# Patient Record
Sex: Female | Born: 1990
Health system: Southern US, Community
[De-identification: ages and names within clinical notes are randomized; demographics above are authoritative.]

## PROBLEM LIST (undated history)

## (undated) ENCOUNTER — Inpatient Hospital Stay (HOSPITAL_COMMUNITY): Payer: 59

## (undated) DIAGNOSIS — T7840XA Allergy, unspecified, initial encounter: Secondary | ICD-10-CM

## (undated) DIAGNOSIS — E119 Type 2 diabetes mellitus without complications: Secondary | ICD-10-CM

## (undated) HISTORY — DX: Allergy, unspecified, initial encounter: T78.40XA

## (undated) HISTORY — PX: TUBAL LIGATION: SHX77

## (undated) HISTORY — PX: FOOT SURGERY: SHX648

---
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
## 2008-03-21 ENCOUNTER — Emergency Department (HOSPITAL_COMMUNITY): Admission: EM | Admit: 2008-03-21 | Discharge: 2008-03-21 | Payer: Self-pay | Admitting: Emergency Medicine

## 2008-04-19 ENCOUNTER — Ambulatory Visit: Payer: Self-pay

## 2008-10-26 ENCOUNTER — Inpatient Hospital Stay: Payer: Self-pay

## 2010-03-21 ENCOUNTER — Encounter: Payer: Self-pay | Admitting: Family Medicine

## 2010-03-21 ENCOUNTER — Encounter: Payer: Self-pay | Admitting: Internal Medicine

## 2010-03-21 ENCOUNTER — Ambulatory Visit (INDEPENDENT_AMBULATORY_CARE_PROVIDER_SITE_OTHER): Payer: Commercial Managed Care - PPO | Admitting: Internal Medicine

## 2010-03-21 DIAGNOSIS — H66009 Acute suppurative otitis media without spontaneous rupture of ear drum, unspecified ear: Secondary | ICD-10-CM

## 2010-03-21 DIAGNOSIS — J309 Allergic rhinitis, unspecified: Secondary | ICD-10-CM | POA: Insufficient documentation

## 2010-03-24 NOTE — Letter (Signed)
Summary: Out of Work  [facility]  [address]   [place], [place] [address]   Phone: [phone]  Fax: [phone]    March 21, 2010   Employee:  SHONDELL FABEL Uhrich    To Whom It May Concern:   For Medical reasons, please excuse the above named employee from work for the following dates:  Start:   03/21/10  End:   03/23/10  If you need additional information, please feel free to contact our office.         Sincerely,    Dr. Vic Ripper

## 2010-03-24 NOTE — Assessment & Plan Note (Signed)
Summary: cold/jbb   Vital Signs:  Patient profile:   20 year old female Height:      65 inches Weight:      213 pounds BMI:     35.57 O2 Sat:      100 % on Room air Pulse rate:   88 / minute Resp:     16 per minute BP sitting:   107 / 71  (right arm)  Vitals Entered By: Adella Hare LPN (March 21, 2010 11:54 AM) CC: runny nose, colored drainage, cough,  chills, head congestion, right ear stopped up, symptoms x 1 week   CC:  runny nose, colored drainage, cough, chills, head congestion, right ear stopped up, and symptoms x 1 week.  Current Problems (verified): 1)  Peanut Allergy  (ICD-V15.01) 2)  Egg Allergy  (ICD-V15.03) 3)  Shellfish Allergy  (ICD-V15.04) 4)  Acut Suppratv Otitis Media w/o Spont Rup Eardrum  (ICD-382.00)  Current Medications (verified): 1)  Zyrtec Allergy 10 Mg Tabs (Cetirizine Hcl) .... As Needed  Allergies: 1)  * Shellfish 2)  * Eggs 3)  * Peanuts 4)  * Seafood  Past History:  Past Medical History: Allergic rhinitis  Past Surgical History: Foot surgery approx 2004  Family History: mother - healthy father- htn, prediabetes brother- healthy  Social History: Employed Single Current Smoker Alcohol use-no Drug use-no Regular exercise-no Smoking Status:  current Drug Use:  no Does Patient Exercise:  no  Review of Systems       The patient complains of fever, decreased hearing, and prolonged cough.  The patient denies anorexia, weight loss, weight gain, vision loss, hoarseness, chest pain, syncope, dyspnea on exertion, peripheral edema, headaches, hemoptysis, abdominal pain, melena, hematochezia, severe indigestion/heartburn, hematuria, incontinence, genital sores, muscle weakness, suspicious skin lesions, transient blindness, difficulty walking, depression, unusual weight change, abnormal bleeding, enlarged lymph nodes, angioedema, breast masses, and testicular masses.         right ear Eyes:  Denies blurring, discharge, double vision,  eye irritation, eye pain, halos, itching, light sensitivity, red eye, vision loss-1 eye, and vision loss-both eyes. ENT:  Complains of decreased hearing, earache, hoarseness, nasal congestion, postnasal drainage, and sore throat; denies difficulty swallowing, ear discharge, nosebleeds, ringing in ears, and sinus pressure; right ear. nasal purulence. Resp:  Complains of cough and sputum productive; denies chest discomfort, chest pain with inspiration, coughing up blood, excessive snoring, hypersomnolence, morning headaches, pleuritic, shortness of breath, and wheezing. GI:  Complains of diarrhea; denies abdominal pain, bloody stools, change in bowel habits, constipation, dark tarry stools, excessive appetite, gas, hemorrhoids, indigestion, loss of appetite, nausea, vomiting, vomiting blood, and yellowish skin color; gone today. MS:  Denies joint pain, joint redness, joint swelling, loss of strength, low back pain, mid back pain, muscle aches, muscle , cramps, muscle weakness, stiffness, and thoracic pain. Derm:  Denies changes in color of skin, changes in nail beds, dryness, excessive perspiration, flushing, hair loss, insect bite(s), itching, lesion(s), poor wound healing, and rash. Neuro:  Denies brief paralysis, difficulty with concentration, disturbances in coordination, falling down, headaches, inability to speak, memory loss, numbness, poor balance, seizures, sensation of room spinning, tingling, tremors, visual disturbances, and weakness. Psych:  Denies alternate hallucination ( auditory/visual), anxiety, depression, easily angered, easily tearful, irritability, mental problems, panic attacks, sense of great danger, suicidal thoughts/plans, thoughts of violence, unusual visions or sounds, and thoughts /plans of harming others. Allergy:  Complains of itching eyes, seasonal allergies, and sneezing; denies hives or rash and persistent infections.  Physical Exam  General:   Well-developed,well-nourished,in no acute distress; alert,appropriate and cooperative throughout examination. nasal voice Head:  Normocephalic and atraumatic without obvious abnormalities. No apparent alopecia or balding. Eyes:  No corneal or conjunctival inflammation noted. EOMI. Perrla. Vision grossly normal. Ears:  L ear normal, R TM erythema, and R TM retraction.   Nose:  mucosal erythema, mucosal edema, and airflow obstruction.   Mouth:  posterior lymphoid hypertrophy.   Neck:  No deformities, masses, or tenderness noted. Lungs:  Normal respiratory effort, chest expands symmetrically. Lungs are clear to auscultation, no crackles or wheezes. Abdomen:  soft and non-tender.   Neurologic:  No cranial nerve deficits noted. Station and gait are normal. Sensory, motor and coordinative functions appear intact. Skin:  Intact without suspicious rashes Cervical Nodes:  No lymphadenopathy noted Psych:  Cognition and judgment appear intact. Alert and cooperative with normal attention span and concentration. No apparent delusions, illusions, hallucinations   Complete Medication List: 1)  Zyrtec Allergy 10 Mg Tabs (Cetirizine hcl) .... As needed 2)  Amoxicillin 500 Mg Caps (Amoxicillin) .Marland Kitchen.. 1 by mouth three times a day. take it all  Patient Instructions: 1)  switch to zyrtec-D while sick. 2)  afrin generic 2 sprays in each nostril every 12 hours for 3 days maximum followed by 3 days of non-use. Continue cycle as needed to control nasal or ear congestion  3)  Take 650-1000mg  of Tylenol every 4-6 hours as needed for relief of pain or comfort of fever AVOID taking more than 4000mg   in a 24 hour period (can cause liver damage in higher doses). 4)  Take 400-600mg  of Ibuprofen (Advil, Motrin) with food every 4-6 hours as needed for relief of pain or comfort of fever. 5)  steam. fluids. rest. 6)  out of work for 2 days. Prescriptions: AMOXICILLIN 500 MG CAPS (AMOXICILLIN) 1 by mouth three times a day.  take it all  #30 x 0   Entered and Authorized by:   J. Juline Patch MD   Signed by:   Shela Commons. Juline Patch MD on 03/21/2010   Method used:   Electronically to        [facility] (retail)       [address]       [place], [place]  [address]       Ph: [phone]       Fax: [phone]   RxID:   [id number]

## 2010-05-12 LAB — BASIC METABOLIC PANEL
BUN: 5 mg/dL — ABNORMAL LOW (ref 6–23)
CO2: 26 mEq/L (ref 19–32)
Calcium: 9.5 mg/dL (ref 8.4–10.5)
Chloride: 102 mEq/L (ref 96–112)
Creatinine, Ser: 0.57 mg/dL (ref 0.4–1.2)
GFR calc Af Amer: >60 mL/min (ref >60)
GFR calc non Af Amer: >60 mL/min (ref >60)
Glucose, Bld: 78 mg/dL (ref 70–99)
Potassium: 3.8 mEq/L (ref 3.5–5.1)
Sodium: 135 mEq/L (ref 135–145)

## 2010-05-12 LAB — POCT CARDIAC MARKERS
CKMB, poc: <1 ng/mL — ABNORMAL LOW (ref 1.0–8.0)
Myoglobin, poc: 30.1 ng/mL (ref 12–200)
Troponin i, poc: <0.05 ng/mL (ref 0.00–0.09)

## 2010-05-12 LAB — PROTIME-INR
INR: 1 (ref 0.00–1.49)
Prothrombin Time: 13.7 seconds (ref 11.6–15.2)

## 2010-05-12 LAB — APTT: aPTT: 30 seconds (ref 24–37)

## 2010-05-12 LAB — CBC
HCT: 41.3 % (ref 36.0–46.0)
Hemoglobin: 13.9 g/dL (ref 12.0–15.0)
MCHC: 33.6 g/dL (ref 30.0–36.0)
MCV: 92.5 fL (ref 78.0–100.0)
Platelets: 260 10*3/uL (ref 150–400)
RBC: 4.47 MIL/uL (ref 3.87–5.11)
RDW: 13.6 % (ref 11.5–15.5)
WBC: 16.6 10*3/uL — ABNORMAL HIGH (ref 4.0–10.5)

## 2010-05-12 LAB — DIFFERENTIAL
Basophils Absolute: 0 10*3/uL (ref 0.0–0.1)
Basophils Relative: 0 % (ref 0–1)
Eosinophils Absolute: 0.1 10*3/uL (ref 0.0–0.7)
Eosinophils Relative: 1 % (ref 0–5)
Lymphocytes Relative: 12 % (ref 12–46)
Lymphs Abs: 2 10*3/uL (ref 0.7–4.0)
Monocytes Absolute: 0.9 10*3/uL (ref 0.1–1.0)
Monocytes Relative: 5 % (ref 3–12)
Neutro Abs: 13.6 10*3/uL — ABNORMAL HIGH (ref 1.7–7.7)
Neutrophils Relative %: 82 % — ABNORMAL HIGH (ref 43–77)

## 2010-05-12 LAB — HCG, SERUM, QUALITATIVE: Preg, Serum: POSITIVE — AB

## 2010-05-12 LAB — D-DIMER, QUANTITATIVE: D-Dimer, Quant: 0.49 ug/mL-FEU — ABNORMAL HIGH (ref 0.00–0.48)

## 2010-05-12 LAB — PREGNANCY, URINE: Preg Test, Ur: POSITIVE

## 2010-05-12 LAB — HCG, QUANTITATIVE, PREGNANCY: hCG, Beta Chain, Quant, S: 129498 m[IU]/mL — ABNORMAL HIGH (ref <5)

## 2011-02-11 ENCOUNTER — Emergency Department (HOSPITAL_COMMUNITY)
Admission: EM | Admit: 2011-02-11 | Discharge: 2011-02-11 | Disposition: A | Discharge status: Home / Self Care | Payer: 59 | Attending: Emergency Medicine | Admitting: Emergency Medicine

## 2011-02-11 ENCOUNTER — Encounter (HOSPITAL_COMMUNITY): Payer: Self-pay | Admitting: Emergency Medicine

## 2011-02-11 DIAGNOSIS — M545 Low back pain, unspecified: Secondary | ICD-10-CM | POA: Insufficient documentation

## 2011-02-11 DIAGNOSIS — X503XXA Overexertion from repetitive movements, initial encounter: Secondary | ICD-10-CM | POA: Insufficient documentation

## 2011-02-11 DIAGNOSIS — N390 Urinary tract infection, site not specified: Secondary | ICD-10-CM | POA: Insufficient documentation

## 2011-02-11 LAB — URINALYSIS, ROUTINE W REFLEX MICROSCOPIC
Bilirubin Urine: NEGATIVE
Glucose, UA: NEGATIVE mg/dL
Ketones, ur: NEGATIVE mg/dL
Nitrite: NEGATIVE
Specific Gravity, Urine: >1.03 — ABNORMAL HIGH (ref 1.005–1.030)
Urobilinogen, UA: 0.2 mg/dL (ref 0.0–1.0)
pH: 5.5 (ref 5.0–8.0)

## 2011-02-11 LAB — URINE CULTURE
Colony Count: 30000
Culture  Setup Time: 201301171210

## 2011-02-11 LAB — URINE MICROSCOPIC-ADD ON

## 2011-02-11 LAB — POCT PREGNANCY, URINE: Preg Test, Ur: NEGATIVE

## 2011-02-11 MED ORDER — CEPHALEXIN 500 MG PO CAPS
500.0000 mg | ORAL_CAPSULE | Freq: Once | ORAL | Status: AC
  Administered 2011-02-11: 500 mg via ORAL
  Filled 2011-02-11: qty 1

## 2011-02-11 MED ORDER — CEPHALEXIN 500 MG PO CAPS: 500.0000 mg | ORAL_CAPSULE | Freq: Four times a day (QID) | ORAL | Status: AC

## 2011-02-11 NOTE — ED Provider Notes (Signed)
Medical screening examination/treatment/procedure(s) were performed by non-physician practitioner and as supervising physician I was immediately available for consultation/collaboration.  Donnetta Hutching, MD 02/11/11 1453

## 2011-02-11 NOTE — ED Provider Notes (Signed)
History     CSN: [id number]  Arrival date & time 02/11/11  4098   First MD Initiated Contact with Patient 02/11/11 0820      Chief Complaint  Patient presents with  . Back Pain    (Consider location/radiation/quality/duration/timing/severity/associated sxs/prior treatment) HPI Comments: Patient c/o aching pain to her lower back that began yesterday.  She does some heavy lifting and bending at work and she thinks she may have "pulled something" in her back while lifting.  She denies numbness, incontinence, weakness or urinary sx's.  Patient is a 21 y.o. female presenting with back pain. The history is provided by the patient. No language interpreter was used.  Back Pain  This is a new problem. The current episode started yesterday. The problem occurs constantly. The problem has not changed since onset.The pain is associated with lifting heavy objects. The pain is present in the lumbar spine. The quality of the pain is described as aching. Radiates to: bilateral buttocks. The pain is moderate. The symptoms are aggravated by bending, twisting and certain positions. The pain is the same all the time. Pertinent negatives include no fever, no numbness, no abdominal pain, no abdominal swelling, no bowel incontinence, no perianal numbness, no bladder incontinence, no dysuria, no pelvic pain, no leg pain, no paresthesias, no paresis, no tingling and no weakness. She has tried NSAIDs for the symptoms. The treatment provided mild relief.    History reviewed. No pertinent past medical history.  Past Surgical History  Procedure Date  . Foot surgery   . Cesarean section     Family History  Problem Relation Age of Onset  . Diabetes Father   . Heart failure Other     History  Substance Use Topics  . Smoking status: Never Smoker   . Smokeless tobacco: Never Used  . Alcohol Use: No    OB History    Grav Para Term Preterm Abortions TAB SAB Ect Mult Living   1 1 1       1       Review of  Systems  Constitutional: Negative for fever.  Gastrointestinal: Negative for nausea, vomiting, abdominal pain and bowel incontinence.  Genitourinary: Negative for bladder incontinence, dysuria, frequency, hematuria, flank pain, vaginal bleeding, vaginal discharge, difficulty urinating, vaginal pain and pelvic pain.  Musculoskeletal: Positive for back pain.  Skin: Negative.   Neurological: Negative for tingling, weakness, numbness and paresthesias.  All other systems reviewed and are negative.    Allergies  Chocolate; Eggs or egg-derived products; Peanut-containing drug products; and Shellfish allergy  Home Medications  No current outpatient prescriptions on file.  BP 131/75  Pulse 98  Temp(Src) 98.4 F (36.9 C) (Oral)  Resp 16  Ht 5\' 5"  (1.651 m)  Wt 202 lb (91.627 kg)  BMI 33.61 kg/m2  SpO2 100%  Physical Exam  Nursing note and vitals reviewed. Constitutional: She is oriented to person, place, and time. She appears well-developed and well-nourished. No distress.  HENT:  Head: Normocephalic and atraumatic.  Mouth/Throat: Oropharynx is clear and moist. No oropharyngeal exudate.  Cardiovascular: Normal rate, regular rhythm and normal heart sounds.   No murmur heard. Pulmonary/Chest: Effort normal and breath sounds normal. No respiratory distress.  Abdominal: Soft. Normal appearance. She exhibits no distension. There is no tenderness. There is no rigidity, no rebound, no guarding and no CVA tenderness.  Musculoskeletal: She exhibits tenderness. She exhibits no edema.       Lumbar back: She exhibits tenderness. She exhibits normal range of motion, no  bony tenderness, no swelling, no edema, no deformity, no spasm and normal pulse.       Back:  Neurological: She is alert and oriented to person, place, and time. She has normal strength. No cranial nerve deficit or sensory deficit. Gait normal.  Reflex Scores:      Patellar reflexes are 2+ on the right side and 2+ on the left  side.      Achilles reflexes are 2+ on the right side and 2+ on the left side. Skin: Skin is warm and dry.    ED Course  Procedures (including critical care time)   Results for orders placed during the hospital encounter of 02/11/11  URINALYSIS, ROUTINE W REFLEX MICROSCOPIC      Component Value Range   Color, Urine YELLOW  YELLOW    APPearance CLEAR  CLEAR    Specific Gravity, Urine >1.030 (*) 1.005 - 1.030    pH 5.5  5.0 - 8.0    Glucose, UA NEGATIVE  NEGATIVE (mg/dL)   Hgb urine dipstick SMALL (*) NEGATIVE    Bilirubin Urine NEGATIVE  NEGATIVE    Ketones, ur NEGATIVE  NEGATIVE (mg/dL)   Protein, ur TRACE (*) NEGATIVE (mg/dL)   Urobilinogen, UA 0.2  0.0 - 1.0 (mg/dL)   Nitrite NEGATIVE  NEGATIVE    Leukocytes, UA MODERATE (*) NEGATIVE   POCT PREGNANCY, URINE      Component Value Range   Preg Test, Ur NEGATIVE    URINE MICROSCOPIC-ADD ON      Component Value Range   Squamous Epithelial / LPF FEW (*) RARE    WBC, UA 21-50  <3 (WBC/hpf)   Bacteria, UA MANY (*) RARE      Urine culture is pending   MDM     Pt has ttp of the lumbar paraspinal muscles.  No focal neuro deficits on exam.  Ambulates well.  Vitals are stable.  No fever, vomiting or CVA tenderness on exam to suggest pyleo.  I will start keflex and she agrees to return here if sx's worsen  Patient / Family / Caregiver understand and agree with initial ED impression and plan with expectations set for ED visit.   Pt stable in ED with no significant deterioration in condition.     Shane Melby L. Keeler, [place] 02/11/11 [phone]

## 2011-02-11 NOTE — ED Notes (Signed)
Patient c/o mid-lower back pain that started yesterday. Denies any known injuries.

## 2011-02-11 NOTE — ED Notes (Signed)
Patient with no complaints at this time. Respirations even and unlabored. Skin warm/dry. Discharge instructions reviewed with patient at this time. Patient given opportunity to voice concerns/ask questions. Patient discharged at this time and left Emergency Department with steady gait.   

## 2013-01-30 ENCOUNTER — Emergency Department: Payer: Self-pay | Admitting: Emergency Medicine

## 2013-01-30 LAB — BASIC METABOLIC PANEL
Anion Gap: 6 — ABNORMAL LOW (ref 7–16)
BUN: 9 mg/dL (ref 7–18)
Calcium, Total: 9.2 mg/dL (ref 8.5–10.1)
Chloride: 104 mmol/L (ref 98–107)
Co2: 27 mmol/L (ref 21–32)
Creatinine: 0.8 mg/dL (ref 0.60–1.30)
EGFR (African American): >60
EGFR (Non-African Amer.): >60
Glucose: 67 mg/dL (ref 65–99)
Osmolality: 271 (ref 275–301)
Potassium: 3.8 mmol/L (ref 3.5–5.1)
Sodium: 137 mmol/L (ref 136–145)

## 2013-01-30 LAB — CBC
HCT: 39 % (ref 35.0–47.0)
HGB: 13.2 g/dL (ref 12.0–16.0)
MCH: 30.3 pg (ref 26.0–34.0)
MCHC: 33.8 g/dL (ref 32.0–36.0)
MCV: 90 fL (ref 80–100)
Platelet: 273 10*3/uL (ref 150–440)
RBC: 4.35 10*6/uL (ref 3.80–5.20)
RDW: 13.7 % (ref 11.5–14.5)
WBC: 17.4 10*3/uL — ABNORMAL HIGH (ref 3.6–11.0)

## 2013-01-30 LAB — TROPONIN I: Troponin-I: <0.02 ng/mL

## 2013-01-31 LAB — URINALYSIS, COMPLETE
Bacteria: NONE SEEN
Bilirubin,UR: NEGATIVE
Glucose,UR: NEGATIVE mg/dL (ref 0–75)
Nitrite: NEGATIVE
Ph: 5 (ref 4.5–8.0)
Protein: NEGATIVE
RBC,UR: 116 /HPF (ref 0–5)
Specific Gravity: 1.024 (ref 1.003–1.030)
Squamous Epithelial: 2
WBC UR: 152 /HPF (ref 0–5)

## 2013-01-31 LAB — CK-MB
CK-MB: 0.8 ng/mL (ref 0.5–3.6)
CK-MB: <0.5 ng/mL — ABNORMAL LOW (ref 0.5–3.6)
CK-MB: <0.5 ng/mL — ABNORMAL LOW (ref 0.5–3.6)

## 2013-01-31 LAB — HCG, QUANTITATIVE, PREGNANCY: Beta Hcg, Quant.: <1 m[IU]/mL — ABNORMAL LOW

## 2013-01-31 LAB — TROPONIN I
Troponin-I: <0.02 ng/mL
Troponin-I: <0.02 ng/mL

## 2013-11-26 ENCOUNTER — Encounter (HOSPITAL_COMMUNITY): Payer: Self-pay | Admitting: Emergency Medicine

## 2014-05-18 NOTE — H&P (Signed)
PATIENT NAME:  Dara HoyerSHE, Dorice L MR#:  [id number] DATE OF BIRTH:  04-Sep-1990  DATE OF ADMISSION:  01/30/2013  REFERRING PHYSICIAN: Dr. Sharyn CreamerMark Quale.   PRIMARY CARE PHYSICIAN: Dr. Corky DownsJaved Masoud.   CHIEF COMPLAINT: Chest pain, shortness of breath.   HISTORY OF PRESENT ILLNESS: This is 24 year old female without significant past medical history who presents with complaints of pleuritic chest pain. The patient reported her symptoms started 3 days ago. Reports they were of sudden onset, intermittent episodes lasting for seconds. As well, it was accompanied by some mild shortness of breath. The patient reports it is worsened by deep inspiration and by ambulation as well. The patient reports she had recently an upper respiratory infection which currently is resolved. Upon presentation, the patient did not have any hypoxic but was tachycardic around 112. EKG did not show an acute findings. Her first troponin was negative. The patient had D-dimer sent which came back elevated at 2.92. The patient reports an allergy to SEAFOOD which is anaphylaxis, so the patient could not have CT chest with IV contrast. The patient had bilateral lower extremity venous Doppler which came back negative for DVT. The patient denies any hemoptysis, any recent long travel, any calf tenderness, but given the fact she had an elevated D-dimer, hospitalist service was requested to admit the patient to get a V/Q scan done in the a.m. to rule out PE. The patient had leukocytosis at 17.4 but she denies any fever, any dysuria, any cough, any productive sputum.   PAST MEDICAL HISTORY: None.   PAST SURGICAL HISTORY: Right foot surgery.   ALLERGIES:  1. Reports allergy to SEAFOOD including anaphylaxis.  2. IODINE.   HOME MEDICATIONS: Taking Tylenol as needed, Advil as needed. The patient is on contraception with Implanon.   FAMILY HISTORY: The patient denies any history of PE or blood clots in the family.   SOCIAL HISTORY: The patient  denies any smoking, any illicit drug use, any alcohol use.   REVIEW OF SYSTEMS:  CONSTITUTIONAL: Denies fever, chills, fatigue, weakness, weight gain, weight loss.  EYES: Denies blurry vision, double vision, inflammation, glaucoma.  EARS, NOSE, THROAT: Denies tinnitus, ear pain, hearing loss, epistaxis or discharge.  RESPIRATORY: Denies any cough, wheezing, hemoptysis. Complains of mild shortness of breath.  CARDIOVASCULAR: Complains of pleuritic chest pain. Denies any syncope, palpitation.  GASTROINTESTINAL: Denies nausea, vomiting, diarrhea, abdominal pain, hematemesis, melena, jaundice.  GENITOURINARY: Denies dysuria, hematuria, renal colic.  ENDOCRINE: Denies polyuria, polydipsia, heat or cold intolerance.  HEMATOLOGY: Denies anemia, easy bruising, bleeding diathesis.  INTEGUMENT: Denies acne, rash or skin lesion.  MUSCULOSKELETAL: Denies any swelling, gout, arthritis or cramps.  NEUROLOGIC: Denies CVA, TIA, seizures, headache.  PSYCHIATRIC: Denies anxiety, insomnia, bipolar disorder or schizophrenia.   PHYSICAL EXAMINATION:  VITAL SIGNS: Temperature 98.3, pulse 100, respiratory rate 20, blood pressure 91/48, saturating 99% on room air.  GENERAL: Obese female, looks comfortable, in no apparent distress.  HEENT: Head atraumatic, normocephalic. Pupils equal, reactive to light. Pink conjunctivae. Anicteric sclerae. Moist oral mucosa.  NECK: Supple. No thyromegaly. No JVD.  CHEST: Good air entry bilaterally. No wheezing, rales, rhonchi. Has tenderness to palpation in the right rib cage area.  CARDIOVASCULAR: S1, S2 heard. No rubs, murmurs or gallops.  ABDOMEN: Soft, nontender, nondistended. Bowel sounds present.  EXTREMITIES: No edema. No clubbing. No cyanosis. Pedal pulses felt bilaterally.  PSYCHIATRIC: Appropriate affect. Awake, alert x 3. Intact judgment and insight.  NEUROLOGIC: Cranial nerves grossly intact. Motor 5 out of 5. No focal deficits.  LYMPHATICS: No cervical  lymphadenopathy could be appreciated.  SKIN: Has a few skin tattoos but warm and dry. Normal skin turgor.   PERTINENT LABORATORIES: Urine pregnancy test negative. Glucose 67, BUN 9, creatinine 0.8, sodium 137, potassium 3.8, chloride 104, CO2 27. Troponin less than 0.02. White blood cells 17.4, hemoglobin 13.2, hematocrit 39, platelets 273. D-dimer is 2.92.   EKG showing normal sinus rhythm at 112 beats per minute.   Chest x-ray showing slight bibasilar atelectatic changes. Lungs otherwise clear.   Venous Doppler bilateral lower extremities: No DVT within the bilateral lower extremities.   ASSESSMENT AND PLAN:  1. Pleuritic chest pain: This is most likely related to musculoskeletal pain, but given the fact the patient has elevated D-dimer, will need to rule out pulmonary embolus. She cannot get CT chest angiogram due to her allergy, so she will need to have a V/Q scan done in the a.m. Meanwhile, will give her a treatment dose Lovenox x 1 of 1 mg/kg. Will continue to cycle her cardiac enzymes. As well, will monitor her on telemetry.  2. Leukocytosis: The patient is afebrile. No clear etiology. Chest x-ray does not show any pneumonia. Will check urinalysis.  3. Deep vein thrombosis prophylaxis: The patient received 1 treatment dose of Lovenox.   TOTAL TIME SPENT ON ADMISSION AND PATIENT CARE: 40 minutes.   ____________________________ Starleen Arms, MD dse:gb D: 01/31/2013 01:27:52 ET T: 01/31/2013 02:12:19 ET JOB#: [id number]  cc: Starleen Arms, MD, <Dictator> Tannon Peerson Teena Irani MD ELECTRONICALLY SIGNED 02/01/2013 14:27

## 2014-05-18 NOTE — Discharge Summary (Signed)
PATIENT NAME:  Cheryl Carpenter, Tionne L MR#:  [id number] DATE OF BIRTH:  07-25-1990  DATE OF ADMISSION:  01/30/2013 DATE OF DISCHARGE:  01/31/2013  ADMITTING PHYSICIAN: Starleen Armsawood S. Elgergawy, MD  DISCHARGING PHYSICIAN: Enid Baasadhika Myley Bahner, MD   PRIMARY CARE PHYSICIAN:  Corky DownsJaved Masoud, MD  DISCHARGE DIAGNOSES: 1.  Musculoskeletal chest pain/costochondritis.  2.  Nonspecific elevation of d-dimer.  3.  Asymptomatic pyuria.  4.  Obesity.   DISCHARGE HOME MEDICATIONS: Tylenol 650 mg q.4 hours as needed for pain.   DISCHARGE DIET: Regular diet.   DISCHARGE ACTIVITY: As tolerated.    FOLLOWUP INSTRUCTIONS:  PCP followup in 2 to 3 weeks.   LABS AND IMAGING STUDIES PRIOR TO DISCHARGE:  1.  WBC 17.4, hemoglobin 13.3, hematocrit 39.0, platelet count 273.  2.  Sodium 137, potassium 3.8, chloride 104, bicarb 27, BUN 9, creatinine 0.8, glucose 67 and calcium of 9.2.  3.  Urine pregnancy test is negative.  4.  Troponins are negative.  5.  Chest x-ray showing bibasilar atelectatic changes, otherwise clear lung fields.  6.  Ultrasound Doppler of the lower extremities bilaterally showing no evidence of any DVT. 7.  Urinalysis with 3+ leuk esterase but no bacteria seen at all.  8.  V/Q scan of the lungs showing normal study with no significant ventilation perfusion defects.   BRIEF HOSPITAL COURSE:  1.  Cheryl Carpenter is a 24 year old obese African American female with no significant past medical history, presents secondary to right-sided under the breast chest pain which was worsening by moving around. 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 She had troponins done which were normal in the ER x 3. EKG was normal and was thought to be either musculoskeletal chest pain versus PE as her d-dimer was slightly elevated. Because of her ALLERGY TO IODINE, the patient had a V/Q scan done which was completely normal and negative for any PE. The patient did not require any pain  medication in the ER other than 1 dose of Toradol when she initially presented and has remained pain-free. She was advised to take off from work for a day and was advised to use either Tylenol or Motrin over-the-counter for her pain.   2. Asymptomatic pyuria. She had increased WBCs and leuk esterase on urinalysis but no bacteria was noted. Because she was not symptomatic with any dysuria or discomfort, no antibiotics are being prescribed at this time. In case she develops any dysuria or fevers, UTI needs to be considered.   Her course has been otherwise uneventful in the hospital.   DISCHARGE CONDITION: Stable.   DISCHARGE DISPOSITION: Home.   TIME SPENT ON DISCHARGE: 45 minutes.   ____________________________ Enid Baasadhika Janari Gagner, MD rk:cs D: 01/31/2013 15:52:00 ET T: 01/31/2013 20:00:38 ET JOB#: [id number]  cc: Enid Baasadhika Velvia Mehrer, MD, <Dictator> Corky DownsJaved Masoud, MD Enid BaasADHIKA Tylan Briguglio MD ELECTRONICALLY SIGNED 02/06/2013 11:26

## 2015-01-31 DIAGNOSIS — Z308 Encounter for other contraceptive management: Secondary | ICD-10-CM | POA: Diagnosis not present

## 2015-03-12 ENCOUNTER — Encounter: Payer: Self-pay | Admitting: Emergency Medicine

## 2015-03-12 DIAGNOSIS — R61 Generalized hyperhidrosis: Secondary | ICD-10-CM | POA: Diagnosis not present

## 2015-03-12 DIAGNOSIS — Z3202 Encounter for pregnancy test, result negative: Secondary | ICD-10-CM | POA: Insufficient documentation

## 2015-03-12 DIAGNOSIS — R000 Tachycardia, unspecified: Secondary | ICD-10-CM | POA: Diagnosis not present

## 2015-03-12 DIAGNOSIS — N390 Urinary tract infection, site not specified: Secondary | ICD-10-CM | POA: Diagnosis not present

## 2015-03-12 DIAGNOSIS — J029 Acute pharyngitis, unspecified: Secondary | ICD-10-CM | POA: Diagnosis present

## 2015-03-12 LAB — POCT RAPID STREP A: Streptococcus, Group A Screen (Direct): NEGATIVE

## 2015-03-12 NOTE — ED Notes (Signed)
C/o sorethroat, chills, fever.  Onset of symptoms today.  Ibuprofen last taken at 1430 and tylenol at 2215.

## 2015-03-13 ENCOUNTER — Emergency Department
Admission: EM | Admit: 2015-03-13 | Discharge: 2015-03-13 | Discharge status: Against Medical Advice | Payer: 59 | Attending: Emergency Medicine | Admitting: Emergency Medicine

## 2015-03-13 DIAGNOSIS — R61 Generalized hyperhidrosis: Secondary | ICD-10-CM | POA: Diagnosis not present

## 2015-03-13 DIAGNOSIS — N390 Urinary tract infection, site not specified: Secondary | ICD-10-CM | POA: Diagnosis not present

## 2015-03-13 DIAGNOSIS — R000 Tachycardia, unspecified: Secondary | ICD-10-CM | POA: Diagnosis not present

## 2015-03-13 DIAGNOSIS — Z3202 Encounter for pregnancy test, result negative: Secondary | ICD-10-CM | POA: Diagnosis not present

## 2015-03-13 LAB — URINALYSIS COMPLETE WITH MICROSCOPIC (ARMC ONLY)
Bilirubin Urine: NEGATIVE
Glucose, UA: NEGATIVE mg/dL
Ketones, ur: NEGATIVE mg/dL
Nitrite: NEGATIVE
Protein, ur: 30 mg/dL — AB
Specific Gravity, Urine: 1.025 (ref 1.005–1.030)
pH: 5 (ref 5.0–8.0)

## 2015-03-13 LAB — BASIC METABOLIC PANEL
Anion gap: 8 (ref 5–15)
BUN: 8 mg/dL (ref 6–20)
CO2: 24 mmol/L (ref 22–32)
Calcium: 9 mg/dL (ref 8.9–10.3)
Chloride: 106 mmol/L (ref 101–111)
Creatinine, Ser: 0.71 mg/dL (ref 0.44–1.00)
GFR calc Af Amer: >60 mL/min (ref >60)
GFR calc non Af Amer: >60 mL/min (ref >60)
Glucose, Bld: 85 mg/dL (ref 65–99)
Potassium: 3.3 mmol/L — ABNORMAL LOW (ref 3.5–5.1)
Sodium: 138 mmol/L (ref 135–145)

## 2015-03-13 LAB — RAPID INFLUENZA A&B ANTIGENS
Influenza A (ARMC): NOT DETECTED
Influenza B (ARMC): NOT DETECTED

## 2015-03-13 LAB — CBC
HCT: 41.8 % (ref 35.0–47.0)
Hemoglobin: 13.8 g/dL (ref 12.0–16.0)
MCH: 30 pg (ref 26.0–34.0)
MCHC: 33 g/dL (ref 32.0–36.0)
MCV: 91 fL (ref 80.0–100.0)
Platelets: 212 10*3/uL (ref 150–440)
RBC: 4.59 MIL/uL (ref 3.80–5.20)
RDW: 13.5 % (ref 11.5–14.5)
WBC: 18.2 10*3/uL — ABNORMAL HIGH (ref 3.6–11.0)

## 2015-03-13 LAB — PREGNANCY, URINE: Preg Test, Ur: NEGATIVE

## 2015-03-13 LAB — LACTIC ACID, PLASMA: Lactic Acid, Venous: 0.6 mmol/L (ref 0.5–2.0)

## 2015-03-13 MED ORDER — ONDANSETRON HCL 4 MG/2ML IJ SOLN
4.0000 mg | Freq: Once | INTRAMUSCULAR | Status: AC
  Administered 2015-03-13: 4 mg via INTRAVENOUS

## 2015-03-13 MED ORDER — SODIUM CHLORIDE 0.9 % IV BOLUS (SEPSIS)
1000.0000 mL | Freq: Once | INTRAVENOUS | Status: AC
Start: 2015-03-13 — End: 2015-03-13
  Administered 2015-03-13: 1000 mL via INTRAVENOUS

## 2015-03-13 MED ORDER — ONDANSETRON 4 MG PO TBDP: 4.0000 mg | ORAL_TABLET | Freq: Four times a day (QID) | ORAL | Status: DC | PRN

## 2015-03-13 MED ORDER — CEPHALEXIN 500 MG PO CAPS: 500.0000 mg | ORAL_CAPSULE | Freq: Four times a day (QID) | ORAL | Status: DC

## 2015-03-13 MED ORDER — DEXTROSE 5 % IV SOLN
1.0000 g | Freq: Once | INTRAVENOUS | Status: AC
  Administered 2015-03-13: 1 g via INTRAVENOUS
  Filled 2015-03-13: qty 10

## 2015-03-13 MED ORDER — ONDANSETRON HCL 4 MG/2ML IJ SOLN
INTRAMUSCULAR | Status: AC
  Administered 2015-03-13: 4 mg via INTRAVENOUS
  Filled 2015-03-13: qty 2

## 2015-03-13 MED ORDER — IBUPROFEN 800 MG PO TABS
800.0000 mg | ORAL_TABLET | ORAL | Status: AC
  Administered 2015-03-13: 800 mg via ORAL
  Filled 2015-03-13: qty 1

## 2015-03-13 NOTE — ED Notes (Signed)
Pt left ama, form signed.  MD aware, patient encouraged to return with any problems.

## 2015-03-13 NOTE — ED Provider Notes (Signed)
[facility] Emergency Department Provider Note  ____________________________________________  Time seen: Approximately 1:28 AM  I have reviewed the triage vital signs and the nursing notes.   HISTORY  Chief Complaint Fever and Sore Throat    HPI Cheryl Carpenter is a 25 y.o. female percents for evaluation of fevers chills, slightly scratchy throat that started about 8:00 this morning. She did take Profen during the day, and then took a dose of Tylenol this evening around 8 PM.  Patient reports she continues to feel the scratchy throat, slight runny nose, slightly achy in the muscles, has a minimal dry cough but no shortness of breath. No chest pain. No nausea or vomiting. She does report fever and chills. Denies any urinary symptoms. Denies pregnancy. She feels as though she is coming out of the "virus". 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 He did receive a flu shot this year.   History reviewed. No pertinent past medical history.  Patient Active Problem List   Diagnosis Date Noted  . ACUT SUPPRATV OTITIS MEDIA W/O SPONT RUP EARDRUM 03/21/2010  . ALLERGIC RHINITIS 03/21/2010  . PEANUT ALLERGY 03/21/2010  . EGG ALLERGY 03/21/2010  . SHELLFISH ALLERGY 03/21/2010    Past Surgical History  Procedure Laterality Date  . Foot surgery    . Cesarean section      Current Outpatient Rx  Name  Route  Sig  Dispense  Refill  . cephALEXin (KEFLEX) 500 MG capsule   Oral   Take 1 capsule (500 mg total) by mouth 4 (four) times daily.   40 capsule   0   . ondansetron (ZOFRAN ODT) 4 MG disintegrating tablet   Oral   Take 1 tablet (4 mg total) by mouth every 6 (six) hours as needed for nausea or vomiting.   20 tablet   0     Allergies Chocolate; Eggs or egg-derived products; Peanut-containing drug products; and Shellfish allergy  Family History  Problem Relation Age of Onset  . Diabetes Father   . Heart failure Other     Social  History Social History  Substance Use Topics  . Smoking status: Never Smoker   . Smokeless tobacco: Never Used  . Alcohol Use: No    Review of Systems Constitutional: Fevers and chills Eyes: No visual changes. ENT: Slight scratchy throat Cardiovascular: Denies chest pain. Respiratory: Denies shortness of breath. Occasional dry cough. Gastrointestinal: No abdominal pain. Nausea and slight vomiting.  No diarrhea.  No constipation. Genitourinary: Some slight urgency. Denies pregnancy. Musculoskeletal: Negative for back pain. Skin: Negative for rash. Neurological: Negative for headaches, focal weakness or numbness.  10-point ROS otherwise negative.  ____________________________________________   PHYSICAL EXAM:  VITAL SIGNS: ED Triage Vitals  Enc Vitals Group     BP 03/12/15 2249 129/78 mmHg     Pulse Rate 03/12/15 2249 126     Resp 03/12/15 2249 18     Temp 03/12/15 2249 100.8 F (38.2 C)     Temp Source 03/12/15 2249 Oral     SpO2 03/12/15 2249 100 %     Weight 03/12/15 2249 220 lb (99.791 kg)     Height 03/12/15 2249 5\' 5"  (1.651 m)     Head Cir --      Peak Flow --      Pain Score 03/12/15 2250 3     Pain Loc --      Pain Edu? --      Excl. in GC? --  Constitutional: Alert and oriented. Mildly ill appearing, slightly diaphoretic but in no distress. She is very pleasant and amicable. Watching television in no distress. Eyes: Conjunctivae are normal. PERRL. EOMI. Head: Atraumatic. Nose: No congestion/rhinnorhea. Mouth/Throat: Mucous membranes are moist.  Oropharynx non-erythematous. No tonsillar injection or hypertrophy. No trouble swallowing. Neck: No stridor.  No meningismus. Cardiovascular: Tachycardic rate, regular rhythm. Grossly normal heart sounds.  Good peripheral circulation. (Of note patient does report to me that her heart rate is always elevated whenever she goes to see the doctor.) Respiratory: Normal respiratory effort.  No retractions. Lungs  CTAB. Gastrointestinal: Soft and nontender. Mild suprapubic discomfort. No pain at McBurney's point. No CVA tenderness. Musculoskeletal: No lower extremity tenderness nor edema.   Neurologic:  Normal speech and language. No gross focal neurologic deficits are appreciated. Skin:  Skin is warm, dry and intact. No rash noted. Psychiatric: Mood and affect are normal. Speech and behavior are normal.  ____________________________________________   LABS (all labs ordered are listed, but only abnormal results are displayed)  Labs Reviewed  URINALYSIS COMPLETEWITH MICROSCOPIC ([facility] ONLY) - Abnormal; Notable for the following:    Color, Urine YELLOW (*)    APPearance HAZY (*)    Hgb urine dipstick 2+ (*)    Protein, ur 30 (*)    Leukocytes, UA 3+ (*)    Bacteria, UA FEW (*)    Squamous Epithelial / LPF 0-5 (*)    All other components within normal limits  BASIC METABOLIC PANEL - Abnormal; Notable for the following:    Potassium 3.3 (*)    All other components within normal limits  CBC - Abnormal; Notable for the following:    WBC 18.2 (*)    All other components within normal limits  RAPID INFLUENZA A&B ANTIGENS ([facility] ONLY)  CULTURE, GROUP A STREP ([facility])  CULTURE, BLOOD (ROUTINE X 2)  CULTURE, BLOOD (ROUTINE X 2)  URINE CULTURE  LACTIC ACID, PLASMA  LACTIC ACID, PLASMA  PREGNANCY, URINE  POCT RAPID STREP A   ____________________________________________  EKG   ____________________________________________  RADIOLOGY   ____________________________________________   PROCEDURES  Procedure(s) performed: None  Critical Care performed: No  ____________________________________________   INITIAL IMPRESSION / ASSESSMENT AND PLAN / ED COURSE  Pertinent labs & imaging results that were available during my care of the patient were reviewed by me and considered in my medical decision making (see chart for details).  Patient presents for fever, nausea, vomiting and associated  infectious symptoms. She is notably tachycardic, and labs reveal leukocytosis and she has associated low-grade fever. The patient's urinalysis is concerning for possible urinary tract infection, based upon this I discussed with the patient and recommended strongly admission for exclusion of "sepsis" or "a life-threatening infection". The patient reports she feels much better, after discussing the benefits of hospitalization and my strong recommendation to stay to be admitted patient did refuse this. Though she does refuse, I will certainly provide her appropriate antibiotic prescription and Zofran. We discussed in detail risks and benefits of hospitalization and the patient will be discharged AMA. She does have a primary care doctor whom she'll follow up closely with.   Careful return precautions advised.  03/13/2015 at 3:37 AM:  The patient requested to leave.  I considered this to be leaving against medical advice. I personally discussed the following with them:  1)  That they currently had a medical condition of infection with fevers and I am concerned that they may have a "life-threatening bloodstream infection," sometimes referred to as "  sepsis".Marland Kitchen    2)  My proposed course of evaluation and treatment includes, but is not limited to,  admission for IV antibiotics, blood cultures and close monitoring and aggressive treatment for infection.  Benefits of staying include possible diagnosis or excluding of a bloodstream infection or an alternative serious condition such as sepsis, which if identified early would lead to appropriate intervention in a timely manner lessening the burden of disability and death.  3) Risks of leaving before this had been completed include: misdiagnosis, worsening illness leading up to and including prolonged or permanent disability or death.  Specific risks pertinent, but not all inclusive, of their current medical condition include but are not limited to death, overwhelming  infection.  I also discussed alternatives including additional fluids and observation in the emergency room, but the patient also refused this.  Despite this they stated they wanted to leave due to feeling much improved and not wishing to be hospitalized and refused further evaluation, treatment, or admission at this time. Again I strongly encouraged admission, but she appears to have the capacity after discussion of risks including death from bloodstream infection or severe viral infection.  They appeared clinically sober, were mentating appropriately, were free from distracting injury, had adequately controlled acute pain, appeared to have intact insight, judgment, and reason, and in my opinion had the capacity to make this decision.  Specifically, they were able to verbally state back in a coherent manner their current medical condition/current diagnosis, the proposed course of evaluation and/or treatment, and the risks, benefits, and alternatives of treatment versus leaving against medical advice.   They understand that they may return to seek medical attention here at ANY time they want.  I strongly advised them to return to the Emergency Department immediately if they experience any new or worsening symptoms that concern them, or simply if they reconsider continued evaluation and/or treatment as previously discussed.  This would be without any repercussions, though they understand they likely will need to wait again in the Emergency Department if other patients are in front of them, rather than being brought straight back.  They understood this is another advantage of staying, but still insisted upon leaving.  I recommended they follow-up with Dr. Harl Bowie (her Primary Doctor) at the earliest available opportunity/appointment for further evaluation and treatment.   The patient was discharged against medical advice.  They did accept written discharge  instructions.   ____________________________________________   FINAL CLINICAL IMPRESSION(S) / ED DIAGNOSES  Final diagnoses:  Acute urinary tract infection   Patient discharged AMA.   Sharyn Creamer, MD 03/13/15 [phone]

## 2015-03-13 NOTE — ED Notes (Signed)
Iv discontinued, pt left ama refuses further treatment or admission.

## 2015-03-13 NOTE — Discharge Instructions (Signed)
You have been seen in the Emergency Department (ED) today for a fever and nausea.  Your workup today suggests that you have a urinary tract infection (UTI). Your flu test was negative. As we discussed her blood work is concerning for possible infection that is overwhelming, dangerous, or potentially in the bloodstream. You've been offered admission to the hospital, but refused. I am glad that you are feeling better, though my strong recommendation is admission to rule out life-threatening infection such as "sepsis" or a "bloodstream infection".    Please take your antibiotic as prescribed and over-the-counter pain medication (Tylenol or Motrin) as needed, but no more than recommended on the label instructions.  Drink PLENTY of fluids.  Call your regular doctor right away this morning to schedule the next available appointment to follow up on todays ED visit, or return immediately to the ED if your pain worsens, you have decreased urine production, develop fever, persistent vomiting, decide to be reevaluated for admission (as offered today) or other symptoms that concern you.   Urinary Tract Infection Urinary tract infections (UTIs) can develop anywhere along your urinary tract. Your urinary tract is your body's drainage system for removing wastes and extra water. Your urinary tract includes two kidneys, two ureters, a bladder, and a urethra. Your kidneys are a pair of bean-shaped organs. Each kidney is about the size of your fist. They are located below your ribs, one on each side of your spine. CAUSES Infections are caused by microbes, which are microscopic organisms, including fungi, viruses, and bacteria. These organisms are so small that they can only be seen through a microscope. Bacteria are the microbes that most commonly cause UTIs. SYMPTOMS  Symptoms of UTIs may vary by age and gender of the patient and by the location of the infection. Symptoms in young women typically include a frequent and  intense urge to urinate and a painful, burning feeling in the bladder or urethra during urination. Older women and men are more likely to be tired, shaky, and weak and have muscle aches and abdominal pain. A fever may mean the infection is in your kidneys. Other symptoms of a kidney infection include pain in your back or sides below the ribs, nausea, and vomiting. DIAGNOSIS To diagnose a UTI, your caregiver will ask you about your symptoms. Your caregiver will also ask you to provide a urine sample. The urine sample will be tested for bacteria and white blood cells. White blood cells are made by your body to help fight infection. TREATMENT  Typically, UTIs can be treated with medication. Because most UTIs are caused by a bacterial infection, they usually can be treated with the use of antibiotics. The choice of antibiotic and length of treatment depend on your symptoms and the type of bacteria causing your infection. HOME CARE INSTRUCTIONS  If you were prescribed antibiotics, take them exactly as your caregiver instructs you. Finish the medication even if you feel better after you have only taken some of the medication.  Drink enough water and fluids to keep your urine clear or pale yellow.  Avoid caffeine, tea, and carbonated beverages. They tend to irritate your bladder.  Empty your bladder often. Avoid holding urine for long periods of time.  Empty your bladder before and after sexual intercourse.  After a bowel movement, women should cleanse from front to back. Use each tissue only once. SEEK MEDICAL CARE IF:   You have back pain.  You develop a fever.  Your symptoms do not begin  to resolve within 3 days. SEEK IMMEDIATE MEDICAL CARE IF:   You have severe back pain or lower abdominal pain.  You develop chills.  You have nausea or vomiting.  You have continued burning or discomfort with urination. MAKE SURE YOU:   Understand these instructions.  Will watch your  condition.  Will get help right away if you are not doing well or get worse.   This information is not intended to replace advice given to you by your health care provider. Make sure you discuss any questions you have with your health care provider.   Document Released: 10/21/2004 Document Revised: 10/02/2014 Document Reviewed: 02/19/2011 Elsevier Interactive Patient Education [employer]

## 2015-03-14 DIAGNOSIS — N390 Urinary tract infection, site not specified: Secondary | ICD-10-CM | POA: Diagnosis not present

## 2015-03-14 DIAGNOSIS — E8881 Metabolic syndrome: Secondary | ICD-10-CM | POA: Diagnosis not present

## 2015-03-14 DIAGNOSIS — R5381 Other malaise: Secondary | ICD-10-CM | POA: Diagnosis not present

## 2015-03-15 LAB — URINE CULTURE: Special Requests: NORMAL

## 2015-03-15 LAB — CULTURE, GROUP A STREP (THRC)

## 2015-03-18 DIAGNOSIS — E119 Type 2 diabetes mellitus without complications: Secondary | ICD-10-CM | POA: Diagnosis not present

## 2015-03-18 DIAGNOSIS — G479 Sleep disorder, unspecified: Secondary | ICD-10-CM | POA: Diagnosis not present

## 2015-03-18 DIAGNOSIS — N760 Acute vaginitis: Secondary | ICD-10-CM | POA: Diagnosis not present

## 2015-03-18 DIAGNOSIS — E8881 Metabolic syndrome: Secondary | ICD-10-CM | POA: Diagnosis not present

## 2015-03-18 LAB — CULTURE, BLOOD (ROUTINE X 2)
Culture: NO GROWTH
Culture: NO GROWTH

## 2015-04-26 DIAGNOSIS — M5440 Lumbago with sciatica, unspecified side: Secondary | ICD-10-CM | POA: Diagnosis not present

## 2015-04-26 DIAGNOSIS — N390 Urinary tract infection, site not specified: Secondary | ICD-10-CM | POA: Diagnosis not present

## 2015-04-26 DIAGNOSIS — N760 Acute vaginitis: Secondary | ICD-10-CM | POA: Diagnosis not present

## 2015-07-15 DIAGNOSIS — N760 Acute vaginitis: Secondary | ICD-10-CM | POA: Diagnosis not present

## 2015-07-15 DIAGNOSIS — G479 Sleep disorder, unspecified: Secondary | ICD-10-CM | POA: Diagnosis not present

## 2015-07-15 DIAGNOSIS — E119 Type 2 diabetes mellitus without complications: Secondary | ICD-10-CM | POA: Diagnosis not present

## 2015-07-15 DIAGNOSIS — N390 Urinary tract infection, site not specified: Secondary | ICD-10-CM | POA: Diagnosis not present

## 2015-08-12 DIAGNOSIS — N760 Acute vaginitis: Secondary | ICD-10-CM | POA: Diagnosis not present

## 2015-08-12 DIAGNOSIS — B370 Candidal stomatitis: Secondary | ICD-10-CM | POA: Diagnosis not present

## 2015-08-12 DIAGNOSIS — E119 Type 2 diabetes mellitus without complications: Secondary | ICD-10-CM | POA: Diagnosis not present

## 2015-08-12 DIAGNOSIS — A64 Unspecified sexually transmitted disease: Secondary | ICD-10-CM | POA: Diagnosis not present

## 2015-08-13 DIAGNOSIS — R300 Dysuria: Secondary | ICD-10-CM | POA: Diagnosis not present

## 2015-08-13 DIAGNOSIS — E1165 Type 2 diabetes mellitus with hyperglycemia: Secondary | ICD-10-CM | POA: Diagnosis not present

## 2015-08-13 DIAGNOSIS — N898 Other specified noninflammatory disorders of vagina: Secondary | ICD-10-CM | POA: Diagnosis not present

## 2015-08-13 DIAGNOSIS — R81 Glycosuria: Secondary | ICD-10-CM | POA: Diagnosis not present

## 2015-08-19 DIAGNOSIS — E119 Type 2 diabetes mellitus without complications: Secondary | ICD-10-CM | POA: Diagnosis not present

## 2015-08-19 DIAGNOSIS — N760 Acute vaginitis: Secondary | ICD-10-CM | POA: Diagnosis not present

## 2016-02-20 DIAGNOSIS — E8881 Metabolic syndrome: Secondary | ICD-10-CM | POA: Diagnosis not present

## 2016-02-20 DIAGNOSIS — E119 Type 2 diabetes mellitus without complications: Secondary | ICD-10-CM | POA: Diagnosis not present

## 2016-02-20 DIAGNOSIS — G479 Sleep disorder, unspecified: Secondary | ICD-10-CM | POA: Diagnosis not present

## 2016-02-20 DIAGNOSIS — N760 Acute vaginitis: Secondary | ICD-10-CM | POA: Diagnosis not present

## 2016-06-11 ENCOUNTER — Ambulatory Visit: Payer: Self-pay | Admitting: Certified Nurse Midwife

## 2016-07-25 HISTORY — PX: OTHER SURGICAL HISTORY: SHX169

## 2016-08-18 ENCOUNTER — Ambulatory Visit (INDEPENDENT_AMBULATORY_CARE_PROVIDER_SITE_OTHER): Payer: 59 | Admitting: Advanced Practice Midwife

## 2016-08-18 ENCOUNTER — Encounter: Payer: Self-pay | Admitting: Advanced Practice Midwife

## 2016-08-18 VITALS — BP 110/80 | HR 98 | Ht 65.0 in | Wt 218.0 lb

## 2016-08-18 DIAGNOSIS — Z01419 Encounter for gynecological examination (general) (routine) without abnormal findings: Secondary | ICD-10-CM

## 2016-08-18 DIAGNOSIS — Z124 Encounter for screening for malignant neoplasm of cervix: Secondary | ICD-10-CM

## 2016-08-18 NOTE — Addendum Note (Signed)
Addended by: Tresea MallGLEDHILL, Cortez Flippen on: 08/18/2016 01:48 PM   Modules accepted: Orders

## 2016-08-18 NOTE — Progress Notes (Addendum)
Patient ID: Cheryl Carpenter, female   DOB: 06-Feb-1990, 26 y.o.   MRN: [id number]     Gynecology Annual Exam  PCP: Corky DownsMasoud, Javed, MD  Chief Complaint:  Chief Complaint  Patient presents with  . Gynecologic Exam  . Nexplanon removal    History of Present Illness: Patient is a 26 y.o. G1P1001 presents for annual exam. The patient has no complaints today. She requests removal of Nexplanon today as she and her husband are planning a pregnancy.  LMP: No LMP recorded. Patient has had an implant. Postcoital Bleeding: no Dysmenorrhea: not applicable  The patient is sexually active. She currently uses Nexplanon for contraception. She denies dyspareunia.  The patient does perform self breast exams.  There is no notable family history of breast or ovarian cancer in her family.  The patient wears seatbelts: yes.   The patient has regular exercise: she admits regular activity but no cardio exercise.    The patient denies current symptoms of depression.    Review of Systems: Review of Systems  Constitutional: Negative.   HENT: Negative.   Eyes: Negative.   Respiratory: Negative.   Cardiovascular: Negative.   Gastrointestinal: Negative.   Genitourinary: Negative.   Musculoskeletal: Negative.   Skin: Negative.   Neurological: Negative.   Endo/Heme/Allergies: Negative.   Psychiatric/Behavioral: Negative.     Past Medical History:  History reviewed. Type 2 Diabetes Mellitus  Past Surgical History:  Past Surgical History:  Procedure Laterality Date  . CESAREAN SECTION    . FOOT SURGERY    . nexplanon removal  07/2016    Gynecologic History:  No LMP recorded. Patient has had an implant. Contraception: Nexplanon Last Pap: Results were: no abnormalities   Obstetric History: G1P1001  Family History:  Family History  Problem Relation Age of Onset  . Diabetes Father   . Heart failure Other     Social History:  Social History   Social History  . Marital status: Single    Spouse name: N/A  . Number of children: N/A  . Years of education: N/A   Occupational History  . Not on file.   Social History Main Topics  . Smoking status: Never Smoker  . Smokeless tobacco: Never Used  . Alcohol use No  . Drug use: No  . Sexual activity: Yes   Other Topics Concern  . Not on file   Social History Narrative  . No narrative on file    Allergies:  Allergies  Allergen Reactions  . Chocolate   . Eggs Or Egg-Derived Products   . Peanut-Containing Drug Products   . Shellfish Allergy     Medications: Prior to Admission medications   Medication Sig Start Date End Date Taking? Authorizing Provider  metFORMIN (GLUCOPHAGE) 500 MG tablet Take by mouth 2 (two) times daily with a meal.   Yes [provider]    Physical Exam Vitals: Blood pressure 110/80, pulse 98, height 5\' 5"  (1.651 m), weight 218 lb (98.9 kg).  General: NAD HEENT: normocephalic, anicteric Thyroid: no enlargement, no palpable nodules Pulmonary: No increased work of breathing, CTAB Cardiovascular: RRR, distal pulses 2+ Breast: Breast symmetrical, no tenderness, no palpable nodules or masses, no skin or nipple retraction present, no nipple discharge.  No axillary or supraclavicular lymphadenopathy. Abdomen: NABS, soft, non-tender, non-distended.  Umbilicus without lesions.  No hepatomegaly, splenomegaly or masses palpable. No evidence of hernia  Genitourinary:  External: Normal external female genitalia.  Normal urethral meatus, normal  Bartholin's and Skene's glands.  Vagina: Normal vaginal mucosa, no evidence of prolapse.    Cervix: Grossly normal in appearance, no bleeding, no CMT  Uterus: Non-enlarged, mobile, normal contour.    Adnexa: ovaries non-enlarged, no adnexal masses  Rectal: deferred  Lymphatic: no evidence of inguinal lymphadenopathy Extremities: no edema, erythema, or tenderness Neurologic: Grossly intact Psychiatric: mood appropriate, affect  full    Assessment: 26 y.o. G1P1001 Well woman  Plan: Problem List Items Addressed This Visit    None    Visit Diagnoses    Well woman exam with routine gynecological exam    -  Primary      1) STI screening was offered and declined  2) ASCCP guidelines and rational discussed.  Patient opts for every 2-3 years screening interval  3) Contraception - none following removal of Nexplanon- planning pregnancy. Increase preconception healthy lifestyle diet and exercise to try to get off anti diabetic medication  4) Routine healthcare maintenance including cholesterol, diabetes screening discussed Declines  5) Follow up 1 year for routine annual exam   Cheryl Carpenter, CNM        GYNECOLOGY PROCEDURE NOTE  Nexplanon removal discussed in detail.  Risks of infection, bleeding, nerve injury all reviewed.  Patient understands risks and desires to proceed.  Verbal consent obtained.  Patient is certain she wants the Nexplanon removed.  All questions answered.  Procedure: Patient placed in dorsal supine with left arm above head, elbow flexed at 90 degrees, arm resting on examination table.  Nexplanon identified without problems.  Betadine scrub x3.  1 ml of 1% lidocaine injected under Nexplanon device without problems.  Sterile gloves applied.  Small 0.5cm incision made at distal tip of Nexplanon device with 11 blade scalpel.  Nexplanon brought to incision and grasped with a small kelly clamp.  Nexplanon removed intact without problems.  Pressure applied to incision.  Hemostasis obtained.  Steri-strips applied, followed by bandage and compression dressing.  Patient tolerated procedure well.  No complications.   Assessment: 26 y.o. year old female now s/p uncomplicated Nexplanon removal.  Plan: 1.  Patient given post procedure precautions and asked to call for fever, chills, redness or drainage from her incision, bleeding from incision.  She understands she will likely have a small bruise  near site of removal and can remove bandage tomorrow and steri-strips in approximately 1 week.  2) Contraception: planning a pregnancy   Cheryl MallJane Demarkus Carpenter, CNM

## 2016-08-21 LAB — IGP, RFX APTIMA HPV ASCU: PAP Smear Comment: 0

## 2016-11-10 ENCOUNTER — Ambulatory Visit (INDEPENDENT_AMBULATORY_CARE_PROVIDER_SITE_OTHER): Payer: 59 | Admitting: Maternal Newborn

## 2016-11-10 ENCOUNTER — Encounter: Payer: Self-pay | Admitting: Maternal Newborn

## 2016-11-10 VITALS — BP 134/90 | Wt 215.0 lb

## 2016-11-10 DIAGNOSIS — O0991 Supervision of high risk pregnancy, unspecified, first trimester: Secondary | ICD-10-CM | POA: Insufficient documentation

## 2016-11-10 DIAGNOSIS — E119 Type 2 diabetes mellitus without complications: Secondary | ICD-10-CM | POA: Insufficient documentation

## 2016-11-10 DIAGNOSIS — E669 Obesity, unspecified: Secondary | ICD-10-CM

## 2016-11-10 DIAGNOSIS — N912 Amenorrhea, unspecified: Secondary | ICD-10-CM

## 2016-11-10 LAB — POCT URINE PREGNANCY: Preg Test, Ur: POSITIVE — AB

## 2016-11-10 NOTE — Progress Notes (Signed)
11/10/2016   Chief Complaint: Amenorrhea, positive home pregnancy test, desires prenatal care.  Transfer of Care Patient: no  History of Present Illness: Cheryl Carpenter is a 26 y.o. G2P1001 at [redacted]w[redacted]d based on Patient's last menstrual period on 08/30/2016 (approximate), with an Estimated Date of Delivery: 06/06/2017, with the above CC.   Her periods were: Absent with Nexplanon, one cycle since Nexplanon removed in July 2018. LMP: 08/30/2016. She was using no method when she conceived.  She has Positive signs or symptoms of nausea/vomiting of pregnancy, breast tenderness, fatigue. She has Negative signs or symptoms of miscarriage or preterm labor. She identifies Negative Zika risk factors for her and her partner On any different medications around the time she conceived/early pregnancy: No  History of varicella: Yes   ROS: A 12-point review of systems was performed and negative, except as stated in the above HPI.  OBGYN History: As per HPI. OB History  Gravida Para Term Preterm AB Living  2 1 1     1   SAB TAB Ectopic Multiple Live Births          1    # Outcome Date GA Lbr Len/2nd Weight Sex Delivery Anes PTL Lv  2 Current           1 Term 10/27/08 [redacted]w[redacted]d  8 lb (3.629 kg) F CS-LTranv  N LIV     Birth Comments: FETAL HEART RATE WENT UP AND STARTED RUNNING FEVER      Any issues with any prior pregnancies: no Any prior children are healthy, doing well, without any problems or issues: yes History of pap smears: Yes. Last pap smear 08/18/2016. Abnormal: no, NILM History of STIs: Yes   Past Medical History: No past medical history on file.  Past Surgical History: Past Surgical History:  Procedure Laterality Date  . CESAREAN SECTION    . FOOT SURGERY    . nexplanon removal  07/2016    Family History:  Family History  Problem Relation Age of Onset  . Diabetes Father   . Heart failure Other    She denies any female cancers, bleeding or blood clotting disorders.  She denies any  history of intellectual disability, birth defects or genetic disorders in her or the FOB's history  Social History:  Social History   Social History  . Marital status: Single    Spouse name: N/A  . Number of children: N/A  . Years of education: N/A   Occupational History  . Not on file.   Social History Main Topics  . Smoking status: Never Smoker  . Smokeless tobacco: Never Used  . Alcohol use No  . Drug use: No  . Sexual activity: Yes   Other Topics Concern  . Not on file   Social History Narrative  . No narrative on file   Any cats in the household: no  Allergy: Allergies  Allergen Reactions  . Chocolate   . Eggs Or Egg-Derived Products Other (See Comments)  . Peanut-Containing Drug Products   . Shellfish Allergy     Current Outpatient Medications:  Current Outpatient Prescriptions:  .  metFORMIN (GLUCOPHAGE) 500 MG tablet, Take by mouth 2 (two) times daily with a meal., Disp: , Rfl:    Physical Exam:   BP 134/90   Wt 215 lb (97.5 kg)   LMP 08/30/2016 (Approximate)   BMI 35.78 kg/m  Body mass index is 35.78 kg/m. Constitutional: Well nourished, well developed female in no acute distress.  Neck:  Supple, normal appearance,  and no thyromegaly  Cardiovascular: S1, S2 normal, no murmur, rub or gallop, regular rate and rhythm Respiratory:  Clear to auscultation bilateral. Normal respiratory effort Abdomen: positive bowel sounds and no masses, hernias; diffusely non tender to palpation, non distended Breasts: breasts appear normal, no suspicious masses, no skin or nipple changes or axillary nodes. Neuro/Psych:  Normal mood and affect.  Skin:  Warm and dry.  Lymphatic:  No inguinal lymphadenopathy.   Pelvic exam: is not limited by body habitus External genitalia, Bartholin's glands, Urethra, Skene's glands: within normal limits Vagina: within normal limits and with no blood in the vault  Cervix: normal appearing cervix without discharge or lesions,  closed/long/high Uterus:  enlarged: consistent with pregnancy, normal contour Adnexa:  no mass, fullness, tenderness  Assessment: Cheryl Carpenter is a 26 y.o. G2P1001 presenting for prenatal care.  Plan:  1) Avoid alcoholic beverages. 2) Patient encouraged not to smoke.  3) Discontinue the use of all non-medicinal drugs and chemicals.  4) Take prenatal vitamins daily. Samples given. 5) Seatbelt use advised. 6) Nutrition, food safety (fish, cheese advisories, and high nitrite foods) and exercise discussed. 7) Hospital and practice style [facility] discussed.  8) Patient is asked about travel to areas at risk for the Zika virus, and counseled to avoid travel and exposure to mosquitoes or sexual partners who may have themselves been exposed to the virus.   9) [facility] advised. 10) Genetic Screening, such as with 1st Trimester Screening, cell free fetal DNA, AFP testing, and Ultrasound, as well as with amniocentesis and CVS as appropriate, is discussed with patient. She plans to have genetic testing this pregnancy.  Patient did not have diabetes in current problem list, but stated that she was "borderline" and therefore prescribed metformin. Records review showed a Hgb A1C of 10.2 on 08/13/2015. Added DM II to problem list.  Lab orders in system, to be drawn next visit as patient did not have time for early 1 hour glucose today. Also needs UDS as insufficient amount collected this time.  GC and urine culture done today.  Problem list reviewed and updated.  Return in about 2 days (around 11/12/2016) for ROB following ultrasound.  Marcelyn BruinsJacelyn Remell Giaimo, CNM [facility], [facility] 11/10/2016  7:50 PM

## 2016-11-10 NOTE — Patient Instructions (Signed)
First Trimester of Pregnancy The first trimester of pregnancy is from week 1 until the end of week 13 (months 1 through 3). A week after a sperm fertilizes an egg, the egg will implant on the wall of the uterus. This embryo will begin to develop into a baby. Genes from you and your partner will form the baby. The female genes will determine whether the baby will be a boy or a girl. At 6-8 weeks, the eyes and face will be formed, and the heartbeat can be seen on ultrasound. At the end of 12 weeks, all the baby's organs will be formed. Now that you are pregnant, you will want to do everything you can to have a healthy baby. Two of the most important things are to get good prenatal care and to follow your health care provider's instructions. Prenatal care is all the medical care you receive before the baby's birth. This care will help prevent, find, and treat any problems during the pregnancy and childbirth. Body changes during your first trimester Your body goes through many changes during pregnancy. The changes vary from woman to woman.  You may gain or lose a couple of pounds at first.  You may feel sick to your stomach (nauseous) and you may throw up (vomit). If the vomiting is uncontrollable, call your health care provider.  You may tire easily.  You may develop headaches that can be relieved by medicines. All medicines should be approved by your health care provider.  You may urinate more often. Painful urination may mean you have a bladder infection.  You may develop heartburn as a result of your pregnancy.  You may develop constipation because certain hormones are causing the muscles that push stool through your intestines to slow down.  You may develop hemorrhoids or swollen veins (varicose veins).  Your breasts may begin to grow larger and become tender. Your nipples may stick out more, and the tissue that surrounds them (areola) may become darker.  Your gums may bleed and may be  sensitive to brushing and flossing.  Dark spots or blotches (chloasma, mask of pregnancy) may develop on your face. This will likely fade after the baby is born.  Your menstrual periods will stop.  You may have a loss of appetite.  You may develop cravings for certain kinds of food.  You may have changes in your emotions from day to day, such as being excited to be pregnant or being concerned that something may go wrong with the pregnancy and baby.  You may have more vivid and strange dreams.  You may have changes in your hair. These can include thickening of your hair, rapid growth, and changes in texture. Some women also have hair loss during or after pregnancy, or hair that feels dry or thin. Your hair will most likely return to normal after your baby is born.  What to expect at prenatal visits During a routine prenatal visit:  You will be weighed to make sure you and the baby are growing normally.  Your blood pressure will be taken.  Your abdomen will be measured to track your baby's growth.  The fetal heartbeat will be listened to between weeks 10 and 14 of your pregnancy.  Test results from any previous visits will be discussed.  Your health care provider may ask you:  How you are feeling.  If you are feeling the baby move.  If you have had any abnormal symptoms, such as leaking fluid, bleeding, severe headaches,   or abdominal cramping.  If you are using any tobacco products, including cigarettes, chewing tobacco, and electronic cigarettes.  If you have any questions.  Other tests that may be performed during your first trimester include:  Blood tests to find your blood type and to check for the presence of any previous infections. The tests will also be used to check for low iron levels (anemia) and protein on red blood cells (Rh antibodies). Depending on your risk factors, or if you previously had diabetes during pregnancy, you may have tests to check for high blood  sugar that affects pregnant women (gestational diabetes).  Urine tests to check for infections, diabetes, or protein in the urine.  An ultrasound to confirm the proper growth and development of the baby.  Fetal screens for spinal cord problems (spina bifida) and Down syndrome.  HIV (human immunodeficiency virus) testing. Routine prenatal testing includes screening for HIV, unless you choose not to have this test.  You may need other tests to make sure you and the baby are doing well.  Follow these instructions at home: Medicines  Follow your health care provider's instructions regarding medicine use. Specific medicines may be either safe or unsafe to take during pregnancy.  Take a prenatal vitamin that contains at least 600 micrograms (mcg) of folic acid.  If you develop constipation, try taking a stool softener if your health care provider approves. Eating and drinking  Eat a balanced diet that includes fresh fruits and vegetables, whole grains, good sources of protein such as meat, eggs, or tofu, and low-fat dairy. Your health care provider will help you determine the amount of weight gain that is right for you.  Avoid raw meat and uncooked cheese. These carry germs that can cause birth defects in the baby.  Eating four or five small meals rather than three large meals a day may help relieve nausea and vomiting. If you start to feel nauseous, eating a few soda crackers can be helpful. Drinking liquids between meals, instead of during meals, also seems to help ease nausea and vomiting.  Limit foods that are high in fat and processed sugars, such as fried and sweet foods.  To prevent constipation: ? Eat foods that are high in fiber, such as fresh fruits and vegetables, whole grains, and beans. ? Drink enough fluid to keep your urine clear or pale yellow. Activity  Exercise only as directed by your health care provider. Most women can continue their usual exercise routine during  pregnancy. Try to exercise for 30 minutes at least 5 days a week. Exercising will help you: ? Control your weight. ? Stay in shape. ? Be prepared for labor and delivery.  Experiencing pain or cramping in the lower abdomen or lower back is a good sign that you should stop exercising. Check with your health care provider before continuing with normal exercises.  Try to avoid standing for long periods of time. Move your legs often if you must stand in one place for a long time.  Avoid heavy lifting.  Wear low-heeled shoes and practice good posture.  You may continue to have sex unless your health care provider tells you not to. Relieving pain and discomfort  Wear a good support bra to relieve breast tenderness.  Take warm sitz baths to soothe any pain or discomfort caused by hemorrhoids. Use hemorrhoid cream if your health care provider approves.  Rest with your legs elevated if you have leg cramps or low back pain.  If you develop   varicose veins in your legs, wear support hose. Elevate your feet for 15 minutes, 3-4 times a day. Limit salt in your diet. Prenatal care  Schedule your prenatal visits by the twelfth week of pregnancy. They are usually scheduled monthly at first, then more often in the last 2 months before delivery.  Write down your questions. Take them to your prenatal visits.  Keep all your prenatal visits as told by your health care provider. This is important. Safety  Wear your seat belt at all times when driving.  Make a list of emergency phone numbers, including numbers for family, friends, the hospital, and police and fire departments. General instructions  Ask your health care provider for a referral to a local prenatal education class. Begin classes no later than the beginning of month 6 of your pregnancy.  Ask for help if you have counseling or nutritional needs during pregnancy. Your health care provider can offer advice or refer you to specialists for help  with various needs.  Do not use hot tubs, steam rooms, or saunas.  Do not douche or use tampons or scented sanitary pads.  Do not cross your legs for long periods of time.  Avoid cat litter boxes and soil used by cats. These carry germs that can cause birth defects in the baby and possibly loss of the fetus by miscarriage or stillbirth.  Avoid all smoking, herbs, alcohol, and medicines not prescribed by your health care provider. Chemicals in these products affect the formation and growth of the baby.  Do not use any products that contain nicotine or tobacco, such as cigarettes and e-cigarettes. If you need help quitting, ask your health care provider. You may receive counseling support and other resources to help you quit.  Schedule a dentist appointment. At home, brush your teeth with a soft toothbrush and be gentle when you floss. Contact a health care provider if:  You have dizziness.  You have mild pelvic cramps, pelvic pressure, or nagging pain in the abdominal area.  You have persistent nausea, vomiting, or diarrhea.  You have a bad smelling vaginal discharge.  You have pain when you urinate.  You notice increased swelling in your face, hands, legs, or ankles.  You are exposed to fifth disease or chickenpox.  You are exposed to German measles (rubella) and have never had it. Get help right away if:  You have a fever.  You are leaking fluid from your vagina.  You have spotting or bleeding from your vagina.  You have severe abdominal cramping or pain.  You have rapid weight gain or loss.  You vomit blood or material that looks like coffee grounds.  You develop a severe headache.  You have shortness of breath.  You have any kind of trauma, such as from a fall or a car accident. Summary  The first trimester of pregnancy is from week 1 until the end of week 13 (months 1 through 3).  Your body goes through many changes during pregnancy. The changes vary from  woman to woman.  You will have routine prenatal visits. During those visits, your health care provider will examine you, discuss any test results you may have, and talk with you about how you are feeling. This information is not intended to replace advice given to you by your health care provider. Make sure you discuss any questions you have with your health care provider. Document Released: 01/05/2001 Document Revised: 12/24/2015 Document Reviewed: 12/24/2015 Elsevier Interactive Patient Education  2017 Elsevier   Inc.  

## 2016-11-12 ENCOUNTER — Other Ambulatory Visit: Payer: Self-pay | Admitting: Maternal Newborn

## 2016-11-12 ENCOUNTER — Ambulatory Visit (INDEPENDENT_AMBULATORY_CARE_PROVIDER_SITE_OTHER): Payer: 59

## 2016-11-12 ENCOUNTER — Ambulatory Visit (INDEPENDENT_AMBULATORY_CARE_PROVIDER_SITE_OTHER): Payer: 59 | Admitting: Advanced Practice Midwife

## 2016-11-12 VITALS — BP 130/90 | Wt 217.0 lb

## 2016-11-12 DIAGNOSIS — O0991 Supervision of high risk pregnancy, unspecified, first trimester: Secondary | ICD-10-CM

## 2016-11-12 DIAGNOSIS — E119 Type 2 diabetes mellitus without complications: Secondary | ICD-10-CM

## 2016-11-12 DIAGNOSIS — Z362 Encounter for other antenatal screening follow-up: Secondary | ICD-10-CM | POA: Diagnosis not present

## 2016-11-12 DIAGNOSIS — Z3A01 Less than 8 weeks gestation of pregnancy: Secondary | ICD-10-CM

## 2016-11-12 NOTE — Progress Notes (Signed)
Routine Prenatal Care Visit  Subjective  Cheryl HoyerBrittany L Delong is a [age] y.o. G2P1001 at 6284w6d being seen today for ongoing prenatal care.  She is currently monitored for the following issues for this high-risk pregnancy and has ACUT SUPPRATV OTITIS MEDIA W/O SPONT RUP EARDRUM; ALLERGIC RHINITIS; PEANUT ALLERGY; EGG ALLERGY; SHELLFISH ALLERGY; Diabetes mellitus, type 2 (HCC); Supervision of high risk pregnancy, antepartum, first trimester; and Obesity (BMI 30.0-34.9) on her problem list.  ----------------------------------------------------------------------------------- Patient reports no complaints.  She is taking Metformin for DM II and checking her blood sugar fasting and after meals. She states that her BS is usually wnl.  Denies leaking of fluid or vaginal bleeding or cramping.  ----------------------------------------------------------------------------------- The following portions of the patient's history were reviewed and updated as appropriate: allergies, current medications, past family history, past medical history, past social history, past surgical history and problem list. Problem list updated.   Objective  Blood pressure 130/90, weight 217 lb (98.4 kg), last menstrual period 08/30/2016. Pregravid weight 210 lb (95.3 kg) Total Weight Gain 7 lb (3.175 kg) Urinalysis: Urine Protein: Negative Urine Glucose: Negative  Fetal Status: dating u/s today: 9484w6d by u/s. EDD adjusted  General:  Alert, oriented and cooperative. Patient is in no acute distress.  Skin: Skin is warm and dry. No rash noted.   Cardiovascular: Normal heart rate noted  Respiratory: Normal respiratory effort, no problems with respiration noted  Abdomen: Soft, gravid, appropriate for gestational age.       Pelvic:  Cervical exam deferred        Extremities: Normal range of motion.     Mental Status: Normal mood and affect. Normal behavior. Normal judgment and thought content.   Assessment   26 y.o. G2P1001 at 6284w6d by   07/02/2017, by Ultrasound presenting for routine prenatal visit  Plan   SECOND Problems (from 11/10/16 to present)    Problem Noted Resolved   Diabetes mellitus, type 2 (HCC) 11/10/2016 by Oswaldo ConroySchmid, Jacelyn Y, CNM No   Supervision of high risk pregnancy, antepartum, first trimester 11/10/2016 by Oswaldo ConroySchmid, Jacelyn Y, CNM No   Overview Signed 11/10/2016  7:47 PM by Oswaldo ConroySchmid, Jacelyn Y, CNM    Clinic [facility] Prenatal Labs  Dating  Blood type:     Genetic Screen 1 Screen:    AFP:     Quad:     NIPS: Antibody:   Anatomic US  Rubella:   Varicella:    GTT Early:               Third trimester:  RPR:     Rhogam  HBsAg:     TDaP vaccine                       Flu Shot: HIV:     Baby Food                                GBS:   Contraception  Pap:  CBB     CS/VBAC  DM II on Metformin  Support Person                  Preterm labor symptoms and general obstetric precautions including but not limited to vaginal bleeding, contractions, leaking of fluid and fetal movement were reviewed in detail with the patient. Please refer to After Visit Summary for other counseling recommendations.   Return for NOB labs and Hgb A1C/ rob.  Tresea MallJane Willam Munford,  CNM  11/12/2016 4:35 PM

## 2016-11-13 LAB — URINE CULTURE: Organism ID, Bacteria: NO GROWTH

## 2016-11-13 LAB — GC/CHLAMYDIA PROBE AMP
Chlamydia trachomatis, NAA: NEGATIVE
Neisseria gonorrhoeae by PCR: NEGATIVE

## 2016-11-19 NOTE — Addendum Note (Signed)
Addended by: Tresea MallGLEDHILL, Haileigh Pitz on: 11/19/2016 04:31 PM   Modules accepted: Orders

## 2016-12-13 ENCOUNTER — Ambulatory Visit (INDEPENDENT_AMBULATORY_CARE_PROVIDER_SITE_OTHER): Payer: 59 | Admitting: Certified Nurse Midwife

## 2016-12-13 ENCOUNTER — Other Ambulatory Visit: Payer: Self-pay | Admitting: Maternal Newborn

## 2016-12-13 ENCOUNTER — Other Ambulatory Visit: Payer: 59

## 2016-12-13 ENCOUNTER — Encounter: Payer: Self-pay | Admitting: Certified Nurse Midwife

## 2016-12-13 VITALS — BP 120/80 | Wt 224.0 lb

## 2016-12-13 DIAGNOSIS — O24311 Unspecified pre-existing diabetes mellitus in pregnancy, first trimester: Secondary | ICD-10-CM

## 2016-12-13 DIAGNOSIS — O34219 Maternal care for unspecified type scar from previous cesarean delivery: Secondary | ICD-10-CM | POA: Insufficient documentation

## 2016-12-13 DIAGNOSIS — Z1379 Encounter for other screening for genetic and chromosomal anomalies: Secondary | ICD-10-CM

## 2016-12-13 DIAGNOSIS — O0991 Supervision of high risk pregnancy, unspecified, first trimester: Secondary | ICD-10-CM

## 2016-12-13 DIAGNOSIS — E119 Type 2 diabetes mellitus without complications: Secondary | ICD-10-CM

## 2016-12-13 DIAGNOSIS — Z3A11 11 weeks gestation of pregnancy: Secondary | ICD-10-CM

## 2016-12-13 DIAGNOSIS — O24319 Unspecified pre-existing diabetes mellitus in pregnancy, unspecified trimester: Secondary | ICD-10-CM | POA: Insufficient documentation

## 2016-12-13 HISTORY — DX: Unspecified pre-existing diabetes mellitus in pregnancy, unspecified trimester: O24.319

## 2016-12-13 MED ORDER — FOLIC ACID 1 MG PO TABS: 1.0000 mg | ORAL_TABLET | Freq: Every day | ORAL | 10 refills | Status: DC

## 2016-12-13 NOTE — Progress Notes (Signed)
Pt reports no problems.   

## 2016-12-13 NOTE — Progress Notes (Signed)
HROB for Type II diabetes and obesity (BMI originally 6434) States she takes her Metformin only once a day. FSBSs in the 90s per patient whether fasting or pp Will get NOB labs today along with CMP, hemoglobin A1C Add folic acid 1 mgm daily to vitamins Start ASA 81 mgm daily  at 12 week FHTS 166 today with DT Desires first trimester test: RTO in 1-2 weeks for NT/ lab Offer fetal echo at 22-24 weeks

## 2016-12-15 LAB — RPR+RH+ABO+RUB AB+AB SCR+CB...
Antibody Screen: NEGATIVE
HIV Screen 4th Generation wRfx: NONREACTIVE
Hematocrit: 40.9 % (ref 34.0–46.6)
Hemoglobin: 13.8 g/dL (ref 11.1–15.9)
Hepatitis B Surface Ag: NEGATIVE
MCH: 31 pg (ref 26.6–33.0)
MCHC: 33.7 g/dL (ref 31.5–35.7)
MCV: 92 fL (ref 79–97)
Platelets: 238 10*3/uL (ref 150–379)
RBC: 4.45 x10E6/uL (ref 3.77–5.28)
RDW: 14 % (ref 12.3–15.4)
RPR Ser Ql: NONREACTIVE
Rh Factor: POSITIVE
Rubella Antibodies, IGG: 6.69 index (ref >0.99)
Varicella zoster IgG: 987 index (ref >165)
WBC: 14 10*3/uL — ABNORMAL HIGH (ref 3.4–10.8)

## 2016-12-15 LAB — HEMOGLOBINOPATHY EVALUATION
HGB C: 0 %
HGB S: 0 %
HGB VARIANT: 0 %
Hemoglobin A2 Quantitation: 2.5 % (ref 1.8–3.2)
Hemoglobin F Quantitation: 0 % (ref 0.0–2.0)
Hgb A: 97.5 % (ref 96.4–98.8)

## 2016-12-15 LAB — COMPREHENSIVE METABOLIC PANEL
ALT: 9 IU/L (ref 0–32)
AST: 12 IU/L (ref 0–40)
Albumin/Globulin Ratio: 1.2 (ref 1.2–2.2)
Albumin: 3.9 g/dL (ref 3.5–5.5)
Alkaline Phosphatase: 51 IU/L (ref 39–117)
BUN/Creatinine Ratio: 12 (ref 9–23)
BUN: 7 mg/dL (ref 6–20)
Bilirubin Total: 0.3 mg/dL (ref 0.0–1.2)
CO2: 22 mmol/L (ref 20–29)
Calcium: 9.2 mg/dL (ref 8.7–10.2)
Chloride: 101 mmol/L (ref 96–106)
Creatinine, Ser: 0.58 mg/dL (ref 0.57–1.00)
GFR calc Af Amer: 145 mL/min/{1.73_m2} (ref >59)
GFR calc non Af Amer: 126 mL/min/{1.73_m2} (ref >59)
Globulin, Total: 3.2 g/dL (ref 1.5–4.5)
Glucose: 190 mg/dL — ABNORMAL HIGH (ref 65–99)
Potassium: 3.9 mmol/L (ref 3.5–5.2)
Sodium: 137 mmol/L (ref 134–144)
Total Protein: 7.1 g/dL (ref 6.0–8.5)

## 2016-12-15 LAB — HGB A1C W/O EAG: Hgb A1c MFr Bld: 6 % — ABNORMAL HIGH (ref 4.8–5.6)

## 2016-12-16 LAB — URINE CULTURE

## 2016-12-16 LAB — URINE DRUG PANEL 7
Amphetamines, Urine: NEGATIVE ng/mL
Barbiturate Quant, Ur: NEGATIVE ng/mL
Benzodiazepine Quant, Ur: NEGATIVE ng/mL
Cannabinoid Quant, Ur: NEGATIVE ng/mL
Cocaine (Metab.): NEGATIVE ng/mL
Opiate Quant, Ur: NEGATIVE ng/mL
PCP Quant, Ur: NEGATIVE ng/mL

## 2016-12-27 ENCOUNTER — Ambulatory Visit (INDEPENDENT_AMBULATORY_CARE_PROVIDER_SITE_OTHER): Payer: 59 | Admitting: Obstetrics & Gynecology

## 2016-12-27 ENCOUNTER — Other Ambulatory Visit: Payer: 59

## 2016-12-27 VITALS — BP 120/80 | Wt 221.0 lb

## 2016-12-27 DIAGNOSIS — Z3A13 13 weeks gestation of pregnancy: Secondary | ICD-10-CM

## 2016-12-27 DIAGNOSIS — O209 Hemorrhage in early pregnancy, unspecified: Secondary | ICD-10-CM | POA: Diagnosis not present

## 2016-12-27 DIAGNOSIS — O208 Other hemorrhage in early pregnancy: Secondary | ICD-10-CM

## 2016-12-27 NOTE — Patient Instructions (Signed)
Vaginal Bleeding During Pregnancy, First Trimester °A small amount of bleeding (spotting) from the vagina is common in early pregnancy. Sometimes the bleeding is normal and is not a problem, and sometimes it is a sign of something serious. Be sure to tell your doctor about any bleeding from your vagina right away. °Follow these instructions at home: °· Watch your condition for any changes. °· Follow your doctor's instructions about how active you can be. °· If you are on bed rest: °? You may need to stay in bed and only get up to use the bathroom. °? You may be allowed to do some activities. °? If you need help, make plans for someone to help you. °· Write down: °? The number of pads you use each day. °? How often you change pads. °? How soaked (saturated) your pads are. °· Do not use tampons. °· Do not douche. °· Do not have sex or orgasms until your doctor says it is okay. °· If you pass any tissue from your vagina, save the tissue so you can show it to your doctor. °· Only take medicines as told by your doctor. °· Do not take aspirin because it can make you bleed. °· Keep all follow-up visits as told by your doctor. °Contact a doctor if: °· You bleed from your vagina. °· You have cramps. °· You have labor pains. °· You have a fever that does not go away after you take medicine. °Get help right away if: °· You have very bad cramps in your back or belly (abdomen). °· You pass large clots or tissue from your vagina. °· You bleed more. °· You feel light-headed or weak. °· You pass out (faint). °· You have chills. °· You are leaking fluid or have a gush of fluid from your vagina. °· You pass out while pooping (having a bowel movement). °This information is not intended to replace advice given to you by your health care provider. Make sure you discuss any questions you have with your health care provider. °Document Released: 05/28/2013 Document Revised: 06/19/2015 Document Reviewed: 09/18/2012 °Elsevier Interactive  Patient Education © 2018 Elsevier Inc. ° °

## 2016-12-27 NOTE — Progress Notes (Signed)
Obstetric Problem Visit    Chief Complaint  Patient presents with  . Vaginal Bleeding   History of Present Illness: Patient is a 26 y.o. G2P1001 6924w2d presenting for first trimester bleeding.  The onset of bleeding was yesterday and has been light, brown at times.  No pain.  No nausea.  Pos GERD sx's.  Is bleeding equal to or greater than normal menstrual flow:  No Any recent trauma:  No Recent intercourse:  No History of prior miscarriage:  No Prior ultrasound demonstrating IUP:  Yes Prior ultrasound demonstrating viable IUP:  Yes Prior Serum HCG:  No Rh status: A+  PMHx: She  has a past medical history of Obesity (BMI 30-39.9), Previous cesarean section, and Type II diabetes mellitus (HCC). Also,  has a past surgical history that includes Foot surgery; Cesarean section (2010); and nexplanon removal (07/2016)., family history includes Diabetes in her father; Heart failure in her other.,  reports that  has never smoked. she has never used smokeless tobacco. She reports that she does not drink alcohol or use drugs.  She has a current medication list which includes the following prescription(s): folic acid, metformin, and multivitamin-prenatal. Also, is allergic to chocolate; eggs or egg-derived products; peanut-containing drug products; and shellfish allergy.  Review of Systems  All other systems reviewed and are negative.   Objective: Vitals:   12/27/16 1043  BP: 120/80   Physical Exam  Constitutional: She is oriented to person, place, and time. She appears well-developed and well-nourished. No distress.  Genitourinary: Vagina normal and uterus normal. Pelvic exam was performed with patient supine. There is no rash, tenderness or lesion on the right labia. There is no rash, tenderness or lesion on the left labia. No erythema or bleeding in the vagina. Right adnexum does not display mass and does not display tenderness. Left adnexum does not display mass and does not display  tenderness. Cervix does not exhibit motion tenderness, discharge, polyp or nabothian cyst.   Uterus is mobile and midaxial. Uterus is not enlarged or exhibiting a mass.  Genitourinary Comments: Cervix not dilated, scant amount of brown old blood at back of vagina  Abdominal: Soft. She exhibits no distension. There is no tenderness.  Musculoskeletal: Normal range of motion.  Neurological: She is alert and oriented to person, place, and time. No cranial nerve deficit.  Skin: Skin is warm and dry.  Psychiatric: She has a normal mood and affect.   US done today by self. Pt informed that the ultrasound is considered a limited OB ultrasound and is not intended to be a complete ultrasound exam.  Patient also informed that the ultrasound is not being completed with the intent of assessing for fetal or placental anomalies or any pelvic abnormalities.  Explained that the purpose of today's ultrasound is to assess for  viability.  Patient acknowledges the purpose of the exam and the limitations of the study.   Findings include VIABLE IUP with FHT 170s on Doppler and visual.  +FM.  Vtx.    No cervical funneling.  Normal amniotic fluid volume.    No adnexal masses.  Assessment: 26 y.o. G2P1001 6124w2d 1. [redacted] weeks gestation of pregnancy +FHT by US today  2. First trimester bleeding Reassured by US today Monitor and pelvic rest advised Reschedule First Trimester Screen (no staff today for exam)  1) First trimester bleeding - incidence and clinical course of first trimester bleeding is discussed in detail with the patient today.  Approximately 1/3 of pregnancies ending in live  births experienced 1st trimester bleeding.  The amount of bleeding is variable and not necessarily predictive of outcome.  Sources may be cervical or uterine.  Subchorionic hemorrhages are a frequent concurrent findings on ultrasound and are followed expectantly.  These often absorb or regress spontaneously although risk for expansion  and further disruption of the utero-placental interface leading to miscarriage is possible.  There is no clearly documented benefit to limiting or modifying activity and sexual intercourse in altering clinic course of 1st trimester bleeding.    2) The patient is Rh pos, rhogam is therefore not indicated to decrease the risk rhesus alloimmunization.    3) Routine bleeding precautions were discussed with the patient prior the conclusion of today's visit.  Annamarie MajorPaul Donzella Carrol, MD, Merlinda FrederickFACOG [facility], [facility] 12/27/2016  11:21 AM

## 2016-12-29 ENCOUNTER — Other Ambulatory Visit: Payer: Self-pay | Admitting: Obstetrics & Gynecology

## 2016-12-29 ENCOUNTER — Ambulatory Visit (INDEPENDENT_AMBULATORY_CARE_PROVIDER_SITE_OTHER): Payer: 59 | Admitting: Obstetrics & Gynecology

## 2016-12-29 ENCOUNTER — Ambulatory Visit (INDEPENDENT_AMBULATORY_CARE_PROVIDER_SITE_OTHER): Payer: 59

## 2016-12-29 VITALS — BP 120/80 | Wt 218.0 lb

## 2016-12-29 DIAGNOSIS — Z3682 Encounter for antenatal screening for nuchal translucency: Secondary | ICD-10-CM | POA: Diagnosis not present

## 2016-12-29 DIAGNOSIS — Z3A13 13 weeks gestation of pregnancy: Secondary | ICD-10-CM

## 2016-12-29 DIAGNOSIS — O34219 Maternal care for unspecified type scar from previous cesarean delivery: Secondary | ICD-10-CM

## 2016-12-29 DIAGNOSIS — Z369 Encounter for antenatal screening, unspecified: Secondary | ICD-10-CM | POA: Diagnosis not present

## 2016-12-29 NOTE — Progress Notes (Signed)
No pain or bleeding Review of ULTRASOUND.    I have personally reviewed images and [facility]    Plan of management to be discussed with patient. First screen labs also today  Cheryl MajorPaul Shalanda Brogden, MD, Merlinda FrederickFACOG [facility], [facility] 12/29/2016  11:00 AM

## 2017-01-01 LAB — FIRST TRIMESTER SCREEN W/NT
CRL: 80.7 mm
DIA MoM: 0.85
DIA Value: 147 pg/mL
Gest Age-Collect: 13.3 weeks
Maternal Age At EDD: 29.3 yr
Nuchal Translucency MoM: 1.17
Nuchal Translucency: 2.3 mm
Number of Fetuses: 1
PAPP-A MoM: 1.7
PAPP-A Value: 1404 ng/mL
Test Results:: NEGATIVE
Weight: 218 [lb_av]
hCG MoM: 0.74
hCG Value: 46.4 IU/mL

## 2017-01-04 ENCOUNTER — Ambulatory Visit (INDEPENDENT_AMBULATORY_CARE_PROVIDER_SITE_OTHER): Payer: 59 | Admitting: Advanced Practice Midwife

## 2017-01-04 ENCOUNTER — Encounter: Payer: Self-pay | Admitting: Advanced Practice Midwife

## 2017-01-04 VITALS — BP 120/78 | Wt 222.0 lb

## 2017-01-04 DIAGNOSIS — Z3A14 14 weeks gestation of pregnancy: Secondary | ICD-10-CM

## 2017-01-04 DIAGNOSIS — O209 Hemorrhage in early pregnancy, unspecified: Secondary | ICD-10-CM

## 2017-01-04 NOTE — Progress Notes (Signed)
Having clots since this a.m.

## 2017-01-04 NOTE — Progress Notes (Signed)
Routine Prenatal Care Visit  Subjective  Dara HoyerBrittany L Safi is a 26 y.o. G2P1001 at 6353w3d being seen today for ongoing prenatal care.  She is currently monitored for the following issues for this high-risk pregnancy and has ACUT SUPPRATV OTITIS MEDIA W/O SPONT RUP EARDRUM; ALLERGIC RHINITIS; PEANUT ALLERGY; EGG ALLERGY; SHELLFISH ALLERGY; Diabetes mellitus, type 2 (HCC); Supervision of high risk pregnancy, antepartum, first trimester; Obesity (BMI 30.0-34.9); Pre-existing diabetes mellitus in pregnancy; and Previous cesarean delivery affecting pregnancy, antepartum on their problem list.  ----------------------------------------------------------------------------------- Patient reports bleeding. She noticed bleeding when wiping this morning and she has had some light bleeding throughout the day. She has only had 1 pad during the day. She denies recent intercourse.  Patient admits to good control of blood sugar. Contractions: Not present.  .   . Denies leaking of fluid.  ----------------------------------------------------------------------------------- The following portions of the patient's history were reviewed and updated as appropriate: allergies, current medications, past family history, past medical history, past social history, past surgical history and problem list. Problem list updated.   Objective  Blood pressure 120/78, weight 222 lb (100.7 kg), last menstrual period 08/30/2016. Pregravid weight 210 lb (95.3 kg) Total Weight Gain 12 lb (5.443 kg) Urinalysis:       Pad has about 1/4 of surface with brown/pink blood. Blood does not appear to be soaking the pad.  Fetal Status: Fetal Heart Rate (bpm): 165         General:  Alert, oriented and cooperative. Patient is in no acute distress.  Skin: Skin is warm and dry. No rash noted.   Cardiovascular: Normal heart rate noted  Respiratory: Normal respiratory effort, no problems with respiration noted  Abdomen: Soft, gravid, appropriate  for gestational age. Pain/Pressure: Absent     Pelvic:  Cervical exam deferred        Extremities: Normal range of motion.     Mental Status: Normal mood and affect. Normal behavior. Normal judgment and thought content.   Assessment   26 y.o. G2P1001 at 6953w3d by  07/02/2017, by Ultrasound presenting for work-in prenatal visit  Plan   SECOND Problems (from 11/10/16 to present)    Problem Noted Resolved   Pre-existing diabetes mellitus in pregnancy 12/13/2016 by Farrel ConnersGutierrez, Colleen, CNM No   Overview Signed 12/13/2016  9:42 PM by Farrel ConnersGutierrez, Colleen, CNM    Current Diabetic Medications:  Metformin  Arly.Keller[X ] Aspirin 81 mg daily after 12 weeks; discontinue after 36 weeks (? A2/B GDM)  Required Referrals for A1GDM or A2GDM: [ ]  Diabetes Education and Testing Supplies [ ]  Nutrition Cousult  For A2/B GDM or higher classes of DM [ ]  Diabetes Education and Testing Supplies [ ]  Nutrition Counsult [ ]  Fetal ECHO after 22-24 weeks  [ ]  Eye exam for retina evaluation  [ ]  Baseline EKG [ ]  US fetal growth every 4 weeks starting at 28 weeks [ ]  Twice weekly NST starting at [redacted] weeks gestation [ ]  Delivery planning contingent on fetal growth, AFI, glycemic control, and other co-morbidities but at least by 39 weeks  Baseline and surveillance labs [facility], refresh links as needed)  Lab Results  Component Value Date   CREATININE 0.71 03/13/2015   LABPROT 13.7 03/21/2008   No results found for: HGBA1C  Antenatal Testing Class of DM U/S NST/AFI DELIVERY  Diabetes   A1 - good control - O24.410    A2 - good control - O24.419      A2  - poor control or poor  compliance - O24.419, E11.65   (Macrosomia or polyhydramnios) **E11.65 is extra code for poor control**    A2/B - O24.919  and B-C O24.319  Poor control B-C or D-R-F-T - O24.319  or  Type I DM - O24.019  20-38  20-38  20-24-28-32-36   20-24-28-32-35-38//fetal echo  20-24-27-30-33-36-38//fetal echo  40  32//2 x  wk  32//2 x wk   32//2 x wk  28//BPP wkly then 32//2 x wk  40  39  PRN   39  PRN         Diabetes mellitus, type 2 (HCC) 11/10/2016 by Oswaldo ConroySchmid, Jacelyn Y, CNM No   Supervision of high risk pregnancy, antepartum, first trimester 11/10/2016 by Oswaldo ConroySchmid, Jacelyn Y, CNM No   Overview Addendum 12/13/2016  9:39 PM by Farrel ConnersGutierrez, Colleen, CNM    Clinic [facility] Prenatal Labs  Dating 6wk6d ultrasound Blood type:     Genetic Screen 1 Screen:    AFP:     Quad:     NIPS: Antibody:   Anatomic US  Rubella:   Varicella:    GTT Early:               Third trimester:  RPR:     Rhogam  HBsAg:     TDaP vaccine                       Flu Shot: HIV:     Baby Food                                GBS:   Contraception  Pap:  CBB     CS/VBAC    Support Person                  Preterm labor symptoms and general obstetric precautions including but not limited to vaginal bleeding, contractions, leaking of fluid and fetal movement were reviewed in detail with the patient.  Discontinue baby ASA temporarily while bleeding  Return for has f/u already scheduled.  Tresea MallJane Belkis Norbeck, CNM  01/04/2017 3:21 PM

## 2017-01-08 ENCOUNTER — Emergency Department: Payer: 59

## 2017-01-08 ENCOUNTER — Encounter: Payer: Self-pay | Admitting: Emergency Medicine

## 2017-01-08 ENCOUNTER — Emergency Department
Admission: EM | Admit: 2017-01-08 | Discharge: 2017-01-08 | Disposition: A | Discharge status: Home / Self Care | Payer: 59 | Attending: Emergency Medicine | Admitting: Emergency Medicine

## 2017-01-08 DIAGNOSIS — O209 Hemorrhage in early pregnancy, unspecified: Secondary | ICD-10-CM | POA: Diagnosis not present

## 2017-01-08 DIAGNOSIS — O26852 Spotting complicating pregnancy, second trimester: Secondary | ICD-10-CM

## 2017-01-08 DIAGNOSIS — Z3A15 15 weeks gestation of pregnancy: Secondary | ICD-10-CM | POA: Diagnosis not present

## 2017-01-08 DIAGNOSIS — O4402 Placenta previa specified as without hemorrhage, second trimester: Secondary | ICD-10-CM | POA: Diagnosis not present

## 2017-01-08 DIAGNOSIS — Z91010 Allergy to peanuts: Secondary | ICD-10-CM | POA: Diagnosis not present

## 2017-01-08 NOTE — ED Triage Notes (Signed)
Pt reports she is [redacted] weeks pregnant started to have vaginal bleeding on Sunday, was [facility] on Monday had fetal heart tones and was told the bleeding was due to the cervicis stretching. Pt taken to RM 5 for further evaluation.

## 2017-01-08 NOTE — Discharge Instructions (Signed)
Please follow up closely with obstetrics and gynecology Dr. Tiburcio PeaHarris.  Return to the emergency room if your bleeding worsens, you become weak and dizzy or lightheaded, you have an episode of passing out, develop severe bleeding such as more than 1 soaked pad per hour for more than 3 straight hours, develop abdominal or pelvic pain, fevers chills or other new concerns arise.

## 2017-01-08 NOTE — ED Provider Notes (Signed)
[facility] Emergency Department Provider Note   ____________________________________________   First MD Initiated Contact with Patient 01/08/17 [phone]     (approximate)  I have reviewed the triage vital signs and the nursing notes.   HISTORY  Chief Complaint Vaginal Bleeding    HPI Cheryl Carpenter is a 26 y.o. female G2P1  Patient reports she has been having some very light vaginal spotting for about 1 week.  She reports that she was seen for this [facility], had an examination and was told that she likely had some spotting from stressing of her cervix.  She is continue to have some very small amounts of spotting, she reports is just a very minimal a few drops at a time, never enough to require her to wear or use a pad.  Not having any pain with it.  Today while working, she noticed that she had a slight increase in this and had a very small amount of clot.  This prompted her to come for further evaluation.  No fevers or chills.  No trouble breathing.  No abdominal pain.  No cramping.  She has passed a small amount of clot.  No abnormal odor  Past Medical History:  Diagnosis Date  . Obesity (BMI 30-39.9)   . Previous cesarean section   . Type II diabetes mellitus [facility](HCC)     Patient Active Problem List   Diagnosis Date Noted  . Pre-existing diabetes mellitus in pregnancy 12/13/2016  . Previous cesarean delivery affecting pregnancy, antepartum 12/13/2016  . Diabetes mellitus, type 2 (HCC) 11/10/2016  . Supervision of high risk pregnancy, antepartum, first trimester 11/10/2016  . Obesity (BMI 30.0-34.9) 11/10/2016  . ACUT SUPPRATV OTITIS MEDIA W/O SPONT RUP EARDRUM 03/21/2010  . ALLERGIC RHINITIS 03/21/2010  . PEANUT ALLERGY 03/21/2010  . EGG ALLERGY 03/21/2010  . SHELLFISH ALLERGY 03/21/2010    Past Surgical History:  Procedure Laterality Date  . CESAREAN SECTION  2010   failed VAVD  . FOOT SURGERY    . nexplanon  removal  07/2016    Prior to Admission medications   Medication Sig Start Date End Date Taking? Authorizing Provider  folic acid (FOLVITE) 1 MG tablet Take 1 tablet (1 mg total) daily by mouth. 12/13/16   Farrel ConnersGutierrez, Colleen, CNM  metFORMIN (GLUCOPHAGE) 500 MG tablet Take by mouth 2 (two) times daily with a meal.    [provider]  Prenatal Vit-Fe Fumarate-FA (MULTIVITAMIN-PRENATAL) 27-0.8 MG TABS tablet Take 1 tablet daily at 12 noon by mouth.    [provider]    Allergies Chocolate; Eggs or egg-derived products; Peanut-containing drug products; and Shellfish allergy  Family History  Problem Relation Age of Onset  . Diabetes Father   . Heart failure Other     Social History Social History   Tobacco Use  . Smoking status: Never Smoker  . Smokeless tobacco: Never Used  Substance Use Topics  . Alcohol use: No  . Drug use: No    Review of Systems Constitutional: No fever/chills Eyes: No visual changes. ENT: No sore throat. Cardiovascular: Denies chest pain. Respiratory: Denies shortness of breath. Gastrointestinal: No abdominal pain.  No nausea, no vomiting.   Genitourinary: Negative for dysuria. Musculoskeletal: Negative for back pain. Skin: Negative for rash. Neurological: Negative for weakness    ____________________________________________   PHYSICAL EXAM:  VITAL SIGNS: ED Triage Vitals  Enc Vitals Group     BP 01/08/17 0425 (!) 141/81  Pulse Rate 01/08/17 0425 (!) 103     Resp 01/08/17 0425 16     Temp 01/08/17 0425 97.8 F (36.6 C)     Temp Source 01/08/17 0425 Oral     SpO2 01/08/17 0425 100 %     Weight 01/08/17 0425 222 lb (100.7 kg)     Height 01/08/17 0425 5\' 5"  (1.651 m)     Head Circumference --      Peak Flow --      Pain Score 01/08/17 0429 0     Pain Loc --      Pain Edu? --      Excl. in GC? --     Constitutional: Alert and oriented. Well appearing and in no acute distress.  She is extremely pleasant and well  mannered. Eyes: Conjunctivae are normal. Head: Atraumatic. Nose: No congestion/rhinnorhea. Mouth/Throat: Mucous membranes are moist. Neck: No stridor.   Cardiovascular: Normal rate, regular rhythm. Grossly normal heart sounds.  Good peripheral circulation. Respiratory: Normal respiratory effort.  No retractions. Lungs CTAB. Gastrointestinal: Soft and nontender. No distention except she does appear to be gravid with the fundus about 5 cm above the pelvic brim with no tenderness. Musculoskeletal: No lower extremity tenderness nor edema.   Neurologic:  Normal speech and language. No gross focal neurologic deficits are appreciated.  Skin:  Skin is warm, dry and intact. No rash noted. Psychiatric: Mood and affect are normal. Speech and behavior are normal.  ____________________________________________   LABS (all labs ordered are listed, but only abnormal results are displayed)  Labs Reviewed - No data to display ____________________________________________  EKG   ____________________________________________  RADIOLOGY  US Ob Limited > 14 Wks  Result Date: 01/08/2017 CLINICAL DATA:  Acute onset of vaginal bleeding. EXAM: LIMITED OBSTETRIC ULTRASOUND FINDINGS: Number of Fetuses: 1 Heart Rate:  157 bpm Movement: Yes Presentation: Breech Placental Location: Anterior Previa: Marginal Amniotic Fluid (Subjective):  Within normal limits. BPD:  2.95 cm       15 w 3 d MATERNAL FINDINGS: Cervix:  Appears closed. Uterus/Adnexae: No abnormality visualized. A 2.5 cm left ovarian cystic focus likely reflects a normal follicle. IMPRESSION: Single live intrauterine pregnancy noted, with a biparietal diameter of 3.0 cm, corresponding to a gestational age of [redacted] weeks 3 days. This matches the gestational age of [redacted] weeks 0 days by the first ultrasound, reflecting an estimated date of delivery of July 02, 2017. Marginal placental previa will likely resolve as the pregnancy progresses. Cervix remains closed at  this time. This exam is performed on an emergent basis and does not comprehensively evaluate fetal size, dating, or anatomy; follow-up complete OB US should be considered if further fetal assessment is warranted. Electronically Signed   By: Roanna Raider M.D.   On: 01/08/2017 05:31    Results reviewed, ultrasound demonstrates positive intrauterine pregnancy.  Closed cervix with a small potential for placental previa which is marginal.  Discussed with Dr. Jean Rosenthal ____________________________________________   PROCEDURES  Procedure(s) performed: None  Procedures  Critical Care performed: No  ____________________________________________   INITIAL IMPRESSION / ASSESSMENT AND PLAN / ED COURSE  Pertinent labs & imaging results that were available during my care of the patient were reviewed by me and considered in my medical decision making (see chart for details).  Patient in her second trimester presents for evaluation of vaginal spotting.  Review of previous records indicates that she is Rh+.  Hemodynamically stable reporting small amount of bleeding with a slight increase at night while at  work.  She appears in no distress and a she has a very reassuring given her vaginal spotting during pregnancy with plan to reevaluate with a ultrasound for etiology.  No infectious symptoms, she recently has been seen by her OB for the same, and I do not believe a pelvic exam would contribute to further evaluation at this time especially not wishing to perform unneeded pelvic exams during pregnancy.  Clinical Course as of Jan 09 736  Sat Jan 08, 2017  0507 Checked on patient, not in room I believe she is an ultrasound  [MQ]  [phone] Case, care, ultrasound discussed with Dr. Jean RosenthalJackson. Advises close follow-up [facility]. Limited lifting for next couple of days.  Return precautions and treatment recommendations and follow-up discussed with the patient who is agreeable with the plan.   Patient alert,  oriented.   [MQ]    Clinical Course User Index [MQ] Sharyn CreamerQuale, Mark, MD   Symptoms and ultrasound appear consistent with marginal or small amount of placenta previa likely explaining her a small amount of bleeding.  Discussed with her careful return precautions, follow-up, and work modification recommendations for the next couple of days and to follow-up with Dr. Jean RosenthalJackson or Dr. Tiburcio PeaHarris.  Patient is agreeable  ____________________________________________   FINAL CLINICAL IMPRESSION(S) / ED DIAGNOSES  Final diagnoses:  Placenta previa in second trimester  Spotting affecting pregnancy in second trimester      NEW MEDICATIONS STARTED DURING THIS VISIT:  This SmartLink is deprecated. Use AVSMEDLIST instead to display the medication list for a patient.   Note:  This document was prepared using Dragon voice recognition software and may include unintentional dictation errors.     Sharyn CreamerQuale, Mark, MD 01/08/17 [phone]

## 2017-01-08 NOTE — ED Notes (Signed)
Pt returned to ED Rm 5 from US at this time. 

## 2017-01-08 NOTE — ED Notes (Signed)
Patient transported to Ultrasound at this time. 

## 2017-01-27 ENCOUNTER — Ambulatory Visit (INDEPENDENT_AMBULATORY_CARE_PROVIDER_SITE_OTHER): Payer: 59 | Admitting: Obstetrics & Gynecology

## 2017-01-27 VITALS — BP 120/80 | Wt 223.0 lb

## 2017-01-27 DIAGNOSIS — Z3A17 17 weeks gestation of pregnancy: Secondary | ICD-10-CM

## 2017-01-27 DIAGNOSIS — E119 Type 2 diabetes mellitus without complications: Secondary | ICD-10-CM

## 2017-01-27 DIAGNOSIS — O34219 Maternal care for unspecified type scar from previous cesarean delivery: Secondary | ICD-10-CM

## 2017-01-27 DIAGNOSIS — O0991 Supervision of high risk pregnancy, unspecified, first trimester: Secondary | ICD-10-CM

## 2017-01-27 NOTE — Progress Notes (Signed)
  Subjective  Fetal Movement? yes Contractions? no Leaking Fluid? no Vaginal Bleeding? A couple of episodes early Dec; has known low placenta.  May be assoc w sex.  No recent pain of bleeding  Objective  BP 120/80   Wt 223 lb (101.2 kg)   LMP 08/30/2016 (Approximate)   BMI 37.11 kg/m  General: NAD Pumonary: no increased work of breathing Abdomen: gravid, non-tender Extremities: no edema Psychiatric: mood appropriate, affect full  Assessment  26 y.o. G2P1001 at 6578w5d by  07/02/2017, by Ultrasound presenting for routine prenatal visit  Plan   Problem List Items Addressed This Visit      Endocrine   Diabetes mellitus, type 2 (HCC)     Other   Supervision of high risk pregnancy, antepartum, first trimester   Relevant Orders   US OB Comp + 14 Wk   Previous cesarean delivery affecting pregnancy, antepartum    Other Visit Diagnoses    [redacted] weeks gestation of pregnancy    -  Primary    US nv; placenta risks discussed if persists into second/third trimester Consider pelvic rest DM under good control on Metformin at this time.  Annamarie MajorPaul Harris, MD, Merlinda FrederickFACOG [facility], [facility] 01/27/2017  4:54 PM

## 2017-02-09 ENCOUNTER — Ambulatory Visit (INDEPENDENT_AMBULATORY_CARE_PROVIDER_SITE_OTHER): Payer: 59

## 2017-02-09 ENCOUNTER — Ambulatory Visit (INDEPENDENT_AMBULATORY_CARE_PROVIDER_SITE_OTHER): Payer: 59 | Admitting: Obstetrics & Gynecology

## 2017-02-09 VITALS — BP 110/80 | Wt 221.0 lb

## 2017-02-09 DIAGNOSIS — O0991 Supervision of high risk pregnancy, unspecified, first trimester: Secondary | ICD-10-CM | POA: Diagnosis not present

## 2017-02-09 DIAGNOSIS — O34219 Maternal care for unspecified type scar from previous cesarean delivery: Secondary | ICD-10-CM

## 2017-02-09 DIAGNOSIS — Z362 Encounter for other antenatal screening follow-up: Secondary | ICD-10-CM | POA: Diagnosis not present

## 2017-02-09 DIAGNOSIS — O24312 Unspecified pre-existing diabetes mellitus in pregnancy, second trimester: Secondary | ICD-10-CM

## 2017-02-09 DIAGNOSIS — Z3A19 19 weeks gestation of pregnancy: Secondary | ICD-10-CM

## 2017-02-09 NOTE — Progress Notes (Signed)
  Subjective  Fetal Movement? yes Contractions? no Leaking Fluid? no Vaginal Bleeding? no  Objective  BP 110/80   Wt 221 lb (100.2 kg)   LMP 08/30/2016 (Approximate)   BMI 36.78 kg/m  General: NAD Pumonary: no increased work of breathing Abdomen: gravid, non-tender Extremities: no edema Psychiatric: mood appropriate, affect full Review of ULTRASOUND.    I have personally reviewed images and report of [facility].    Plan of management to be discussed with patient.    Resolved low lying placenta  Assessment  27 y.o. G2P1001 at 8881w4d by  07/02/2017, by Ultrasound presenting for routine prenatal visit  Plan   Problem List Items Addressed This Visit      Endocrine   Pre-existing diabetes mellitus in pregnancy     Other   Supervision of high risk pregnancy, antepartum, first trimester   Previous cesarean delivery affecting pregnancy, antepartum    Other Visit Diagnoses    [redacted] weeks gestation of pregnancy    -  Primary    Cont Metformin.  FBS 80-90. US and labs 2 mos. CS June 6, prefers over VBAC.  OB/GYN  Counseling Note  27 y.o. G2P1001 at 7281w4d with Estimated Date of Delivery: 07/02/17 was seen today in office to discuss trial of labor after cesarean section (TOLAC) versus elective repeat cesarean delivery (ERCD). The following risks were discussed with the patient.  Risk of uterine rupture at term is 0.78 percent with TOLAC and 0.22 percent with ERCD. 1 in 10 uterine ruptures will result in neonatal death or neurological injury. The benefits of a trial of labor after cesarean (TOLAC) resulting in a vaginal birth after cesarean (VBAC) include the following: shorter length of hospital stay and postpartum recovery (in most cases); fewer complications, such as postpartum fever, wound or uterine infection, thromboembolism (blood clots in the leg or lung), need for blood transfusion and fewer neonatal breathing problems.  The risks of an attempted VBAC or  TOLAC include the following: Risk of failed trial of labor after cesarean (TOLAC) without a vaginal birth after cesarean (VBAC) resulting in repeat cesarean delivery (RCD) in about 20 to 40 percent of women who attempt VBAC.  Risk of rupture of uterus resulting in an emergency cesarean delivery. The risk of uterine rupture may be related in part to the type of uterine incision made during the first cesarean delivery. A previous transverse uterine incision has the lowest risk of rupture (0.2 to 1.5 percent risk). Vertical or T-shaped uterine incisions have a higher risk of uterine rupture (4 to 9 percent risk)The risk of fetal death is very low with both VBAC and elective repeat cesarean delivery (ERCD), but the likelihood of fetal death is higher with VBAC than with ERCD. Maternal death is very rare with either type of delivery.  The risks of an elective repeat cesarean delivery (ERCD) were reviewed with the patient including but not limited to: 02/998 risk of uterine rupture which could have serious consequences, bleeding which may require transfusion; infection which may require antibiotics; injury to bowel, bladder or other surrounding organs (bowel, bladder, ureters); injury to the fetus; need for additional procedures including hysterectomy in the event of a life-threatening hemorrhage; thromboembolic phenomenon; abnormal placentation; incisional problems; death and other postoperative or anesthesia complications.    Annamarie MajorPaul Lynkin Saini, MD, Merlinda FrederickFACOG [facility], [facility] 02/09/2017  2:05 PM

## 2017-02-10 ENCOUNTER — Telehealth: Payer: Self-pay | Admitting: Obstetrics & Gynecology

## 2017-02-10 ENCOUNTER — Encounter: Payer: 59 | Admitting: Obstetrics & Gynecology

## 2017-02-10 ENCOUNTER — Other Ambulatory Visit: Payer: 59

## 2017-02-10 NOTE — Telephone Encounter (Signed)
-----   Message from Nadara Mustardobert P Harris, MD sent at 02/09/2017  2:07 PM EST ----- Regarding: surgery Surgery Booking Request Patient Full Name:  Cheryl HoyerBrittany L Mckelvin  MRN: [id number]  DOB: [birth year]  Surgeon: Letitia Libraobert Paul Harris, MD  Requested Surgery Date and Time: 06/30/17 Primary Diagnosis AND Code: Term pregnancy, Diabetes, Prior cesarean Secondary Diagnosis and Code:  Surgical Procedure: Cesarean Section L&D Notification: No Admission Status: surgery admit Length of Surgery: 1 Special Case Needs: no H&P: yes (date) Phone Interview???: no Interpreter: Language:  Medical Clearance: no Special Scheduling Instructions: no

## 2017-02-10 NOTE — Telephone Encounter (Signed)
[facility] @ [facility]:20[facility], Pre-admit Testing afterwards, and OR on 06/30/17.

## 2017-03-09 ENCOUNTER — Ambulatory Visit (INDEPENDENT_AMBULATORY_CARE_PROVIDER_SITE_OTHER): Payer: 59 | Admitting: Obstetrics & Gynecology

## 2017-03-09 VITALS — BP 110/70 | Wt 228.0 lb

## 2017-03-09 DIAGNOSIS — Z3A23 23 weeks gestation of pregnancy: Secondary | ICD-10-CM

## 2017-03-09 DIAGNOSIS — O0991 Supervision of high risk pregnancy, unspecified, first trimester: Secondary | ICD-10-CM

## 2017-03-09 DIAGNOSIS — O24312 Unspecified pre-existing diabetes mellitus in pregnancy, second trimester: Secondary | ICD-10-CM

## 2017-03-09 DIAGNOSIS — E66811 Obesity, class 1: Secondary | ICD-10-CM

## 2017-03-09 DIAGNOSIS — O34219 Maternal care for unspecified type scar from previous cesarean delivery: Secondary | ICD-10-CM

## 2017-03-09 DIAGNOSIS — E669 Obesity, unspecified: Secondary | ICD-10-CM

## 2017-03-09 NOTE — Progress Notes (Signed)
  Subjective  Fetal Movement? yes Contractions? no Leaking Fluid? no Vaginal Bleeding? no BS 80- 90 fasting.  Tol Metformin well. Objective  BP 110/70   Wt 228 lb (103.4 kg)   LMP 08/30/2016 (Approximate)   BMI 37.94 kg/m  General: NAD Pumonary: no increased work of breathing Abdomen: gravid, non-tender Extremities: no edema Psychiatric: mood appropriate, affect full  Assessment  27 y.o. G2P1001 at 3540w4d by  07/02/2017, by Ultrasound presenting for routine prenatal visit  Plan   Problem List Items Addressed This Visit      Endocrine   Pre-existing diabetes mellitus in pregnancy   Relevant Orders   US OB Follow Up     Other   Supervision of high risk pregnancy, antepartum, first trimester   Relevant Orders   US OB Follow Up   Obesity (BMI 30.0-34.9)   Relevant Orders   US OB Follow Up   Previous cesarean delivery affecting pregnancy, antepartum    Other Visit Diagnoses    [redacted] weeks gestation of pregnancy    -  Primary    Desires CS.  Also desires tubal.      Sign tubal papers nv.    Pros and cons, failure rates, permanancy discussed. DM(B), cont Metformin.  Current Diabetic Medications:  Metformin  [ x] Aspirin 81 mg daily after 12 weeks; discontinue after 36 weeks (? A2/B GDM)  For A2/B GDM or higher classes of DM [ x] Diabetes Education and Child psychotherapistTesting Supplies [ x] Nutrition Counsult [ ordered] Fetal ECHO after 22-24 weeks  [ ordered] US fetal growth every 4 weeks starting at 28 weeks [ x] Twice weekly NST starting at [redacted] weeks gestation [ x] Delivery planning- repeat CS planned; contingent on fetal growth, AFI, glycemic control, and other co-morbidities but at least by 39 weeks  Baseline and surveillance labs ([facility], refresh links as needed)  Lab Results  Component Value Date   CREATININE 0.58 12/13/2016   AST 12 12/13/2016   ALT 9 12/13/2016   LABPROT 13.7 03/21/2008   Lab Results  Component Value Date   HGBA1C 6.0 (H) 12/13/2016    Antenatal Testing Class of DM U/S NST/AFI DELIVERY  Diabetes  A2/B - O24.919  and B-C O24.319  Poor control B-C or D-R-F-T - O24.319  or  Type I DM - O24.019   20-24-28-32-35-38//fetal echo  20-24-27-30-33-36-38//fetal echo   32//2 x wk  28//BPP wkly then 32//2 x wk   39  PRN   Annamarie MajorPaul Lailany Enoch, MD, Merlinda FrederickFACOG [facility], [facility] 03/09/2017  10:44 AM

## 2017-03-09 NOTE — Patient Instructions (Addendum)
What You Need to Know About Female Sterilization Female sterilization is surgery to prevent pregnancy. In this surgery, the fallopian tubes are either blocked or closed off. This prevents eggs from reaching the uterus so that the eggs cannot be fertilized by sperm and you cannot get pregnant. Sterilization is permanent. It should only be done if you are sure that you do not want to be able to have children. What are the sterilization surgery options? There are several kinds of female sterilization surgeries. They include:  Laparoscopic tubal ligation. In this surgery, the fallopian tubes are tied off, sealed with heat, or blocked with a clip, ring, or clamp. A small portion of each fallopian tube may also be removed. This surgery is done through several small cuts (incisions).  Postpartum tubal ligation. This is also called a mini-laparotomy. This surgery is done right after childbirth or 1 or 2 days after childbirth. In this surgery, the fallopian tubes are tied off, sealed with heat, or blocked with a clip, ring, or clamp. A small portion of each fallopian tube may also be removed. The surgery is done through a single incision.  Hysteroscopic sterilization. In this surgery, a tiny, spring-like coil is inserted through the cervix and uterus into the fallopian tubes. The coil causes scarring, which blocks the tubes. After the surgery, contraception should be used for 3 months to allow the scar tissue to form completely.  Is sterilization safe? Generally, sterilization is safe. Complications are rare. However, there are risks. They include:  Bleeding.  Infection.  Reaction to medicine used during the procedure.  Injury to surrounding organs.  Failure of the procedure.  How effective is sterilization? Sterilization is nearly 100% effective, but it can fail. Also, the fallopian tubes can grow back together over time. If this happens, you will be able to get pregnant again. Women who have had  this procedure have a higher chance of having an ectopic pregnancy. An ectopic pregnancy is a pregnancy that happens outside of the uterus. This kind of pregnancy is unsuccessful and can lead to serious bleeding if it is not treated. What are the benefits?  It is usually effective for a lifetime.  It is usually safe.  It does not have the drawbacks of other types of birth control: That means: ? Your hormones are not affected. Because of this, your menstrual periods, sexual desire, and sexual performance will not be affected. ? There are no side effects. What are the drawbacks?  If you change your mind and decide that you want to have children, you may not be able to. Sterilization may be reversed, but a reversal is not always successful.  It does not provide protection against STDs (sexually transmitted diseases).  It increases the chance of having an ectopic pregnancy. This information is not intended to replace advice given to you by your health care provider. Make sure you discuss any questions you have with your health care provider. Document Released: 06/30/2007 Document Revised: 09/04/2015 Document Reviewed: 10/08/2014 Elsevier Interactive Patient Education  2018 [employer]  

## 2017-03-25 ENCOUNTER — Telehealth: Payer: Self-pay

## 2017-03-25 NOTE — Telephone Encounter (Signed)
Pt 25wks, what can she take for nasal congestion, cough, fever.  [phone].  Adv plain robitussin/plain mussinex, plain sudafed, Hall's cough drops, e.s. Tylenol, sip hot beverages/soup, push fluids and rest.

## 2017-03-28 DIAGNOSIS — O24112 Pre-existing diabetes mellitus, type 2, in pregnancy, second trimester: Secondary | ICD-10-CM | POA: Diagnosis not present

## 2017-03-28 DIAGNOSIS — Z3A26 26 weeks gestation of pregnancy: Secondary | ICD-10-CM | POA: Diagnosis not present

## 2017-04-06 ENCOUNTER — Ambulatory Visit (INDEPENDENT_AMBULATORY_CARE_PROVIDER_SITE_OTHER): Payer: 59 | Admitting: Obstetrics & Gynecology

## 2017-04-06 ENCOUNTER — Ambulatory Visit (INDEPENDENT_AMBULATORY_CARE_PROVIDER_SITE_OTHER): Payer: 59

## 2017-04-06 VITALS — BP 110/70 | Wt 230.0 lb

## 2017-04-06 DIAGNOSIS — E669 Obesity, unspecified: Secondary | ICD-10-CM

## 2017-04-06 DIAGNOSIS — O24313 Unspecified pre-existing diabetes mellitus in pregnancy, third trimester: Secondary | ICD-10-CM

## 2017-04-06 DIAGNOSIS — O0991 Supervision of high risk pregnancy, unspecified, first trimester: Secondary | ICD-10-CM

## 2017-04-06 DIAGNOSIS — Z3A27 27 weeks gestation of pregnancy: Secondary | ICD-10-CM

## 2017-04-06 DIAGNOSIS — O24312 Unspecified pre-existing diabetes mellitus in pregnancy, second trimester: Secondary | ICD-10-CM

## 2017-04-06 DIAGNOSIS — O34219 Maternal care for unspecified type scar from previous cesarean delivery: Secondary | ICD-10-CM

## 2017-04-06 NOTE — Progress Notes (Signed)
  Subjective  Fetal Movement? yes Contractions? no Leaking Fluid? no Vaginal Bleeding? no  Objective  BP 110/70   Wt 230 lb (104.3 kg)   LMP 08/30/2016 (Approximate)   BMI 38.27 kg/m  General: NAD Pumonary: no increased work of breathing Abdomen: gravid, non-tender Extremities: no edema Psychiatric: mood appropriate, affect full  Assessment  27 y.o. G2P1001 at [redacted]w[redacted]d by  07/02/2017, by Ultrasound presenting for routine prenatal visit  Plan   Problem List Items Addressed This Visit      Endocrine   Pre-existing diabetes mellitus in pregnancy     Other   Supervision of high risk pregnancy, antepartum, first trimester   Obesity (BMI 30.0-34.9)   Previous cesarean delivery affecting pregnancy, antepartum    Other Visit Diagnoses    [redacted] weeks gestation of pregnancy    -  Primary    Review of ULTRASOUND.    I have personally reviewed images and report [facility]    Plan of management to be discussed with patient.    Growth 62%, AFI normal.  DM- BS 70-80s for fasting and 2 hour PP.  Metformin CS and BTL planned; papers signed today for BTL.  Annamarie MajorPaul Harris, MD, Merlinda FrederickFACOG [facility], [facility] 04/06/2017  10:26 AM

## 2017-04-20 ENCOUNTER — Ambulatory Visit (INDEPENDENT_AMBULATORY_CARE_PROVIDER_SITE_OTHER): Payer: 59 | Admitting: Obstetrics & Gynecology

## 2017-04-20 VITALS — BP 110/70 | Wt 234.0 lb

## 2017-04-20 DIAGNOSIS — O99213 Obesity complicating pregnancy, third trimester: Secondary | ICD-10-CM | POA: Diagnosis not present

## 2017-04-20 DIAGNOSIS — O0993 Supervision of high risk pregnancy, unspecified, third trimester: Secondary | ICD-10-CM | POA: Diagnosis not present

## 2017-04-20 DIAGNOSIS — O24313 Unspecified pre-existing diabetes mellitus in pregnancy, third trimester: Secondary | ICD-10-CM | POA: Diagnosis not present

## 2017-04-20 DIAGNOSIS — Z3A13 13 weeks gestation of pregnancy: Secondary | ICD-10-CM | POA: Diagnosis not present

## 2017-04-20 DIAGNOSIS — Z3A29 29 weeks gestation of pregnancy: Secondary | ICD-10-CM | POA: Diagnosis not present

## 2017-04-20 DIAGNOSIS — O34219 Maternal care for unspecified type scar from previous cesarean delivery: Secondary | ICD-10-CM | POA: Diagnosis not present

## 2017-04-20 MED ORDER — TETANUS-DIPHTH-ACELL PERTUSSIS 5-2.5-18.5 LF-MCG/0.5 IM SUSP
0.5000 mL | Freq: Once | INTRAMUSCULAR | Status: AC
  Administered 2017-04-20: 0.5 mL via INTRAMUSCULAR

## 2017-04-20 NOTE — Progress Notes (Signed)
Prenatal Visit Note Date: 04/20/2017 Clinic: [facility]  Subjective:  Dara HoyerBrittany L Ferrara is a 27 y.o. G2P1001 at 2258w4d being seen today for ongoing prenatal care.  She is currently monitored for the following issues for this high-risk pregnancy and has ACUT SUPPRATV OTITIS MEDIA W/O SPONT RUP EARDRUM; ALLERGIC RHINITIS; PEANUT ALLERGY; EGG ALLERGY; SHELLFISH ALLERGY; Diabetes mellitus, type 2 (HCC); Supervision of high risk pregnancy, antepartum, first trimester; Obesity (BMI 30.0-34.9); Pre-existing diabetes mellitus in pregnancy; and Previous cesarean delivery affecting pregnancy, antepartum on their problem list.  Patient reports no complaints.   Contractions: Not present. Vag. Bleeding: None.  Movement: Present. Denies leaking of fluid.   The following portions of the patient's history were reviewed and updated as appropriate: allergies, current medications, past family history, past medical history, past social history, past surgical history and problem list. Problem list updated.  Objective:   Vitals:   04/20/17 0817  BP: 110/70  Weight: 234 lb (106.1 kg)    Fetal Status:     Movement: Present     General:  Alert, oriented and cooperative. Patient is in no acute distress.  Skin: Skin is warm and dry. No rash noted.   Cardiovascular: Normal heart rate noted  Respiratory: Normal respiratory effort, no problems with respiration noted  Abdomen: Soft, gravid, appropriate for gestational age. Pain/Pressure: Absent     Pelvic:  Cervical exam deferred        Extremities: Normal range of motion.     Mental Status: Normal mood and affect. Normal behavior. Normal judgment and thought content.   Urinalysis: Urine Protein: Negative Urine Glucose: Negative  Assessment and Plan:  Pregnancy: G2P1001 at 6758w4d  1. [redacted] weeks gestation of pregnancy TDaP  2. Supervision of high risk pregnancy in third trimester - US OB Follow Up; Future  3. Pre-existing diabetes mellitus during pregnancy in third  trimester - BS 80-90s.  Metformin. - US OB Follow Up; Future  4. Previous cesarean delivery affecting pregnancy, antepartum Plans CS and BTL  5. Obesity affecting pregnancy in third trimester - Baby ASA - US OB Follow Up; Future  Preterm labor symptoms and general obstetric precautions including but not limited to vaginal bleeding, contractions, leaking of fluid and fetal movement were reviewed in detail with the patient. Please refer to After Visit Summary for other counseling recommendations.  Return in about 2 weeks (around 05/04/2017) for HROB w growth OB US.  Current Diabetic Medications:  Metformin  [x ] Aspirin 81 mg daily after 12 weeks; discontinue after 36 weeks (? A2/B GDM)  For A2/B GDM or higher classes of DM [x ] Diabetes Education and Testing Supplies [x ] Nutrition Counsult [x ] Fetal ECHO after 22-24 weeks  [ ]  Eye exam for retina evaluation  x[ ]  Baseline EKG [x ] US fetal growth every 4 weeks starting at 28 weeks [x ] Twice weekly NST starting at [redacted] weeks gestation [x ] Delivery planning contingent on fetal growth, AFI, glycemic control, and other co-morbidities but at least by 39 weeks  Baseline and surveillance labs ([facility], refresh links as needed)  Lab Results  Component Value Date   CREATININE 0.58 12/13/2016   AST 12 12/13/2016   ALT 9 12/13/2016   LABPROT 13.7 03/21/2008   Lab Results  Component Value Date   HGBA1C 6.0 (H) 12/13/2016    Antenatal Testing Class of DM U/S NST/AFI DELIVERY  Diabetes   A1 - good control - O24.410    A2 - good control -  O24.419      A2  - poor control or poor compliance - O24.419, E11.65   (Macrosomia or polyhydramnios) **E11.65 is extra code for poor control**    A2/B - O24.919  and B-C O24.319  Poor control B-C or D-R-F-T - O24.319  or  Type I DM - O24.019  20-38  20-38  20-24-28-32-36   20-24-28-32-35-38//fetal echo  20-24-27-30-33-36-38//fetal echo  40  32//2 x wk  32//2 x  wk   32//2 x wk  28//BPP wkly then 32//2 x wk  40  39  PRN   39  PRN    Annamarie Major, MD, [facility], [facility] 04/20/2017  8:45 AM

## 2017-04-20 NOTE — Patient Instructions (Signed)
Third Trimester of Pregnancy The third trimester is from week 28 through week 40 (months 7 through 9). The third trimester is a time when the unborn baby (fetus) is growing rapidly. At the end of the ninth month, the fetus is about 20 inches in length and weighs 6-10 pounds. Body changes during your third trimester Your body will continue to go through many changes during pregnancy. The changes vary from woman to woman. During the third trimester:  Your weight will continue to increase. You can expect to gain 25-35 pounds (11-16 kg) by the end of the pregnancy.  You may begin to get stretch marks on your hips, abdomen, and breasts.  You may urinate more often because the fetus is moving lower into your pelvis and pressing on your bladder.  You may develop or continue to have heartburn. This is caused by increased hormones that slow down muscles in the digestive tract.  You may develop or continue to have constipation because increased hormones slow digestion and cause the muscles that push waste through your intestines to relax.  You may develop hemorrhoids. These are swollen veins (varicose veins) in the rectum that can itch or be painful.  You may develop swollen, bulging veins (varicose veins) in your legs.  You may have increased body aches in the pelvis, back, or thighs. This is due to weight gain and increased hormones that are relaxing your joints.  You may have changes in your hair. These can include thickening of your hair, rapid growth, and changes in texture. Some women also have hair loss during or after pregnancy, or hair that feels dry or thin. Your hair will most likely return to normal after your baby is born.  Your breasts will continue to grow and they will continue to become tender. A yellow fluid (colostrum) may leak from your breasts. This is the first milk you are producing for your baby.  Your belly button may stick out.  You may notice more swelling in your hands,  face, or ankles.  You may have increased tingling or numbness in your hands, arms, and legs. The skin on your belly may also feel numb.  You may feel short of breath because of your expanding uterus.  You may have more problems sleeping. This can be caused by the size of your belly, increased need to urinate, and an increase in your body's metabolism.  You may notice the fetus "dropping," or moving lower in your abdomen (lightening).  You may have increased vaginal discharge.  You may notice your joints feel loose and you may have pain around your pelvic bone.  What to expect at prenatal visits You will have prenatal exams every 2 weeks until week 36. Then you will have weekly prenatal exams. During a routine prenatal visit:  You will be weighed to make sure you and the baby are growing normally.  Your blood pressure will be taken.  Your abdomen will be measured to track your baby's growth.  The fetal heartbeat will be listened to.  Any test results from the previous visit will be discussed.  You may have a cervical check near your due date to see if your cervix has softened or thinned (effaced).  You will be tested for Group B streptococcus. This happens between 35 and 37 weeks.  Your health care provider may ask you:  What your birth plan is.  How you are feeling.  If you are feeling the baby move.  If you have had   any abnormal symptoms, such as leaking fluid, bleeding, severe headaches, or abdominal cramping.  If you are using any tobacco products, including cigarettes, chewing tobacco, and electronic cigarettes.  If you have any questions.  Other tests or screenings that may be performed during your third trimester include:  Blood tests that check for low iron levels (anemia).  Fetal testing to check the health, activity level, and growth of the fetus. Testing is done if you have certain medical conditions or if there are problems during the  pregnancy.  Nonstress test (NST). This test checks the health of your baby to make sure there are no signs of problems, such as the baby not getting enough oxygen. During this test, a belt is placed around your belly. The baby is made to move, and its heart rate is monitored during movement.  What is false labor? False labor is a condition in which you feel small, irregular tightenings of the muscles in the womb (contractions) that usually go away with rest, changing position, or drinking water. These are called Braxton Hicks contractions. Contractions may last for hours, days, or even weeks before true labor sets in. If contractions come at regular intervals, become more frequent, increase in intensity, or become painful, you should see your health care provider. What are the signs of labor?  Abdominal cramps.  Regular contractions that start at 10 minutes apart and become stronger and more frequent with time.  Contractions that start on the top of the uterus and spread down to the lower abdomen and back.  Increased pelvic pressure and dull back pain.  A watery or bloody mucus discharge that comes from the vagina.  Leaking of amniotic fluid. This is also known as your "water breaking." It could be a slow trickle or a gush. Let your health care provider know if it has a color or strange odor. If you have any of these signs, call your health care provider right away, even if it is before your due date. Follow these instructions at home: Medicines  Follow your health care provider's instructions regarding medicine use. Specific medicines may be either safe or unsafe to take during pregnancy.  Take a prenatal vitamin that contains at least 600 micrograms (mcg) of folic acid.  If you develop constipation, try taking a stool softener if your health care provider approves. Eating and drinking  Eat a balanced diet that includes fresh fruits and vegetables, whole grains, good sources of protein  such as meat, eggs, or tofu, and low-fat dairy. Your health care provider will help you determine the amount of weight gain that is right for you.  Avoid raw meat and uncooked cheese. These carry germs that can cause birth defects in the baby.  If you have low calcium intake from food, talk to your health care provider about whether you should take a daily calcium supplement.  Eat four or five small meals rather than three large meals a day.  Limit foods that are high in fat and processed sugars, such as fried and sweet foods.  To prevent constipation: ? Drink enough fluid to keep your urine clear or pale yellow. ? Eat foods that are high in fiber, such as fresh fruits and vegetables, whole grains, and beans. Activity  Exercise only as directed by your health care provider. Most women can continue their usual exercise routine during pregnancy. Try to exercise for 30 minutes at least 5 days a week. Stop exercising if you experience uterine contractions.  Avoid heavy   lifting.  Do not exercise in extreme heat or humidity, or at high altitudes.  Wear low-heel, comfortable shoes.  Practice good posture.  You may continue to have sex unless your health care provider tells you otherwise. Relieving pain and discomfort  Take frequent breaks and rest with your legs elevated if you have leg cramps or low back pain.  Take warm sitz baths to soothe any pain or discomfort caused by hemorrhoids. Use hemorrhoid cream if your health care provider approves.  Wear a good support bra to prevent discomfort from breast tenderness.  If you develop varicose veins: ? Wear support pantyhose or compression stockings as told by your healthcare provider. ? Elevate your feet for 15 minutes, 3-4 times a day. Prenatal care  Write down your questions. Take them to your prenatal visits.  Keep all your prenatal visits as told by your health care provider. This is important. Safety  Wear your seat belt at  all times when driving.  Make a list of emergency phone numbers, including numbers for family, friends, the hospital, and police and fire departments. General instructions  Avoid cat litter boxes and soil used by cats. These carry germs that can cause birth defects in the baby. If you have a cat, ask someone to clean the litter box for you.  Do not travel far distances unless it is absolutely necessary and only with the approval of your health care provider.  Do not use hot tubs, steam rooms, or saunas.  Do not drink alcohol.  Do not use any products that contain nicotine or tobacco, such as cigarettes and e-cigarettes. If you need help quitting, ask your health care provider.  Do not use any medicinal herbs or unprescribed drugs. These chemicals affect the formation and growth of the baby.  Do not douche or use tampons or scented sanitary pads.  Do not cross your legs for long periods of time.  To prepare for the arrival of your baby: ? Take prenatal classes to understand, practice, and ask questions about labor and delivery. ? Make a trial run to the hospital. ? Visit the hospital and tour the maternity area. ? Arrange for maternity or paternity leave through employers. ? Arrange for family and friends to take care of pets while you are in the hospital. ? Purchase a rear-facing car seat and make sure you know how to install it in your car. ? Pack your hospital bag. ? Prepare the baby's nursery. Make sure to remove all pillows and stuffed animals from the baby's crib to prevent suffocation.  Visit your dentist if you have not gone during your pregnancy. Use a soft toothbrush to brush your teeth and be gentle when you floss. Contact a health care provider if:  You are unsure if you are in labor or if your water has broken.  You become dizzy.  You have mild pelvic cramps, pelvic pressure, or nagging pain in your abdominal area.  You have lower back pain.  You have persistent  nausea, vomiting, or diarrhea.  You have an unusual or bad smelling vaginal discharge.  You have pain when you urinate. Get help right away if:  Your water breaks before 37 weeks.  You have regular contractions less than 5 minutes apart before 37 weeks.  You have a fever.  You are leaking fluid from your vagina.  You have spotting or bleeding from your vagina.  You have severe abdominal pain or cramping.  You have rapid weight loss or weight gain.    You have shortness of breath with chest pain.  You notice sudden or extreme swelling of your face, hands, ankles, feet, or legs.  Your baby makes fewer than 10 movements in 2 hours.  You have severe headaches that do not go away when you take medicine.  You have vision changes. Summary  The third trimester is from week 28 through week 40, months 7 through 9. The third trimester is a time when the unborn baby (fetus) is growing rapidly.  During the third trimester, your discomfort may increase as you and your baby continue to gain weight. You may have abdominal, leg, and back pain, sleeping problems, and an increased need to urinate.  During the third trimester your breasts will keep growing and they will continue to become tender. A yellow fluid (colostrum) may leak from your breasts. This is the first milk you are producing for your baby.  False labor is a condition in which you feel small, irregular tightenings of the muscles in the womb (contractions) that eventually go away. These are called Braxton Hicks contractions. Contractions may last for hours, days, or even weeks before true labor sets in.  Signs of labor can include: abdominal cramps; regular contractions that start at 10 minutes apart and become stronger and more frequent with time; watery or bloody mucus discharge that comes from the vagina; increased pelvic pressure and dull back pain; and leaking of amniotic fluid. This information is not intended to replace advice  given to you by your health care provider. Make sure you discuss any questions you have with your health care provider. Document Released: 01/05/2001 Document Revised: 06/19/2015 Document Reviewed: 03/14/2012 Elsevier Interactive Patient Education  2017 [employer]  

## 2017-04-23 ENCOUNTER — Other Ambulatory Visit: Payer: Self-pay

## 2017-04-23 ENCOUNTER — Observation Stay
Admission: EM | Admit: 2017-04-23 | Discharge: 2017-04-23 | Disposition: A | Discharge status: Home / Self Care | Payer: 59 | Attending: Obstetrics and Gynecology | Admitting: Obstetrics and Gynecology

## 2017-04-23 DIAGNOSIS — O0991 Supervision of high risk pregnancy, unspecified, first trimester: Secondary | ICD-10-CM

## 2017-04-23 DIAGNOSIS — Z91013 Allergy to seafood: Secondary | ICD-10-CM | POA: Insufficient documentation

## 2017-04-23 DIAGNOSIS — Z7984 Long term (current) use of oral hypoglycemic drugs: Secondary | ICD-10-CM | POA: Insufficient documentation

## 2017-04-23 DIAGNOSIS — R109 Unspecified abdominal pain: Secondary | ICD-10-CM | POA: Diagnosis not present

## 2017-04-23 DIAGNOSIS — E119 Type 2 diabetes mellitus without complications: Secondary | ICD-10-CM

## 2017-04-23 DIAGNOSIS — Z91012 Allergy to eggs: Secondary | ICD-10-CM | POA: Diagnosis not present

## 2017-04-23 DIAGNOSIS — Z3A30 30 weeks gestation of pregnancy: Secondary | ICD-10-CM | POA: Insufficient documentation

## 2017-04-23 DIAGNOSIS — Z91018 Allergy to other foods: Secondary | ICD-10-CM | POA: Diagnosis not present

## 2017-04-23 DIAGNOSIS — O99213 Obesity complicating pregnancy, third trimester: Secondary | ICD-10-CM | POA: Diagnosis not present

## 2017-04-23 DIAGNOSIS — O24313 Unspecified pre-existing diabetes mellitus in pregnancy, third trimester: Secondary | ICD-10-CM

## 2017-04-23 DIAGNOSIS — O24913 Unspecified diabetes mellitus in pregnancy, third trimester: Secondary | ICD-10-CM | POA: Diagnosis not present

## 2017-04-23 DIAGNOSIS — O26893 Other specified pregnancy related conditions, third trimester: Secondary | ICD-10-CM | POA: Diagnosis not present

## 2017-04-23 DIAGNOSIS — Z91010 Allergy to peanuts: Secondary | ICD-10-CM | POA: Diagnosis not present

## 2017-04-23 LAB — FETAL FIBRONECTIN: Fetal Fibronectin: NEGATIVE

## 2017-04-23 NOTE — OB Triage Note (Signed)
Pt arrived to unit with complaints of contraction (7/10 Pain) starting at around 3pm today 3/30 and occurring every 20-3420min. Pt denies spotting, leaking of fluid, or other  Complications. +FM

## 2017-04-23 NOTE — Discharge Instructions (Signed)
Braxton Hicks Contractions °Contractions of the uterus can occur throughout pregnancy, but they are not always a sign that you are in labor. You may have practice contractions called Braxton Hicks contractions. These false labor contractions are sometimes confused with true labor. °What are Braxton Hicks contractions? °Braxton Hicks contractions are tightening movements that occur in the muscles of the uterus before labor. Unlike true labor contractions, these contractions do not result in opening (dilation) and thinning of the cervix. Toward the end of pregnancy (32-34 weeks), Braxton Hicks contractions can happen more often and may become stronger. These contractions are sometimes difficult to tell apart from true labor because they can be very uncomfortable. You should not feel embarrassed if you go to the hospital with false labor. °Sometimes, the only way to tell if you are in true labor is for your health care provider to look for changes in the cervix. The health care provider will do a physical exam and may monitor your contractions. If you are not in true labor, the exam should show that your cervix is not dilating and your water has not broken. °If there are other health problems associated with your pregnancy, it is completely safe for you to be sent home with false labor. You may continue to have Braxton Hicks contractions until you go into true labor. °How to tell the difference between true labor and false labor °True labor °· Contractions last 30-70 seconds. °· Contractions become very regular. °· Discomfort is usually felt in the top of the uterus, and it spreads to the lower abdomen and low back. °· Contractions do not go away with walking. °· Contractions usually become more intense and increase in frequency. °· The cervix dilates and gets thinner. °False labor °· Contractions are usually shorter and not as strong as true labor contractions. °· Contractions are usually irregular. °· Contractions  are often felt in the front of the lower abdomen and in the groin. °· Contractions may go away when you walk around or change positions while lying down. °· Contractions get weaker and are shorter-lasting as time goes on. °· The cervix usually does not dilate or become thin. °Follow these instructions at home: °· Take over-the-counter and prescription medicines only as told by your health care provider. °· Keep up with your usual exercises and follow other instructions from your health care provider. °· Eat and drink lightly if you think you are going into labor. °· If Braxton Hicks contractions are making you uncomfortable: °? Change your position from lying down or resting to walking, or change from walking to resting. °? Sit and rest in a tub of warm water. °? Drink enough fluid to keep your urine pale yellow. Dehydration may cause these contractions. °? Do slow and deep breathing several times an hour. °· Keep all follow-up prenatal visits as told by your health care provider. This is important. °Contact a health care provider if: °· You have a fever. °· You have continuous pain in your abdomen. °Get help right away if: °· Your contractions become stronger, more regular, and closer together. °· You have fluid leaking or gushing from your vagina. °· You pass blood-tinged mucus (bloody show). °· You have bleeding from your vagina. °· You have low back pain that you never had before. °· You feel your baby’s head pushing down and causing pelvic pressure. °· Your baby is not moving inside you as much as it used to. °Summary °· Contractions that occur before labor are called Braxton   Hicks contractions, false labor, or practice contractions. °· Braxton Hicks contractions are usually shorter, weaker, farther apart, and less regular than true labor contractions. True labor contractions usually become progressively stronger and regular and they become more frequent. °· Manage discomfort from Braxton Hicks contractions by  changing position, resting in a warm bath, drinking plenty of water, or practicing deep breathing. °This information is not intended to replace advice given to you by your health care provider. Make sure you discuss any questions you have with your health care provider. °Document Released: 05/27/2016 Document Revised: 05/27/2016 Document Reviewed: 05/27/2016 °Elsevier Interactive Patient Education © 2018 [employer] °LABOR: When contractions begin, you should start to time them from the beginning of one contraction to the beginning of the next.  When contractions are 5-10 minutes apart or less and have been regular for at least an hour, you should call your health care provider. ° °Notify your doctor if any of the following occur: °1. Bleeding from the vagina 7. Sudden, constant, or occasional abdominal pain  °2. Pain or burning when urinating 8. Sudden gushing of fluid from the vagina (with or without continued leaking)  °3. Chills or fever 9. Fainting spells, "black outs" or loss of consciousness  °4. Increase in vaginal discharge 10. Severe or continued nausea or vomiting  °5. Pelvic pressure (sudden increase) 11. Blurring of vision or spots before the eyes  °6. Baby moving less than usual 12. Leaking of fluid  ° ° °FETAL KICK COUNT: °Lie on your left side for one hour after a meal, and count the number of times your baby kicks. If it is less than 5 times, get up, move around and drink some juice. Repeat the test 30 minutes later. If it is still less than 5 kicks in an hour, notify your doctor. °

## 2017-04-23 NOTE — Discharge Summary (Signed)
Physician Final Progress Note  Patient ID: Cheryl HoyerBrittany L Huhta MRN: [id number] DOB/AGE: 1990-02-09 27 y.o.  Admit date: 04/23/2017 Admitting provider: Natale Milchhristanna R Schuman, MD Discharge date: 04/23/2017   Admission Diagnoses: abdominal cramping  Discharge Diagnoses:  Active Problems:   Indication for care in labor and delivery, antepartum 27 yo G2 P1 at 30 weeks 0 days with reactive NST, not in labor, infrequent abdominal cramps  History of Present Illness: The patient is a 27 y.o. female G2P1001 at 5971w0d who presents for cramps that began this afternoon and have been occurring every 20 to 25 minutes. She denies dehydration. She denies leakage of fluid or vaginal bleeding. She admits fetal movement. The cramps began while she was at work.    Past Medical History:  Diagnosis Date  . Obesity (BMI 30-39.9)   . Previous cesarean section   . Type II diabetes mellitus (HCC)     Past Surgical History:  Procedure Laterality Date  . CESAREAN SECTION  2010   failed VAVD  . FOOT SURGERY    . nexplanon removal  07/2016    No current facility-administered medications on file prior to encounter.    Current Outpatient Medications on File Prior to Encounter  Medication Sig Dispense Refill  . folic acid (FOLVITE) 1 MG tablet Take 1 tablet (1 mg total) daily by mouth. 30 tablet 10  . metFORMIN (GLUCOPHAGE) 500 MG tablet Take by mouth 2 (two) times daily with a meal.    . Prenatal Vit-Fe Fumarate-FA (MULTIVITAMIN-PRENATAL) 27-0.8 MG TABS tablet Take 1 tablet daily at 12 noon by mouth.      Allergies  Allergen Reactions  . Chocolate   . Eggs Or Egg-Derived Products Other (See Comments)  . Peanut-Containing Drug Products   . Shellfish Allergy     Social History   Socioeconomic History  . Marital status: Single    Spouse name: Not on file  . Number of children: Not on file  . Years of education: Not on file  . Highest education level: Not on file  Occupational History  . Not on file   Social Needs  . Financial resource strain: Not on file  . Food insecurity:    Worry: Not on file    Inability: Not on file  . Transportation needs:    Medical: Not on file    Non-medical: Not on file  Tobacco Use  . Smoking status: Never Smoker  . Smokeless tobacco: Never Used  Substance and Sexual Activity  . Alcohol use: No  . Drug use: No  . Sexual activity: Yes  Lifestyle  . Physical activity:    Days per week: Not on file    Minutes per session: Not on file  . Stress: Not on file  Relationships  . Social connections:    Talks on phone: Not on file    Gets together: Not on file    Attends religious service: Not on file    Active member of club or organization: Not on file    Attends meetings of clubs or organizations: Not on file    Relationship status: Not on file  . Intimate partner violence:    Fear of current or ex partner: Not on file    Emotionally abused: Not on file    Physically abused: Not on file    Forced sexual activity: Not on file  Other Topics Concern  . Not on file  Social History Narrative  . Not on file    Physical Exam:  BP 99/70 (BP Location: Left Arm)   Pulse 99   Temp 98.4 F (36.9 C) (Oral)   Resp 18   Ht 5\' 5"  (1.651 m)   Wt 234 lb (106.1 kg)   LMP 08/30/2016 (Approximate)   BMI 38.94 kg/m   Gen: NAD CV: RRR Pulm: CTAB Pelvic: closed/thick/high Toco: irritability noted with 2 contractions during NST Fetal well being: baseline 150 bpm, moderate variability, +accelerations, -decelerations Ext: no evidence of DVT  Consults: None  Significant Findings/ Diagnostic Studies:  Awaiting results of fetal fibronectin  Procedures: NST  Discharge Condition: good  Disposition: Discharge disposition: 01-Home or Self Care       Diet: Regular diet  Discharge Activity: Activity as tolerated  Discharge Instructions    Discharge activity:  No Restrictions   Complete by:  As directed    Discharge diet:  No restrictions    Complete by:  As directed    Fetal Kick Count:  Lie on our left side for one hour after a meal, and count the number of times your baby kicks.  If it is less than 5 times, get up, move around and drink some juice.  Repeat the test 30 minutes later.  If it is still less than 5 kicks in an hour, notify your doctor.   Complete by:  As directed    No sexual activity restrictions   Complete by:  As directed    Notify physician for a general feeling that "something is not right"   Complete by:  As directed    Notify physician for increase or change in vaginal discharge   Complete by:  As directed    Notify physician for intestinal cramps, with or without diarrhea, sometimes described as "gas pain"   Complete by:  As directed    Notify physician for leaking of fluid   Complete by:  As directed    Notify physician for low, dull backache, unrelieved by heat or Tylenol   Complete by:  As directed    Notify physician for menstrual like cramps   Complete by:  As directed    Notify physician for pelvic pressure   Complete by:  As directed    Notify physician for uterine contractions.  These may be painless and feel like the uterus is tightening or the baby is  "balling up"   Complete by:  As directed    Notify physician for vaginal bleeding   Complete by:  As directed    PRETERM LABOR:  Includes any of the follwing symptoms that occur between 20 - [redacted] weeks gestation.  If these symptoms are not stopped, preterm labor can result in preterm delivery, placing your baby at risk   Complete by:  As directed      Allergies as of 04/23/2017      Reactions   Chocolate    Eggs Or Egg-derived Products Other (See Comments)   Peanut-containing Drug Products    Shellfish Allergy       Medication List    TAKE these medications   folic acid 1 MG tablet Commonly known as:  FOLVITE Take 1 tablet (1 mg total) daily by mouth.   metFORMIN 500 MG tablet Commonly known as:  GLUCOPHAGE Take by mouth 2 (two)  times daily with a meal.   multivitamin-prenatal 27-0.8 MG Tabs tablet Take 1 tablet daily at 12 noon by mouth.      Follow-up Information    [facility]. Go to.   Specialty:  Obstetrics and Gynecology Why:  regular scheduled prenatal appointment Contact information: [address] [place] [address] [phone]          Total time spent taking care of this patient: 15 minutes  Signed: Tresea Mall, CNM  04/23/2017, 8:15 PM

## 2017-05-04 ENCOUNTER — Ambulatory Visit (INDEPENDENT_AMBULATORY_CARE_PROVIDER_SITE_OTHER): Payer: 59 | Admitting: Obstetrics & Gynecology

## 2017-05-04 ENCOUNTER — Ambulatory Visit (INDEPENDENT_AMBULATORY_CARE_PROVIDER_SITE_OTHER): Payer: 59

## 2017-05-04 VITALS — BP 120/80 | Wt 230.0 lb

## 2017-05-04 DIAGNOSIS — O0993 Supervision of high risk pregnancy, unspecified, third trimester: Secondary | ICD-10-CM

## 2017-05-04 DIAGNOSIS — O24313 Unspecified pre-existing diabetes mellitus in pregnancy, third trimester: Secondary | ICD-10-CM

## 2017-05-04 DIAGNOSIS — O99213 Obesity complicating pregnancy, third trimester: Secondary | ICD-10-CM

## 2017-05-04 DIAGNOSIS — O34219 Maternal care for unspecified type scar from previous cesarean delivery: Secondary | ICD-10-CM

## 2017-05-04 DIAGNOSIS — Z3A31 31 weeks gestation of pregnancy: Secondary | ICD-10-CM

## 2017-05-04 NOTE — Progress Notes (Signed)
Prenatal Visit Note Date: 05/04/2017 Clinic: [facility]  Subjective:  Cheryl Carpenter is a 27 y.o. G2P1001 at 5748w4d being seen today for ongoing prenatal care.  She is currently monitored for the following issues for this high-risk pregnancy and has Diabetes mellitus, type 2 (HCC); Supervision of high risk pregnancy, antepartum, first trimester; Obesity (BMI 30.0-34.9); Pre-existing diabetes mellitus in pregnancy; Previous cesarean delivery affecting pregnancy, antepartum; and Indication for care in labor and delivery, antepartum on their problem list.  Patient reports no complaints.   Contractions: Irregular. Vag. Bleeding: None.  Movement: Present. Denies leaking of fluid.   The following portions of the patient's history were reviewed and updated as appropriate: allergies, current medications, past family history, past medical history, past social history, past surgical history and problem list. Problem list updated.  Objective:   Vitals:   05/04/17 1058  BP: 120/80  Weight: 230 lb (104.3 kg)    Fetal Status:     Movement: Present     General:  Alert, oriented and cooperative. Patient is in no acute distress.  Skin: Skin is warm and dry. No rash noted.   Cardiovascular: Normal heart rate noted  Respiratory: Normal respiratory effort, no problems with respiration noted  Abdomen: Soft, gravid, appropriate for gestational age. Pain/Pressure: Absent     Pelvic:  Cervical exam deferred        Extremities: Normal range of motion.     Mental Status: Normal mood and affect. Normal behavior. Normal judgment and thought content.   Urinalysis: Urine Protein: Negative Urine Glucose: Negative  Assessment and Plan:  Pregnancy: G2P1001 at 2648w4d  1. Pre-existing diabetes mellitus during pregnancy in third trimester - US OB Limited WEEKLY FOR AFI and NST TWICE WEEKLY - Growth US monthly - BS in normal range - Metformin BID  2. Obesity affecting pregnancy in third trimester - US OB Limited;  Future - Baby ASA daily  3. Supervision of high risk pregnancy in third trimester  4. Previous cesarean delivery affecting pregnancy, antepartum CS and BTL June 6, unless needed sooner  5. [redacted] weeks gestation of pregnancy  Preterm labor symptoms and general obstetric precautions including but not limited to vaginal bleeding, contractions, leaking of fluid and fetal movement were reviewed in detail with the patient. Please refer to After Visit Summary for other counseling recommendations.  Return in about 1 week (around 05/11/2017) for HROB & NST every Mon, HROB & NST and US AFI every Thurs for next 5 weeks.  Annamarie MajorPaul Stalin Gruenberg, MD, Merlinda FrederickFACOG [facility], [facility] 05/04/2017  11:32 AM

## 2017-05-09 ENCOUNTER — Ambulatory Visit (INDEPENDENT_AMBULATORY_CARE_PROVIDER_SITE_OTHER): Payer: 59 | Admitting: Obstetrics and Gynecology

## 2017-05-09 ENCOUNTER — Encounter: Payer: Self-pay | Admitting: Obstetrics and Gynecology

## 2017-05-09 VITALS — BP 124/74 | Wt 232.0 lb

## 2017-05-09 DIAGNOSIS — O0991 Supervision of high risk pregnancy, unspecified, first trimester: Secondary | ICD-10-CM

## 2017-05-09 DIAGNOSIS — Z3A32 32 weeks gestation of pregnancy: Secondary | ICD-10-CM

## 2017-05-09 DIAGNOSIS — E669 Obesity, unspecified: Secondary | ICD-10-CM

## 2017-05-09 DIAGNOSIS — O24313 Unspecified pre-existing diabetes mellitus in pregnancy, third trimester: Secondary | ICD-10-CM | POA: Diagnosis not present

## 2017-05-09 DIAGNOSIS — O34219 Maternal care for unspecified type scar from previous cesarean delivery: Secondary | ICD-10-CM

## 2017-05-09 LAB — FETAL NONSTRESS TEST

## 2017-05-09 NOTE — Progress Notes (Signed)
Routine Prenatal Care Visit  Subjective  Cheryl Carpenter is a 27 y.o. G2P1001 at 3836w2d being seen today for ongoing prenatal care.  She is currently monitored for the following issues for this high-risk pregnancy and has ACUT SUPPRATV OTITIS MEDIA W/O SPONT RUP EARDRUM; ALLERGIC RHINITIS; PEANUT ALLERGY; EGG ALLERGY; SHELLFISH ALLERGY; Diabetes mellitus, type 2 (HCC); Supervision of high risk pregnancy, antepartum, first trimester; Obesity (BMI 30.0-34.9); Pre-existing diabetes mellitus in pregnancy; Previous cesarean delivery affecting pregnancy, antepartum; and Indication for care in labor and delivery, antepartum on their problem list.  ----------------------------------------------------------------------------------- Patient reports no complaints.   Contractions: Not present. Vag. Bleeding: None.  Movement: Present. Denies leaking of fluid.  T2DM (on metformin):  Did not bring BG log. States all values < 85 mg/dL.  ----------------------------------------------------------------------------------- The following portions of the patient's history were reviewed and updated as appropriate: allergies, current medications, past family history, past medical history, past social history, past surgical history and problem list. Problem list updated.  Objective  Blood pressure 124/74, weight 232 lb (105.2 kg), last menstrual period 08/30/2016. Pregravid weight 210 lb (95.3 kg) Total Weight Gain 22 lb (9.979 kg) Urinalysis:      Fetal Status: Fetal Heart Rate (bpm): 145   Movement: Present     General:  Alert, oriented and cooperative. Patient is in no acute distress.  Skin: Skin is warm and dry. No rash noted.   Cardiovascular: Normal heart rate noted  Respiratory: Normal respiratory effort, no problems with respiration noted  Abdomen: Soft, gravid, appropriate for gestational age. Pain/Pressure: Absent     Pelvic:  Cervical exam deferred        Extremities: Normal range of motion.     Mental  Status: Normal mood and affect. Normal behavior. Normal judgment and thought content.   NST Baseline FHR: 145 beats/min Variability: moderate Accelerations: present Decelerations: absent Tocometry: not done  Interpretation:  INDICATIONS: diabetes mellitus RESULTS:  A NST procedure was performed with FHR monitoring and a normal baseline established, appropriate time of 20-40 minutes of evaluation, and accels >2 seen w 15x15 characteristics.  Results show a REACTIVE NST.    Assessment   27 y.o. G2P1001 at 3936w2d by  07/02/2017, by Ultrasound presenting for routine prenatal visit  Plan   SECOND Problems (from 11/10/16 to present)    Problem Noted Resolved   Pre-existing diabetes mellitus in pregnancy 12/13/2016 by Farrel ConnersGutierrez, Colleen, CNM No   Overview Addendum 05/09/2017  8:58 AM by Conard NovakJackson, Ji Feldner D, MD    Current Diabetic Medications:  Metformin  Arly.Keller[X ] Aspirin 81 mg daily after 12 weeks; discontinue after 36 weeks (? A2/B GDM) - metformin BID  Required Referrals for A1GDM or A2GDM: [ ]  Diabetes Education and Testing Supplies [ ]  Nutrition Cousult  For A2/B GDM or higher classes of DM [ ]  Diabetes Education and Testing Supplies [ ]  Nutrition Counsult [x]  Fetal ECHO after 22-24 weeks  [ ]  Eye exam for retina evaluation  [ ]  Baseline EKG [ ]  US fetal growth every 4 weeks starting at 28 weeks (31 weeks 55th%ile) [ ]  Twice weekly NST starting at [redacted] weeks gestation [ ]  Delivery planning contingent on fetal growth, AFI, glycemic control, and other co-morbidities but at least by 39 weeks  Baseline and surveillance labs [facility] refresh links as needed)  Lab Results  Component Value Date   CREATININE 0.71 03/13/2015   LABPROT 13.7 03/21/2008   No results found for: HGBA1C  Antenatal Testing Class of DM U/S NST/AFI  DELIVERY  Diabetes   A1 - good control - O24.410    A2 - good control - O24.419      A2  - poor control or poor compliance - O24.419, E11.65    (Macrosomia or polyhydramnios) **E11.65 is extra code for poor control**    A2/B - O24.919  and B-C O24.319  Poor control B-C or D-R-F-T - O24.319  or  Type I DM - O24.019  20-38  20-38  20-24-28-32-36   20-24-28-32-35-38//fetal echo  20-24-27-30-33-36-38//fetal echo  40  32//2 x wk  32//2 x wk   32//2 x wk  28//BPP wkly then 32//2 x wk  40  39  PRN   39  PRN         Diabetes mellitus, type 2 (HCC) 11/10/2016 by Oswaldo Conroy, CNM No   Supervision of high risk pregnancy, antepartum, first trimester 11/10/2016 by Oswaldo Conroy, CNM No   Overview Addendum 12/13/2016  9:39 PM by Farrel Conners, CNM    Clinic [facility] Prenatal Labs  Dating 6wk6d ultrasound Blood type:     Genetic Screen 1 Screen:    AFP:     Quad:     NIPS: Antibody:   Anatomic [place]  Rubella:   Varicella:    GTT Early:               Third trimester:  RPR:     Rhogam  HBsAg:     TDaP vaccine                       Flu Shot: HIV:     Baby Food                                GBS:   Contraception  Pap:  CBB     CS/VBAC    Support Person                 Preterm labor symptoms and general obstetric precautions including but not limited to vaginal bleeding, contractions, leaking of fluid and fetal movement were reviewed in detail with the patient. Please refer to After Visit Summary for other counseling recommendations.   Return for Keep previously scheduled prenatal appts. (patient has a large number of previously scheduled appts upcoming)  Thomasene Mohair, MD, [facility], [facility] 05/09/2017 9:07 AM

## 2017-05-09 NOTE — Progress Notes (Incomplete)
Routine Prenatal Care Visit  Subjective  Cheryl Carpenter is a 27 y.o. G2P1001 at 2259w2d being seen today for ongoing prenatal care.  She is currently monitored for the following issues for this {Blank single:19197::"high-risk","low-risk"} pregnancy and has ACUT SUPPRATV OTITIS MEDIA W/O SPONT RUP EARDRUM; ALLERGIC RHINITIS; PEANUT ALLERGY; EGG ALLERGY; SHELLFISH ALLERGY; Diabetes mellitus, type 2 (HCC); Supervision of high risk pregnancy, antepartum, first trimester; Obesity (BMI 30.0-34.9); Pre-existing diabetes mellitus in pregnancy; Previous cesarean delivery affecting pregnancy, antepartum; and Indication for care in labor and delivery, antepartum on their problem list.  ----------------------------------------------------------------------------------- Patient reports {sx:14538}.   Contractions: Not present. Vag. Bleeding: None.  Movement: Present. Denies leaking of fluid.  ----------------------------------------------------------------------------------- The following portions of the patient's history were reviewed and updated as appropriate: allergies, current medications, past family history, past medical history, past social history, past surgical history and problem list. Problem list updated.   Objective  Blood pressure 124/74, weight 232 lb (105.2 kg), last menstrual period 08/30/2016. Pregravid weight 210 lb (95.3 kg) Total Weight Gain 22 lb (9.979 kg) Urinalysis:      Fetal Status: Fetal Heart Rate (bpm): 145   Movement: Present     General:  Alert, oriented and cooperative. Patient is in no acute distress.  Skin: Skin is warm and dry. No rash noted.   Cardiovascular: Normal heart rate noted  Respiratory: Normal respiratory effort, no problems with respiration noted  Abdomen: Soft, gravid, appropriate for gestational age. Pain/Pressure: Absent     Pelvic:  {Blank single:19197::"Cervical exam performed","Cervical exam deferred"}        Extremities: Normal range of motion.       Mental Status: Normal mood and affect. Normal behavior. Normal judgment and thought content.   Assessment   27 y.o. G2P1001 at 6159w2d by  07/02/2017, by Ultrasound presenting for {Blank single:19197::"routine","work-in"} prenatal visit  Plan   SECOND Problems (from 11/10/16 to present)    Problem Noted Resolved   Pre-existing diabetes mellitus in pregnancy 12/13/2016 by Farrel ConnersGutierrez, Colleen, CNM No   Overview Addendum 05/09/2017  8:58 AM by Conard NovakJackson, Kian Gamarra D, MD    Current Diabetic Medications:  Metformin  Arly.Keller[X ] Aspirin 81 mg daily after 12 weeks; discontinue after 36 weeks (? A2/B GDM) - metformin BID  Required Referrals for A1GDM or A2GDM: [ ]  Diabetes Education and Testing Supplies [ ]  Nutrition Cousult  For A2/B GDM or higher classes of DM [ ]  Diabetes Education and Testing Supplies [ ]  Nutrition Counsult [x]  Fetal ECHO after 22-24 weeks  [ ]  Eye exam for retina evaluation  [ ]  Baseline EKG [ ]  US fetal growth every 4 weeks starting at 28 weeks (31 weeks 55th%ile) [ ]  Twice weekly NST starting at [redacted] weeks gestation [ ]  Delivery planning contingent on fetal growth, AFI, glycemic control, and other co-morbidities but at least by 39 weeks  Baseline and surveillance labs [facility] refresh links as needed)  Lab Results  Component Value Date   CREATININE 0.71 03/13/2015   LABPROT 13.7 03/21/2008   No results found for: HGBA1C  Antenatal Testing Class of DM U/S NST/AFI DELIVERY  Diabetes   A1 - good control - O24.410    A2 - good control - O24.419      A2  - poor control or poor compliance - O24.419, E11.65   (Macrosomia or polyhydramnios) **E11.65 is extra code for poor control**    A2/B - O24.919  and B-C O24.319  Poor control B-C or D-R-F-T - O24.319  or  Type I DM - O24.019  20-38  20-38  20-24-28-32-36   20-24-28-32-35-38//fetal echo  20-24-27-30-33-36-38//fetal echo  40  32//2 x wk  32//2 x wk   32//2 x wk  28//BPP wkly then 32//2 x wk   40  39  PRN   39  PRN         Diabetes mellitus, type 2 (HCC) 11/10/2016 by Oswaldo Conroy, CNM No   Supervision of high risk pregnancy, antepartum, first trimester 11/10/2016 by Oswaldo Conroy, CNM No   Overview Addendum 12/13/2016  9:39 PM by Farrel Conners, CNM    Clinic [facility] Prenatal Labs  Dating 6wk6d ultrasound Blood type:     Genetic Screen 1 Screen:    AFP:     Quad:     NIPS: Antibody:   Anatomic [place]  Rubella:   Varicella:    GTT Early:               Third trimester:  RPR:     Rhogam  HBsAg:     TDaP vaccine                       Flu Shot: HIV:     Baby Food                                GBS:   Contraception  Pap:  CBB     CS/VBAC    Support Person                  {Blank single:19197::"Term","Preterm"} labor symptoms and general obstetric precautions including but not limited to vaginal bleeding, contractions, leaking of fluid and fetal movement were reviewed in detail with the patient. Please refer to After Visit Summary for other counseling recommendations.   Return for Keep previously scheduled prenatal appts.  Thomasene Mohair, MD, [facility], [facility] 05/09/2017 9:07 AM

## 2017-05-12 ENCOUNTER — Encounter: Payer: Self-pay | Admitting: Advanced Practice Midwife

## 2017-05-12 ENCOUNTER — Ambulatory Visit (INDEPENDENT_AMBULATORY_CARE_PROVIDER_SITE_OTHER): Payer: 59

## 2017-05-12 ENCOUNTER — Ambulatory Visit (INDEPENDENT_AMBULATORY_CARE_PROVIDER_SITE_OTHER): Payer: 59 | Admitting: Advanced Practice Midwife

## 2017-05-12 VITALS — BP 110/64 | Wt 232.0 lb

## 2017-05-12 DIAGNOSIS — O99213 Obesity complicating pregnancy, third trimester: Secondary | ICD-10-CM

## 2017-05-12 DIAGNOSIS — O0991 Supervision of high risk pregnancy, unspecified, first trimester: Secondary | ICD-10-CM

## 2017-05-12 DIAGNOSIS — O0993 Supervision of high risk pregnancy, unspecified, third trimester: Secondary | ICD-10-CM | POA: Diagnosis not present

## 2017-05-12 DIAGNOSIS — O24313 Unspecified pre-existing diabetes mellitus in pregnancy, third trimester: Secondary | ICD-10-CM | POA: Diagnosis not present

## 2017-05-12 DIAGNOSIS — Z3A29 29 weeks gestation of pregnancy: Secondary | ICD-10-CM | POA: Diagnosis not present

## 2017-05-12 DIAGNOSIS — O34219 Maternal care for unspecified type scar from previous cesarean delivery: Secondary | ICD-10-CM

## 2017-05-12 DIAGNOSIS — Z3A32 32 weeks gestation of pregnancy: Secondary | ICD-10-CM

## 2017-05-12 NOTE — Progress Notes (Signed)
ROB NST/U/S 

## 2017-05-12 NOTE — Progress Notes (Signed)
Routine Prenatal Care Visit  Subjective  Cheryl Carpenter is a 27 y.o. G2P1001 at [redacted]w[redacted]d being seen today for ongoing prenatal care.  She is currently monitored for the following issues for this high-risk pregnancy and has ACUT SUPPRATV OTITIS MEDIA W/O SPONT RUP EARDRUM; ALLERGIC RHINITIS; PEANUT ALLERGY; EGG ALLERGY; SHELLFISH ALLERGY; Diabetes mellitus, type 2 (HCC); Supervision of high risk pregnancy, antepartum, first trimester; Obesity (BMI 30.0-34.9); Pre-existing diabetes mellitus in pregnancy; Previous cesarean delivery affecting pregnancy, antepartum; and Indication for care in labor and delivery, antepartum on their problem list.  ----------------------------------------------------------------------------------- Patient reports no complaints.  The patient admits she does not keep a blood sugar log, but she checks her sugar 3x per day. She reports fasting levels usually 60s and after meals generally <90. She is encouraged to keep a log and to bring it to visits.  Contractions: Not present. Vag. Bleeding: None.  Movement: Present. Denies leaking of fluid.  ----------------------------------------------------------------------------------- The following portions of the patient's history were reviewed and updated as appropriate: allergies, current medications, past family history, past medical history, past social history, past surgical history and problem list. Problem list updated.   Objective  Blood pressure 110/64, weight 232 lb (105.2 kg), last menstrual period 08/30/2016. Pregravid weight 210 lb (95.3 kg) Total Weight Gain 22 lb (9.979 kg) Urinalysis: Urine Protein: Negative Urine Glucose: Negative  Fetal Status: Fetal Heart Rate (bpm): 145   Movement: Present  Presentation: Vertex  AFI today: 17.8, stomach, kidney and bladder appear normal NST today of 20 minute length is reactive. Baseline 145 bpm, moderate variability, +accelerations, -decelerations   General:  Alert, oriented  and cooperative. Patient is in no acute distress.  Skin: Skin is warm and dry. No rash noted.   Cardiovascular: Normal heart rate noted  Respiratory: Normal respiratory effort, no problems with respiration noted  Abdomen: Soft, gravid, appropriate for gestational age. Pain/Pressure: Absent     Pelvic:  Cervical exam deferred        Extremities: Normal range of motion.     Mental Status: Normal mood and affect. Normal behavior. Normal judgment and thought content.   Assessment   27 y.o. G2P1001 at [redacted]w[redacted]d by  07/02/2017, by Ultrasound presenting for routine prenatal visit  Plan   SECOND Problems (from 11/10/16 to present)    Problem Noted Resolved   Pre-existing diabetes mellitus in pregnancy 12/13/2016 by Farrel Conners, CNM No   Overview Addendum 05/09/2017  8:58 AM by Conard Novak, MD    Current Diabetic Medications:  Metformin  Arly.Keller ] Aspirin 81 mg daily after 12 weeks; discontinue after 36 weeks (? A2/B GDM) - metformin BID  Required Referrals for A1GDM or A2GDM: [ ]  Diabetes Education and Testing Supplies [ ]  Nutrition Cousult  For A2/B GDM or higher classes of DM [ ]  Diabetes Education and Testing Supplies [ ]  Nutrition Counsult [x]  Fetal ECHO after 22-24 weeks  [ ]  Eye exam for retina evaluation  [ ]  Baseline EKG [ ]  US fetal growth every 4 weeks starting at 28 weeks (31 weeks 55th%ile) [ ]  Twice weekly NST starting at [redacted] weeks gestation [ ]  Delivery planning contingent on fetal growth, AFI, glycemic control, and other co-morbidities but at least by 39 weeks  Baseline and surveillance labs (pulled in from [facility], refresh links as needed)  Lab Results  Component Value Date   CREATININE 0.71 03/13/2015   LABPROT 13.7 03/21/2008   No results found for: HGBA1C  Antenatal Testing Class of DM U/S NST/AFI DELIVERY  Diabetes   A1 - good control - O24.410    A2 - good control - O24.419      A2  - poor control or poor compliance - O24.419, E11.65   (Macrosomia or  polyhydramnios) **E11.65 is extra code for poor control**    A2/B - O24.919  and B-C O24.319  Poor control B-C or D-R-F-T - O24.319  or  Type I DM - O24.019  20-38  20-38  20-24-28-32-36   20-24-28-32-35-38//fetal echo  20-24-27-30-33-36-38//fetal echo  40  32//2 x wk  32//2 x wk   32//2 x wk  28//BPP wkly then 32//2 x wk  40  39  PRN   39  PRN         Diabetes mellitus, type 2 (HCC) 11/10/2016 by Oswaldo ConroySchmid, Jacelyn Y, CNM No   Supervision of high risk pregnancy, antepartum, first trimester 11/10/2016 by Oswaldo ConroySchmid, Jacelyn Y, CNM No   Overview Addendum 12/13/2016  9:39 PM by Farrel ConnersGutierrez, Colleen, CNM    Clinic [facility] Prenatal Labs  Dating 6wk6d ultrasound Blood type:     Genetic Screen 1 Screen:    AFP:     Quad:     NIPS: Antibody:   Anatomic US  Rubella:   Varicella:    GTT Early:               Third trimester:  RPR:     Rhogam  HBsAg:     TDaP vaccine                       Flu Shot: HIV:     Baby Food                                GBS:   Contraception  Pap:  CBB     CS/VBAC    Support Person                  Preterm labor symptoms and general obstetric precautions including but not limited to vaginal bleeding, contractions, leaking of fluid and fetal movement were reviewed in detail with the patient.   Return for has f/u appts scheduled.  Tresea MallJane Shelagh Rayman, CNM 05/12/2017 2:13 PM

## 2017-05-16 ENCOUNTER — Ambulatory Visit (INDEPENDENT_AMBULATORY_CARE_PROVIDER_SITE_OTHER): Payer: 59 | Admitting: Obstetrics and Gynecology

## 2017-05-16 VITALS — BP 120/76 | Wt 238.0 lb

## 2017-05-16 DIAGNOSIS — E669 Obesity, unspecified: Secondary | ICD-10-CM

## 2017-05-16 DIAGNOSIS — Z3A33 33 weeks gestation of pregnancy: Secondary | ICD-10-CM

## 2017-05-16 DIAGNOSIS — O24313 Unspecified pre-existing diabetes mellitus in pregnancy, third trimester: Secondary | ICD-10-CM | POA: Diagnosis not present

## 2017-05-16 DIAGNOSIS — E119 Type 2 diabetes mellitus without complications: Secondary | ICD-10-CM

## 2017-05-16 DIAGNOSIS — O0991 Supervision of high risk pregnancy, unspecified, first trimester: Secondary | ICD-10-CM

## 2017-05-16 NOTE — Progress Notes (Signed)
HROB NST 

## 2017-05-18 ENCOUNTER — Other Ambulatory Visit: Payer: Self-pay | Admitting: Obstetrics and Gynecology

## 2017-05-18 DIAGNOSIS — E669 Obesity, unspecified: Principal | ICD-10-CM

## 2017-05-18 DIAGNOSIS — E1169 Type 2 diabetes mellitus with other specified complication: Secondary | ICD-10-CM

## 2017-05-19 ENCOUNTER — Encounter: Payer: Self-pay | Admitting: Maternal Newborn

## 2017-05-19 ENCOUNTER — Ambulatory Visit (INDEPENDENT_AMBULATORY_CARE_PROVIDER_SITE_OTHER): Payer: 59

## 2017-05-19 ENCOUNTER — Ambulatory Visit (INDEPENDENT_AMBULATORY_CARE_PROVIDER_SITE_OTHER): Payer: 59 | Admitting: Maternal Newborn

## 2017-05-19 VITALS — BP 120/80 | Wt 228.0 lb

## 2017-05-19 DIAGNOSIS — O0991 Supervision of high risk pregnancy, unspecified, first trimester: Secondary | ICD-10-CM

## 2017-05-19 DIAGNOSIS — E1169 Type 2 diabetes mellitus with other specified complication: Secondary | ICD-10-CM | POA: Diagnosis not present

## 2017-05-19 DIAGNOSIS — E669 Obesity, unspecified: Secondary | ICD-10-CM

## 2017-05-19 DIAGNOSIS — O24313 Unspecified pre-existing diabetes mellitus in pregnancy, third trimester: Secondary | ICD-10-CM | POA: Diagnosis not present

## 2017-05-19 DIAGNOSIS — Z3A33 33 weeks gestation of pregnancy: Secondary | ICD-10-CM | POA: Diagnosis not present

## 2017-05-19 LAB — FETAL NONSTRESS TEST

## 2017-05-19 NOTE — Progress Notes (Signed)
No concerns.rj 

## 2017-05-19 NOTE — Progress Notes (Signed)
Routine Prenatal Care Visit  Subjective  Cheryl Carpenter is a 27 y.o. G2P1001 at 3671w5d being seen today for ongoing prenatal care.  She is currently monitored for the following issues for this high-risk pregnancy and has ALLERGIC RHINITIS; PEANUT ALLERGY; EGG ALLERGY; SHELLFISH ALLERGY; Diabetes mellitus, type 2 (HCC); Supervision of high risk pregnancy, antepartum, first trimester; Obesity (BMI 30.0-34.9); Pre-existing diabetes mellitus in pregnancy; Previous cesarean delivery affecting pregnancy, antepartum; and Indication for care in labor and delivery, antepartum on their problem list.  ----------------------------------------------------------------------------------- Patient reports no complaints.   Contractions: Not present. Vag. Bleeding: None.  Movement: Present. Denies leaking of fluid.  ----------------------------------------------------------------------------------- The following portions of the patient's history were reviewed and updated as appropriate: allergies, current medications, past family history, past medical history, past social history, past surgical history and problem list. Problem list updated.   Objective  Last menstrual period 08/30/2016. Pregravid weight 210 lb (95.3 kg) Total Weight Gain 18 lb (8.165 kg) Urinalysis: Urine Protein: Trace Urine Glucose: Negative  Fetal Status: Fetal Heart Rate (bpm): 148 Fundal Height: 36 cm Movement: Present     General:  Alert, oriented and cooperative. Patient is in no acute distress.  Skin: Skin is warm and dry. No rash noted.   Cardiovascular: Normal heart rate noted  Respiratory: Normal respiratory effort, no problems with respiration noted  Abdomen: Soft, gravid, appropriate for gestational age. Pain/Pressure: Absent     Pelvic:  Cervical exam deferred        Extremities: Normal range of motion.  Edema: Trace  Mental Status: Normal mood and affect. Normal behavior. Normal judgment and thought content.    NST Baseline: 155 Variability: moderate Accelerations: present Decelerations: absent Tocometry: none The patient was monitored for 20+ minutes, fetal heart rate tracing was deemed reactive, category I tracing.  Assessment   27 y.o. G2P1001 at 3871w5d, EDD 07/02/2017 by Ultrasound presenting for routine prenatal visit.  Plan   SECOND Problems (from 11/10/16 to present)    Problem Noted Resolved   Pre-existing diabetes mellitus in pregnancy 12/13/2016 by Farrel ConnersGutierrez, Colleen, CNM No   Overview Addendum 05/09/2017  8:58 AM by Conard NovakJackson, Stephen D, MD    Current Diabetic Medications:  Metformin  Arly.Keller[X ] Aspirin 81 mg daily after 12 weeks; discontinue after 36 weeks (? A2/B GDM) - metformin BID  Required Referrals for A1GDM or A2GDM: [ ]  Diabetes Education and Testing Supplies [ ]  Nutrition Cousult  For A2/B GDM or higher classes of DM [ ]  Diabetes Education and Testing Supplies [ ]  Nutrition Counsult [x]  Fetal ECHO after 22-24 weeks  [ ]  Eye exam for retina evaluation  [ ]  Baseline EKG [ ]  US fetal growth every 4 weeks starting at 28 weeks (31 weeks 55th%ile) [ ]  Twice weekly NST starting at [redacted] weeks gestation [ ]  Delivery planning contingent on fetal growth, AFI, glycemic control, and other co-morbidities but at least by 39 weeks  Baseline and surveillance labs [facility], refresh links as needed)  Lab Results  Component Value Date   CREATININE 0.71 03/13/2015   LABPROT 13.7 03/21/2008   No results found for: HGBA1C  Antenatal Testing Class of DM U/S NST/AFI DELIVERY  Diabetes   A1 - good control - O24.410    A2 - good control - O24.419      A2  - poor control or poor compliance - O24.419, E11.65   (Macrosomia or polyhydramnios) **E11.65 is extra code for poor control**    A2/B - O24.919  and B-C O24.319  Poor control B-C or D-R-F-T - O24.319  or  Type I DM - O24.019  20-38  20-38  20-24-28-32-36   20-24-28-32-35-38//fetal  echo  20-24-27-30-33-36-38//fetal echo  40  32//2 x wk  32//2 x wk   32//2 x wk  28//BPP wkly then 32//2 x wk  40  39  PRN   39  PRN         Diabetes mellitus, type 2 (HCC) 11/10/2016 by Oswaldo Conroy, CNM No   Supervision of high risk pregnancy, antepartum, first trimester 11/10/2016 by Oswaldo Conroy, CNM No   Overview Addendum 05/16/2017 11:30 AM by Natale Milch, MD    Clinic [facility] Prenatal Labs  Dating 6wk6d ultrasound Blood type: A/Positive/-- (11/19 1016)   Genetic Screen 1 Screen:    AFP:     Quad:     NIPS: Antibody:Negative (11/19 1016)  Anatomic [place]  Rubella: 6.69 (11/19 1016) Varicella:    GTT Early:               Third trimester:  RPR: Non Reactive (11/19 1016)   Rhogam  HBsAg: Negative (11/19 1016)   TDaP vaccine  04/20/17                      Flu Shot: HIV: Non Reactive (11/19 1016)   Baby Food                                GBS:   Contraception  Pap:  CBB     CS/VBAC    Support Person               AFI today is 16.94 cm.  Patient does not have a BG log, reports values in the 80s with no hypoglycemic episodes. Feels that she has good glycemic control with Metformin.  Gestational age appropriate obstetric precautions were reviewed.   Follow up visits scheduled, ROB with NST Mondays and ROB with NST/AFI on Thursdays.  Marcelyn Bruins, CNM 05/20/2017  8:04 AM

## 2017-05-23 ENCOUNTER — Ambulatory Visit (INDEPENDENT_AMBULATORY_CARE_PROVIDER_SITE_OTHER): Payer: 59 | Admitting: Obstetrics & Gynecology

## 2017-05-23 VITALS — BP 110/70 | Wt 229.0 lb

## 2017-05-23 DIAGNOSIS — O24313 Unspecified pre-existing diabetes mellitus in pregnancy, third trimester: Secondary | ICD-10-CM

## 2017-05-23 DIAGNOSIS — Z3A34 34 weeks gestation of pregnancy: Secondary | ICD-10-CM

## 2017-05-23 DIAGNOSIS — O0993 Supervision of high risk pregnancy, unspecified, third trimester: Secondary | ICD-10-CM

## 2017-05-23 DIAGNOSIS — O34219 Maternal care for unspecified type scar from previous cesarean delivery: Secondary | ICD-10-CM

## 2017-05-23 DIAGNOSIS — E669 Obesity, unspecified: Secondary | ICD-10-CM

## 2017-05-23 NOTE — Progress Notes (Signed)
  Subjective  Fetal Movement? yes Contractions? no Leaking Fluid? no Vaginal Bleeding? no BS all reported normal Objective  BP 110/70   Wt 229 lb (103.9 kg)   LMP 08/30/2016 (Approximate)   BMI 38.11 kg/m  General: NAD Pumonary: no increased work of breathing Abdomen: gravid, non-tender Extremities: no edema Psychiatric: mood appropriate, affect full  Assessment  27 y.o. G2P1001 at [redacted]w[redacted]d by  07/02/2017, by Ultrasound presenting for routine prenatal visit  Plan   Problem List Items Addressed This Visit      Endocrine   Pre-existing diabetes mellitus in pregnancy     Other   Supervision of high risk pregnancy, antepartum, first trimester   Obesity (BMI 30.0-34.9)   Previous cesarean delivery affecting pregnancy, antepartum    Other Visit Diagnoses    [redacted] weeks gestation of pregnancy    -  Primary    A NST procedure was performed with FHR monitoring and a normal baseline established, appropriate time of 20-40 minutes of evaluation, and accels >2 seen w 15x15 characteristics.  Results show a REACTIVE NST.   APT twice weekly Metformin and BS monitoring CS planned, w BTL GERD, advised Zantac  Annamarie Major, MD, [facility], [facility] 05/23/2017  8:28 AM

## 2017-05-23 NOTE — Progress Notes (Signed)
Routine Prenatal Care Visit  Subjective  Cheryl Carpenter is a 27 y.o. G2P1001 at [redacted]w[redacted]d being seen today for ongoing prenatal care.  She is currently monitored for the following issues for this high-risk pregnancy and has ALLERGIC RHINITIS; PEANUT ALLERGY; EGG ALLERGY; SHELLFISH ALLERGY; Diabetes mellitus, type 2 (HCC); Supervision of high risk pregnancy, antepartum, first trimester; Obesity (BMI 30.0-34.9); Pre-existing diabetes mellitus in pregnancy; Previous cesarean delivery affecting pregnancy, antepartum; and Indication for care in labor and delivery, antepartum on their problem list.  ----------------------------------------------------------------------------------- Patient reports no complaints .   Contractions: Not present. Vag. Bleeding: None.  Movement: Present. Denies leaking of fluid.  ----------------------------------------------------------------------------------- The following portions of the patient's history were reviewed and updated as appropriate: allergies, current medications, past family history, past medical history, past social history, past surgical history and problem list. Problem list updated.   Objective  Blood pressure 120/76, weight 238 lb (108 kg), last menstrual period 08/30/2016. Pregravid weight 210 lb (95.3 kg) Total Weight Gain 28 lb (12.7 kg) Urinalysis: Urine Protein: Trace    Fetal Status: Fetal Heart Rate (bpm): 140   Movement: Present     General:  Alert, oriented and cooperative. Patient is in no acute distress.  Skin: Skin is warm and dry. No rash noted.   Cardiovascular: Normal heart rate noted  Respiratory: Normal respiratory effort, no problems with respiration noted  Abdomen: Soft, gravid, appropriate for gestational age. Pain/Pressure: Absent     Pelvic:  Cervical exam deferred        Extremities: Normal range of motion.     ental Status: Normal mood and affect. Normal behavior. Normal judgment and thought content.   NST: Reactive:  140s, moderate variability, 2 15x15 accelerations present., no decelerations. Category I tracing.   Assessment   27 y.o. G2P1001 at [redacted]w[redacted]d by  07/02/2017, by Ultrasound presenting for routine prenatal visit  Plan   SECOND Problems (from 11/10/16 to present)    Problem Noted Resolved   Pre-existing diabetes mellitus in pregnancy 12/13/2016 by Farrel Conners, CNM No   Overview Addendum 05/09/2017  8:58 AM by Conard Novak, MD    Current Diabetic Medications:  Metformin  Arly.Keller ] Aspirin 81 mg daily after 12 weeks; discontinue after 36 weeks (? A2/B GDM) - metformin BID  Required Referrals for A1GDM or A2GDM:  Diabetes Education and Testing Supplies  Nutrition Cousult  For A2/B GDM or higher classes of DM  Diabetes Education and Testing Supplies  Nutrition Counsult  Fetal ECHO after 22-24 weeks   Eye exam for retina evaluation   Baseline EKG  US fetal growth every 4 weeks starting at 28 weeks (31 weeks 55th%ile)  Twice weekly NST starting at [redacted] weeks gestation  Delivery planning contingent on fetal growth, AFI, glycemic control, and other co-morbidities but at least by 39 weeks  Baseline and surveillance labs (pulled in from [facility], refresh links as needed)  Lab Results  Component Value Date   CREATININE 0.71 03/13/2015   LABPROT 13.7 03/21/2008   No results found for: HGBA1C  Antenatal Testing Class of DM U/S NST/AFI DELIVERY  Diabetes   A1 - good control - O24.410    A2 - good control - O24.419      A2  - poor control or poor compliance - O24.419, E11.65   (Macrosomia or polyhydramnios) **E11.65 is extra code for poor control**    A2/B - O24.919  and B-C O24.319  Poor control  B-C or D-R-F-T - O24.319  or  Type I DM - O24.019  20-38  20-38  20-24-28-32-36   20-24-28-32-35-38//fetal echo  20-24-27-30-33-36-38//fetal echo  40  32//2 x wk  32//2 x wk   32//2 x wk  28//BPP wkly then 32//2 x wk   40  39  PRN   39  PRN         Diabetes mellitus, type 2 (HCC) 11/10/2016 by Oswaldo Conroy, CNM No   Supervision of high risk pregnancy, antepartum, first trimester 11/10/2016 by Oswaldo Conroy, CNM No   Overview Addendum 05/16/2017 11:30 AM by Natale Milch, MD    [facility]inic [facility] Prenatal Labs  Dating 6wk6d ultrasound Blood type: A/Positive/-- (11/19 1016)   Genetic Screen 1 Screen:    AFP:     Quad:     NIPS: Antibody:Negative (11/19 1016)  Anatomic [place]  Rubella: 6.69 (11/19 1016) Varicella:    GTT Early:               Third trimester:  RPR: Non Reactive (11/19 1016)   Rhogam  HBsAg: Negative (11/19 1016)   TDaP vaccine  04/20/17                      Flu Shot: HIV: Non Reactive (11/19 1016)   Baby Food                                GBS:   Contraception  Pap:  CBB     CS/VBAC    Support Person                  Gestational age appropriate obstetric precautions including but not limited to vaginal bleeding, contractions, leaking of fluid and fetal movement were reviewed in detail with the patient.    Patient is not keeping blood glucose log. She was given a log book and encouraged to bring it with her to future visits  Return in about 3 days (around 05/19/2017) for as planned already for [place] and NST.   Adelene Idler MD [facility], [facility] 05/23/2017, 10:03 PM

## 2017-05-23 NOTE — Patient Instructions (Signed)
Third Trimester of Pregnancy The third trimester is from week 28 through week 40 (months 7 through 9). The third trimester is a time when the unborn baby (fetus) is growing rapidly. At the end of the ninth month, the fetus is about 20 inches in length and weighs 6-10 pounds. Body changes during your third trimester Your body will continue to go through many changes during pregnancy. The changes vary from woman to woman. During the third trimester:  Your weight will continue to increase. You can expect to gain 25-35 pounds (11-16 kg) by the end of the pregnancy.  You may begin to get stretch marks on your hips, abdomen, and breasts.  You may urinate more often because the fetus is moving lower into your pelvis and pressing on your bladder.  You may develop or continue to have heartburn. This is caused by increased hormones that slow down muscles in the digestive tract.  You may develop or continue to have constipation because increased hormones slow digestion and cause the muscles that push waste through your intestines to relax.  You may develop hemorrhoids. These are swollen veins (varicose veins) in the rectum that can itch or be painful.  You may develop swollen, bulging veins (varicose veins) in your legs.  You may have increased body aches in the pelvis, back, or thighs. This is due to weight gain and increased hormones that are relaxing your joints.  You may have changes in your hair. These can include thickening of your hair, rapid growth, and changes in texture. Some women also have hair loss during or after pregnancy, or hair that feels dry or thin. Your hair will most likely return to normal after your baby is born.  Your breasts will continue to grow and they will continue to become tender. A yellow fluid (colostrum) may leak from your breasts. This is the first milk you are producing for your baby.  Your belly button may stick out.  You may notice more swelling in your hands,  face, or ankles.  You may have increased tingling or numbness in your hands, arms, and legs. The skin on your belly may also feel numb.  You may feel short of breath because of your expanding uterus.  You may have more problems sleeping. This can be caused by the size of your belly, increased need to urinate, and an increase in your body's metabolism.  You may notice the fetus "dropping," or moving lower in your abdomen (lightening).  You may have increased vaginal discharge.  You may notice your joints feel loose and you may have pain around your pelvic bone.  What to expect at prenatal visits You will have prenatal exams every 2 weeks until week 36. Then you will have weekly prenatal exams. During a routine prenatal visit:  You will be weighed to make sure you and the baby are growing normally.  Your blood pressure will be taken.  Your abdomen will be measured to track your baby's growth.  The fetal heartbeat will be listened to.  Any test results from the previous visit will be discussed.  You may have a cervical check near your due date to see if your cervix has softened or thinned (effaced).  You will be tested for Group B streptococcus. This happens between 35 and 37 weeks.  Your health care provider may ask you:  What your birth plan is.  How you are feeling.  If you are feeling the baby move.  If you have had   any abnormal symptoms, such as leaking fluid, bleeding, severe headaches, or abdominal cramping.  If you are using any tobacco products, including cigarettes, chewing tobacco, and electronic cigarettes.  If you have any questions.  Other tests or screenings that may be performed during your third trimester include:  Blood tests that check for low iron levels (anemia).  Fetal testing to check the health, activity level, and growth of the fetus. Testing is done if you have certain medical conditions or if there are problems during the  pregnancy.  Nonstress test (NST). This test checks the health of your baby to make sure there are no signs of problems, such as the baby not getting enough oxygen. During this test, a belt is placed around your belly. The baby is made to move, and its heart rate is monitored during movement.  What is false labor? False labor is a condition in which you feel small, irregular tightenings of the muscles in the womb (contractions) that usually go away with rest, changing position, or drinking water. These are called Braxton Hicks contractions. Contractions may last for hours, days, or even weeks before true labor sets in. If contractions come at regular intervals, become more frequent, increase in intensity, or become painful, you should see your health care provider. What are the signs of labor?  Abdominal cramps.  Regular contractions that start at 10 minutes apart and become stronger and more frequent with time.  Contractions that start on the top of the uterus and spread down to the lower abdomen and back.  Increased pelvic pressure and dull back pain.  A watery or bloody mucus discharge that comes from the vagina.  Leaking of amniotic fluid. This is also known as your "water breaking." It could be a slow trickle or a gush. Let your health care provider know if it has a color or strange odor. If you have any of these signs, call your health care provider right away, even if it is before your due date. Follow these instructions at home: Medicines  Follow your health care provider's instructions regarding medicine use. Specific medicines may be either safe or unsafe to take during pregnancy.  Take a prenatal vitamin that contains at least 600 micrograms (mcg) of folic acid.  If you develop constipation, try taking a stool softener if your health care provider approves. Eating and drinking  Eat a balanced diet that includes fresh fruits and vegetables, whole grains, good sources of protein  such as meat, eggs, or tofu, and low-fat dairy. Your health care provider will help you determine the amount of weight gain that is right for you.  Avoid raw meat and uncooked cheese. These carry germs that can cause birth defects in the baby.  If you have low calcium intake from food, talk to your health care provider about whether you should take a daily calcium supplement.  Eat four or five small meals rather than three large meals a day.  Limit foods that are high in fat and processed sugars, such as fried and sweet foods.  To prevent constipation: ? Drink enough fluid to keep your urine clear or pale yellow. ? Eat foods that are high in fiber, such as fresh fruits and vegetables, whole grains, and beans. Activity  Exercise only as directed by your health care provider. Most women can continue their usual exercise routine during pregnancy. Try to exercise for 30 minutes at least 5 days a week. Stop exercising if you experience uterine contractions.  Avoid heavy   lifting.  Do not exercise in extreme heat or humidity, or at high altitudes.  Wear low-heel, comfortable shoes.  Practice good posture.  You may continue to have sex unless your health care provider tells you otherwise. Relieving pain and discomfort  Take frequent breaks and rest with your legs elevated if you have leg cramps or low back pain.  Take warm sitz baths to soothe any pain or discomfort caused by hemorrhoids. Use hemorrhoid cream if your health care provider approves.  Wear a good support bra to prevent discomfort from breast tenderness.  If you develop varicose veins: ? Wear support pantyhose or compression stockings as told by your healthcare provider. ? Elevate your feet for 15 minutes, 3-4 times a day. Prenatal care  Write down your questions. Take them to your prenatal visits.  Keep all your prenatal visits as told by your health care provider. This is important. Safety  Wear your seat belt at  all times when driving.  Make a list of emergency phone numbers, including numbers for family, friends, the hospital, and police and fire departments. General instructions  Avoid cat litter boxes and soil used by cats. These carry germs that can cause birth defects in the baby. If you have a cat, ask someone to clean the litter box for you.  Do not travel far distances unless it is absolutely necessary and only with the approval of your health care provider.  Do not use hot tubs, steam rooms, or saunas.  Do not drink alcohol.  Do not use any products that contain nicotine or tobacco, such as cigarettes and e-cigarettes. If you need help quitting, ask your health care provider.  Do not use any medicinal herbs or unprescribed drugs. These chemicals affect the formation and growth of the baby.  Do not douche or use tampons or scented sanitary pads.  Do not cross your legs for long periods of time.  To prepare for the arrival of your baby: ? Take prenatal classes to understand, practice, and ask questions about labor and delivery. ? Make a trial run to the hospital. ? Visit the hospital and tour the maternity area. ? Arrange for maternity or paternity leave through employers. ? Arrange for family and friends to take care of pets while you are in the hospital. ? Purchase a rear-facing car seat and make sure you know how to install it in your car. ? Pack your hospital bag. ? Prepare the baby's nursery. Make sure to remove all pillows and stuffed animals from the baby's crib to prevent suffocation.  Visit your dentist if you have not gone during your pregnancy. Use a soft toothbrush to brush your teeth and be gentle when you floss. Contact a health care provider if:  You are unsure if you are in labor or if your water has broken.  You become dizzy.  You have mild pelvic cramps, pelvic pressure, or nagging pain in your abdominal area.  You have lower back pain.  You have persistent  nausea, vomiting, or diarrhea.  You have an unusual or bad smelling vaginal discharge.  You have pain when you urinate. Get help right away if:  Your water breaks before 37 weeks.  You have regular contractions less than 5 minutes apart before 37 weeks.  You have a fever.  You are leaking fluid from your vagina.  You have spotting or bleeding from your vagina.  You have severe abdominal pain or cramping.  You have rapid weight loss or weight gain.    You have shortness of breath with chest pain.  You notice sudden or extreme swelling of your face, hands, ankles, feet, or legs.  Your baby makes fewer than 10 movements in 2 hours.  You have severe headaches that do not go away when you take medicine.  You have vision changes. Summary  The third trimester is from week 28 through week 40, months 7 through 9. The third trimester is a time when the unborn baby (fetus) is growing rapidly.  During the third trimester, your discomfort may increase as you and your baby continue to gain weight. You may have abdominal, leg, and back pain, sleeping problems, and an increased need to urinate.  During the third trimester your breasts will keep growing and they will continue to become tender. A yellow fluid (colostrum) may leak from your breasts. This is the first milk you are producing for your baby.  False labor is a condition in which you feel small, irregular tightenings of the muscles in the womb (contractions) that eventually go away. These are called Braxton Hicks contractions. Contractions may last for hours, days, or even weeks before true labor sets in.  Signs of labor can include: abdominal cramps; regular contractions that start at 10 minutes apart and become stronger and more frequent with time; watery or bloody mucus discharge that comes from the vagina; increased pelvic pressure and dull back pain; and leaking of amniotic fluid. This information is not intended to replace advice  given to you by your health care provider. Make sure you discuss any questions you have with your health care provider. Document Released: 01/05/2001 Document Revised: 06/19/2015 Document Reviewed: 03/14/2012 Elsevier Interactive Patient Education  2017 [employer]  

## 2017-05-26 ENCOUNTER — Ambulatory Visit (INDEPENDENT_AMBULATORY_CARE_PROVIDER_SITE_OTHER): Payer: 59 | Admitting: Obstetrics & Gynecology

## 2017-05-26 ENCOUNTER — Ambulatory Visit (INDEPENDENT_AMBULATORY_CARE_PROVIDER_SITE_OTHER): Payer: 59

## 2017-05-26 VITALS — BP 110/70 | Wt 229.0 lb

## 2017-05-26 DIAGNOSIS — O0991 Supervision of high risk pregnancy, unspecified, first trimester: Secondary | ICD-10-CM

## 2017-05-26 DIAGNOSIS — Z3A34 34 weeks gestation of pregnancy: Secondary | ICD-10-CM | POA: Diagnosis not present

## 2017-05-26 DIAGNOSIS — E669 Obesity, unspecified: Secondary | ICD-10-CM

## 2017-05-26 DIAGNOSIS — O34219 Maternal care for unspecified type scar from previous cesarean delivery: Secondary | ICD-10-CM

## 2017-05-26 DIAGNOSIS — O24113 Pre-existing diabetes mellitus, type 2, in pregnancy, third trimester: Secondary | ICD-10-CM | POA: Diagnosis not present

## 2017-05-26 DIAGNOSIS — O24313 Unspecified pre-existing diabetes mellitus in pregnancy, third trimester: Secondary | ICD-10-CM | POA: Diagnosis not present

## 2017-05-26 DIAGNOSIS — E119 Type 2 diabetes mellitus without complications: Secondary | ICD-10-CM | POA: Diagnosis not present

## 2017-05-26 DIAGNOSIS — Z3A32 32 weeks gestation of pregnancy: Secondary | ICD-10-CM | POA: Diagnosis not present

## 2017-05-26 DIAGNOSIS — O99213 Obesity complicating pregnancy, third trimester: Secondary | ICD-10-CM | POA: Diagnosis not present

## 2017-05-26 LAB — FETAL NONSTRESS TEST

## 2017-05-26 NOTE — Progress Notes (Signed)
  Subjective  Fetal Movement? yes Contractions? no Leaking Fluid? no Vaginal Bleeding? no BS log reviewed, mostly normal BS Objective  BP 110/70   Wt 229 lb (103.9 kg)   LMP 08/30/2016 (Approximate)   BMI 38.11 kg/m  General: NAD Pumonary: no increased work of breathing Abdomen: gravid, non-tender Extremities: no edema Psychiatric: mood appropriate, affect full  Assessment  27 y.o. G2P1001 at [redacted]w[redacted]d by  07/02/2017, by Ultrasound presenting for routine prenatal visit  Plan   Problem List Items Addressed This Visit      Endocrine   Diabetes mellitus, type 2 (HCC)     Other   Supervision of high risk pregnancy, antepartum, first trimester   Obesity (BMI 30.0-34.9)   Previous cesarean delivery affecting pregnancy, antepartum    Other Visit Diagnoses    [redacted] weeks gestation of pregnancy    -  Primary    A NST procedure was performed with FHR monitoring and a normal baseline established, appropriate time of 20-40 minutes of evaluation, and accels >2 seen w 15x15 characteristics.  Results show a REACTIVE NST.   Review of ULTRASOUND.    I have personally reviewed images and report of recent ultrasound done at [facility].    Plan of management to be discussed with patient.  Cont Metformin  Annamarie Major, MD, [facility], [facility] 05/26/2017  10:32 AM

## 2017-05-30 ENCOUNTER — Encounter: Payer: Self-pay | Admitting: Obstetrics and Gynecology

## 2017-05-30 ENCOUNTER — Ambulatory Visit (INDEPENDENT_AMBULATORY_CARE_PROVIDER_SITE_OTHER): Payer: 59 | Admitting: Obstetrics and Gynecology

## 2017-05-30 VITALS — BP 112/76 | Wt 232.0 lb

## 2017-05-30 DIAGNOSIS — O24113 Pre-existing diabetes mellitus, type 2, in pregnancy, third trimester: Secondary | ICD-10-CM | POA: Diagnosis not present

## 2017-05-30 DIAGNOSIS — Z3A35 35 weeks gestation of pregnancy: Secondary | ICD-10-CM | POA: Diagnosis not present

## 2017-05-30 DIAGNOSIS — O24313 Unspecified pre-existing diabetes mellitus in pregnancy, third trimester: Secondary | ICD-10-CM

## 2017-05-30 DIAGNOSIS — O0991 Supervision of high risk pregnancy, unspecified, first trimester: Secondary | ICD-10-CM

## 2017-05-30 DIAGNOSIS — O99213 Obesity complicating pregnancy, third trimester: Secondary | ICD-10-CM

## 2017-05-30 DIAGNOSIS — O99210 Obesity complicating pregnancy, unspecified trimester: Secondary | ICD-10-CM | POA: Insufficient documentation

## 2017-05-30 DIAGNOSIS — Z113 Encounter for screening for infections with a predominantly sexual mode of transmission: Secondary | ICD-10-CM | POA: Diagnosis not present

## 2017-05-30 DIAGNOSIS — O34219 Maternal care for unspecified type scar from previous cesarean delivery: Secondary | ICD-10-CM

## 2017-05-30 HISTORY — DX: Obesity complicating pregnancy, unspecified trimester: O99.210

## 2017-05-30 LAB — FETAL NONSTRESS TEST

## 2017-05-30 NOTE — Progress Notes (Signed)
Routine Prenatal Care Visit  Subjective  Cheryl Carpenter is a 27 y.o. G2P1001 at [redacted]w[redacted]d being seen today for ongoing prenatal care.  She is currently monitored for the following issues for this high-risk pregnancy and has ALLERGIC RHINITIS; Diabetes mellitus, type 2 (HCC); Supervision of high risk pregnancy, antepartum, first trimester; Pre-existing diabetes mellitus in pregnancy; Previous cesarean delivery affecting pregnancy, antepartum; and Obesity affecting pregnancy, antepartum on their problem list.  ----------------------------------------------------------------------------------- Patient reports no complaints.   Contractions: Irregular. Vag. Bleeding: None.  Movement: Present. Denies leaking of fluid.  ----------------------------------------------------------------------------------- The following portions of the patient's history were reviewed and updated as appropriate: allergies, current medications, past family history, past medical history, past social history, past surgical history and problem list. Problem list updated.   Objective  Blood pressure 112/76, weight 232 lb (105.2 kg), last menstrual period 08/30/2016. Pregravid weight 210 lb (95.3 kg) Total Weight Gain 22 lb (9.979 kg) Urinalysis:      Fetal Status: Fetal Heart Rate (bpm): 160 Fundal Height: 37 cm Movement: Present  Presentation: Vertex  General:  Alert, oriented and cooperative. Patient is in no acute distress.  Skin: Skin is warm and dry. No rash noted.   Cardiovascular: Normal heart rate noted  Respiratory: Normal respiratory effort, no problems with respiration noted  Abdomen: Soft, gravid, appropriate for gestational age. Pain/Pressure: Absent     Pelvic:  Cervical exam deferred        Extremities: Normal range of motion.     ental Status: Normal mood and affect. Normal behavior. Normal judgment and thought content.   Baseline: 160 Variability: moderate Accelerations: present Decelerations:  absent Tocometry: N/A The patient was monitored for 30 minutes, fetal heart rate tracing was deemed reactive, category I tracing,    Assessment   27 y.o. G2P1001 at [redacted]w[redacted]d by  07/02/2017, by Ultrasound presenting for routine prenatal visit  Plan    SECOND Problems (from 11/10/16 to present)    Problem Noted Resolved   Obesity affecting pregnancy, antepartum 05/30/2017 by Vena Austria, MD No   Pre-existing diabetes mellitus in pregnancy 12/13/2016 by Farrel Conners, CNM No   Overview Addendum 05/30/2017 10:13 AM by Vena Austria, MD    Current Diabetic Medications:  Metformin  Arly.Keller ] Aspirin 81 mg daily after 12 weeks; discontinue after 36 weeks (? A2/B GDM) - metformin BID  For A2/B GDM or higher classes of DM  Diabetes Education and Testing Supplies  Nutrition Counsult  Fetal ECHO after 22-24 weeks   Eye exam for retina evaluation   Baseline EKG  US fetal growth every 4 weeks starting at 28 weeks        05/04/2017 at 32 weeks EFW of 1901g or 4lbs 3oz c/w 55%ile  Twice weekly NST starting at [redacted] weeks gestation  Delivery planning contingent on fetal growth, AFI, glycemic control, and other co-morbidities but at least by 39 weeks  Baseline and surveillance labs (pulled in from [facility], refresh links as needed)  Lab Results  Component Value Date   CREATININE 0.71 03/13/2015   LABPROT 13.7 03/21/2008   No results found for: HGBA1C  Antenatal Testing Class of DM U/S NST/AFI DELIVERY  Diabetes   A1 - good control - O24.410    A2 - good control - O24.419      A2  - poor control or poor compliance - O24.419, E11.65   (Macrosomia or polyhydramnios) **E11.65 is extra code for poor control**    A2/B - O24.919  and B-C O24.319  Poor control B-C or D-R-F-T - O24.319  or  Type I DM - O24.019  20-38  20-38  20-24-28-32-36   20-24-28-32-35-38//fetal echo  20-24-27-30-33-36-38//fetal echo  40  32//2 x wk  32//2 x wk   32//2 x wk  28//BPP  wkly then 32//2 x wk  40  39  PRN   39  PRN         Previous cesarean delivery affecting pregnancy, antepartum 12/13/2016 by Farrel Conners, CNM No   Diabetes mellitus, type 2 (HCC) 11/10/2016 by Oswaldo Conroy, CNM No   Supervision of high risk pregnancy, antepartum, first trimester 11/10/2016 by Oswaldo Conroy, CNM No   Overview Addendum 05/30/2017 10:09 AM by Vena Austria, MD    Clinic [facility] Prenatal Labs  Dating 6wk6d ultrasound Blood type: A/Positive/-- (11/19 1016)   Genetic Screen 1 Screen: negative Antibody:Negative (11/19 1016)  Anatomic [place] Normal Female Rubella: 6.69 (11/19 1016) Varicella:    GTT N/A DM II RPR: Non Reactive (11/19 1016)   Rhogam N/A HBsAg: Negative (11/19 1016)   TDaP vaccine  04/20/17                     HIV: Non Reactive (11/19 1016)   Baby Food                                GBS:   Contraception BTL Pap: 08/18/2016 NILM  CBB   HgbAA  CS/VBAC Repeat and BTL   Support Person                  Gestational age appropriate obstetric precautions including but not limited to vaginal bleeding, contractions, leaking of fluid and fetal movement were reviewed in detail with the patient.   - started checking BG and improvement in the past week with most values at or near goal - next growth scan at 36 weeks, most recent growth 05/04/2017 EFW of 1901g or 4lbs 3oz c/w 55%ile - has not had any 28 week labs, will obtain CBC, RPR, HIV, and HgbA1C  Return in about 4 days (around 06/03/2017) for NST/AFI and ROB.  Vena Austria, MD, [facility], [facility] 05/30/2017, 5:52 PM

## 2017-05-30 NOTE — Progress Notes (Signed)
Rob NST

## 2017-05-31 ENCOUNTER — Telehealth: Payer: Self-pay

## 2017-05-31 ENCOUNTER — Other Ambulatory Visit: Payer: Self-pay | Admitting: Obstetrics and Gynecology

## 2017-05-31 DIAGNOSIS — O24913 Unspecified diabetes mellitus in pregnancy, third trimester: Secondary | ICD-10-CM

## 2017-05-31 DIAGNOSIS — Z3689 Encounter for other specified antenatal screening: Secondary | ICD-10-CM

## 2017-05-31 LAB — CBC
Hematocrit: 39.2 % (ref 34.0–46.6)
Hemoglobin: 12.8 g/dL (ref 11.1–15.9)
MCH: 28.3 pg (ref 26.6–33.0)
MCHC: 32.7 g/dL (ref 31.5–35.7)
MCV: 87 fL (ref 79–97)
Platelets: 206 10*3/uL (ref 150–379)
RBC: 4.52 x10E6/uL (ref 3.77–5.28)
RDW: 15.5 % — ABNORMAL HIGH (ref 12.3–15.4)
WBC: 11.6 10*3/uL — ABNORMAL HIGH (ref 3.4–10.8)

## 2017-05-31 LAB — HEMOGLOBIN A1C
Est. average glucose Bld gHb Est-mCnc: 123 mg/dL
Hgb A1c MFr Bld: 5.9 % — ABNORMAL HIGH (ref 4.8–5.6)

## 2017-05-31 LAB — HIV ANTIBODY (ROUTINE TESTING W REFLEX): HIV Screen 4th Generation wRfx: NONREACTIVE

## 2017-05-31 LAB — RPR: RPR Ser Ql: NONREACTIVE

## 2017-05-31 NOTE — Telephone Encounter (Signed)
FMLA/DISABILITY form for [employer] filled out, signature obtained, and given to [place] for processing. 

## 2017-06-02 ENCOUNTER — Encounter: Payer: Self-pay | Admitting: Obstetrics and Gynecology

## 2017-06-02 ENCOUNTER — Ambulatory Visit (INDEPENDENT_AMBULATORY_CARE_PROVIDER_SITE_OTHER): Payer: 59 | Admitting: Obstetrics and Gynecology

## 2017-06-02 ENCOUNTER — Ambulatory Visit (INDEPENDENT_AMBULATORY_CARE_PROVIDER_SITE_OTHER): Payer: 59

## 2017-06-02 VITALS — BP 112/72 | Wt 228.0 lb

## 2017-06-02 DIAGNOSIS — Z3A35 35 weeks gestation of pregnancy: Secondary | ICD-10-CM | POA: Diagnosis not present

## 2017-06-02 DIAGNOSIS — O24913 Unspecified diabetes mellitus in pregnancy, third trimester: Secondary | ICD-10-CM

## 2017-06-02 DIAGNOSIS — O99210 Obesity complicating pregnancy, unspecified trimester: Secondary | ICD-10-CM

## 2017-06-02 DIAGNOSIS — O34219 Maternal care for unspecified type scar from previous cesarean delivery: Secondary | ICD-10-CM

## 2017-06-02 DIAGNOSIS — O0991 Supervision of high risk pregnancy, unspecified, first trimester: Secondary | ICD-10-CM | POA: Diagnosis not present

## 2017-06-02 DIAGNOSIS — Z3689 Encounter for other specified antenatal screening: Secondary | ICD-10-CM

## 2017-06-02 DIAGNOSIS — E119 Type 2 diabetes mellitus without complications: Secondary | ICD-10-CM

## 2017-06-02 DIAGNOSIS — Z7689 Persons encountering health services in other specified circumstances: Secondary | ICD-10-CM | POA: Diagnosis not present

## 2017-06-02 LAB — FETAL NONSTRESS TEST

## 2017-06-02 NOTE — Progress Notes (Signed)
Pt reports no problems. AFI/NST today.

## 2017-06-02 NOTE — Patient Instructions (Signed)

## 2017-06-02 NOTE — Progress Notes (Addendum)
Routine Prenatal Care Visit  Subjective  Cheryl Carpenter is a 27 y.o. G2P1001 at [redacted]w[redacted]d being seen today for ongoing prenatal care.  She is currently monitored for the following issues for this high-risk pregnancy and has ALLERGIC RHINITIS; Diabetes mellitus, type 2 (HCC); Supervision of high risk pregnancy, antepartum, first trimester; Pre-existing diabetes mellitus in pregnancy; Previous cesarean delivery affecting pregnancy, antepartum; and Obesity affecting pregnancy, antepartum on their problem list.  ----------------------------------------------------------------------------------- Patient reports no complaints.   Contractions: Not present. Vag. Bleeding: None.  Movement: Present. Denies leaking of fluid.  ----------------------------------------------------------------------------------- The following portions of the patient's history were reviewed and updated as appropriate: allergies, current medications, past family history, past medical history, past social history, past surgical history and problem list. Problem list updated.   Objective  Blood pressure 112/72, weight 228 lb (103.4 kg), last menstrual period 08/30/2016. Pregravid weight 210 lb (95.3 kg) Total Weight Gain 18 lb (8.165 kg) Urinalysis: Urine Protein: Negative Urine Glucose: Negative  Fetal Status: Fetal Heart Rate (bpm): 150   Movement: Present     General:  Alert, oriented and cooperative. Patient is in no acute distress.  Skin: Skin is warm and dry. No rash noted.   Cardiovascular: Normal heart rate noted  Respiratory: Normal respiratory effort, no problems with respiration noted  Abdomen: Soft, gravid, appropriate for gestational age. Pain/Pressure: Absent     Pelvic:  Cervical exam deferred        Extremities: Normal range of motion.     ental Status: Normal mood and affect. Normal behavior. Normal judgment and thought content.   NST: 150s, moderate variability, more than 2 15x15 accelerations, no  decelerations, category I tracing.   Glucose Log: Breakfast: Lunch: 92 176 106 100 103 98 100 80 92 Dinner 122 86 74 80 100 90 80 75 90    Assessment   27 y.o. G2P1001 at [redacted]w[redacted]d by  07/02/2017, by Ultrasound presenting for routine prenatal visit  Plan   SECOND Problems (from 11/10/16 to present)    Problem Noted Resolved   Obesity affecting pregnancy, antepartum 05/30/2017 by Vena Austria, MD No   Pre-existing diabetes mellitus in pregnancy 12/13/2016 by Farrel Conners, CNM No   Overview Addendum 05/30/2017 10:13 AM by Vena Austria, MD    Current Diabetic Medications:  Metformin  Arly.Keller ] Aspirin 81 mg daily after 12 weeks; discontinue after 36 weeks (? A2/B GDM) - metformin BID  For A2/B GDM or higher classes of DM  Diabetes Education and Testing Supplies  Nutrition Counsult  Fetal ECHO after 22-24 weeks   Eye exam for retina evaluation   Baseline EKG  US fetal growth every 4 weeks starting at 28 weeks        05/04/2017 at 32 weeks EFW of 1901g or 4lbs 3oz c/w 55%ile  Twice weekly NST starting at [redacted] weeks gestation  Delivery planning contingent on fetal growth, AFI, glycemic control, and other co-morbidities but at least by 39 weeks  Baseline and surveillance labs (pulled in from [facility], refresh links as needed)  Lab Results  Component Value Date   CREATININE 0.71 03/13/2015   LABPROT 13.7 03/21/2008   No results found for: HGBA1C  Antenatal Testing Class of DM U/S NST/AFI DELIVERY  Diabetes   A1 - good control - O24.410    A2 - good control - O24.419      A2  - poor control or poor compliance -  O24.419, E11.65   (Macrosomia or polyhydramnios) **E11.65 is extra code for poor control**    A2/B - O24.919  and B-C O24.319  Poor control B-C or D-R-F-T - O24.319  or  Type I DM - O24.019  20-38  20-38  20-24-28-32-36   20-24-28-32-35-38//fetal echo  20-24-27-30-33-36-38//fetal echo  40  32//2 x  wk  32//2 x wk   32//2 x wk  28//BPP wkly then 32//2 x wk  40  39  PRN   39  PRN         Previous cesarean delivery affecting pregnancy, antepartum 12/13/2016 by Farrel Conners, CNM No   Diabetes mellitus, type 2 (HCC) 11/10/2016 by Oswaldo Conroy, CNM No   Supervision of high risk pregnancy, antepartum, first trimester 11/10/2016 by Oswaldo Conroy, CNM No   Overview Addendum 05/30/2017 10:09 AM by Vena Austria, MD    Clinic [facility] Prenatal Labs  Dating 6wk6d ultrasound Blood type: A/Positive/-- (11/19 1016)   Genetic Screen 1 Screen: negative Antibody:Negative (11/19 1016)  Anatomic [place] Normal Female Rubella: 6.69 (11/19 1016) Varicella:    GTT N/A DM II RPR: Non Reactive (11/19 1016)   Rhogam N/A HBsAg: Negative (11/19 1016)   TDaP vaccine  04/20/17                     HIV: Non Reactive (11/19 1016)   Baby Food                                GBS:   Contraception BTL Pap: 08/18/2016 NILM  CBB   HgbAA  CS/VBAC Repeat and BTL   Support Person                  Gestational age appropriate obstetric precautions including but not limited to vaginal bleeding, contractions, leaking of fluid and fetal movement were reviewed in detail with the patient.    Return in about 4 days (around 06/06/2017) for ROB, NST.  Glucose log reviewed- excellent control, continue with once daily metformin Growth [place] today 54%.  Continue with twice weekly NSTs GBS, GC/CT collected today.  Adelene Idler MD [facility], [facility] 06/02/17 10:25 AM

## 2017-06-06 ENCOUNTER — Ambulatory Visit (INDEPENDENT_AMBULATORY_CARE_PROVIDER_SITE_OTHER): Payer: 59 | Admitting: Obstetrics & Gynecology

## 2017-06-06 VITALS — BP 120/80 | Wt 227.0 lb

## 2017-06-06 DIAGNOSIS — O0991 Supervision of high risk pregnancy, unspecified, first trimester: Secondary | ICD-10-CM | POA: Diagnosis not present

## 2017-06-06 DIAGNOSIS — O24313 Unspecified pre-existing diabetes mellitus in pregnancy, third trimester: Secondary | ICD-10-CM | POA: Diagnosis not present

## 2017-06-06 DIAGNOSIS — O99210 Obesity complicating pregnancy, unspecified trimester: Secondary | ICD-10-CM | POA: Diagnosis not present

## 2017-06-06 DIAGNOSIS — O34219 Maternal care for unspecified type scar from previous cesarean delivery: Secondary | ICD-10-CM

## 2017-06-06 DIAGNOSIS — Z3A36 36 weeks gestation of pregnancy: Secondary | ICD-10-CM

## 2017-06-06 LAB — GC/CHLAMYDIA PROBE AMP
Chlamydia trachomatis, NAA: NEGATIVE
Neisseria gonorrhoeae by PCR: NEGATIVE

## 2017-06-06 LAB — CULTURE, BETA STREP (GROUP B ONLY): Strep Gp B Culture: NEGATIVE

## 2017-06-06 NOTE — Progress Notes (Signed)
Negative, released to mychart

## 2017-06-06 NOTE — Progress Notes (Addendum)
  Subjective  Fetal Movement? yes Contractions? no Leaking Fluid? no Vaginal Bleeding? no BS WNL fasting and after Objective  BP 120/80   Wt 227 lb (103 kg)   LMP 08/30/2016 (Approximate)   BMI 37.77 kg/m  General: NAD Pumonary: no increased work of breathing Abdomen: gravid, non-tender Extremities: no edema Psychiatric: mood appropriate, affect full  Assessment  27 y.o. G2P1001 at [redacted]w[redacted]d by  07/02/2017, by Ultrasound presenting for routine prenatal visit  Plan   Problem List Items Addressed This Visit      Endocrine   Pre-existing diabetes mellitus in pregnancy     Other   Supervision of high risk pregnancy, antepartum, first trimester   Previous cesarean delivery affecting pregnancy, antepartum   Obesity affecting pregnancy, antepartum    Other Visit Diagnoses    [redacted] weeks gestation of pregnancy    -  Primary    GBS Neg BS under control, Metformin daily CS discussed, BTL also  A NST procedure was performed with FHR monitoring and a normal baseline established, appropriate time of 20-40 minutes of evaluation, and accels >2 seen w 15x15 characteristics.  Results show a REACTIVE NST.   Annamarie Major, MD, [facility], [facility] 06/06/2017  8:49 AM

## 2017-06-08 ENCOUNTER — Ambulatory Visit (INDEPENDENT_AMBULATORY_CARE_PROVIDER_SITE_OTHER): Payer: 59 | Admitting: Obstetrics and Gynecology

## 2017-06-08 VITALS — BP 108/72 | Wt 228.0 lb

## 2017-06-08 DIAGNOSIS — O99210 Obesity complicating pregnancy, unspecified trimester: Secondary | ICD-10-CM

## 2017-06-08 DIAGNOSIS — O34219 Maternal care for unspecified type scar from previous cesarean delivery: Secondary | ICD-10-CM

## 2017-06-08 DIAGNOSIS — O24313 Unspecified pre-existing diabetes mellitus in pregnancy, third trimester: Secondary | ICD-10-CM

## 2017-06-08 DIAGNOSIS — E119 Type 2 diabetes mellitus without complications: Secondary | ICD-10-CM

## 2017-06-08 DIAGNOSIS — Z3A39 39 weeks gestation of pregnancy: Secondary | ICD-10-CM

## 2017-06-08 NOTE — Progress Notes (Signed)
Routine Prenatal Care Visit  Subjective  Cheryl Carpenter is a 27 y.o. G2P1001 at [redacted]w[redacted]d being seen today for ongoing prenatal care.  She is currently monitored for the following issues for this high-risk pregnancy and has ALLERGIC RHINITIS; Diabetes mellitus, type 2 (HCC); Supervision of high risk pregnancy, antepartum, first trimester; Pre-existing diabetes mellitus in pregnancy; Previous cesarean delivery affecting pregnancy, antepartum; and Obesity affecting pregnancy, antepartum on their problem list.  ----------------------------------------------------------------------------------- Patient reports no complaints and pain in her arms and legs..   Contractions: Not present. Vag. Bleeding: None.  Movement: Present. Denies leaking of fluid.  ----------------------------------------------------------------------------------- The following portions of the patient's history were reviewed and updated as appropriate: allergies, current medications, past family history, past medical history, past social history, past surgical history and problem list. Problem list updated.   Objective  Blood pressure 108/72, weight 228 lb (103.4 kg), last menstrual period 08/30/2016. Pregravid weight 210 lb (95.3 kg) Total Weight Gain 18 lb (8.165 kg) Urinalysis: Urine Protein: Trace Urine Glucose: Negative  Fetal Status: Fetal Heart Rate (bpm): 150 Fundal Height: 41 cm Movement: Present     General:  Alert, oriented and cooperative. Patient is in no acute distress.  Skin: Skin is warm and dry. No rash noted.   Cardiovascular: Normal heart rate noted  Respiratory: Normal respiratory effort, no problems with respiration noted  Abdomen: Soft, gravid, appropriate for gestational age. Pain/Pressure: Present     Pelvic:  Cervical exam performed Dilation: 1 Effacement (%): 0 Station: -3  Extremities: Normal range of motion.     ental Status: Normal mood and affect. Normal behavior. Normal judgment and thought  content.     Assessment   27 y.o. G2P1001 at [redacted]w[redacted]d by  07/02/2017, by Ultrasound presenting for routine prenatal visit  Plan   SECOND Problems (from 11/10/16 to present)    Problem Noted Resolved   Obesity affecting pregnancy, antepartum 05/30/2017 by Vena Austria, MD No   Pre-existing diabetes mellitus in pregnancy 12/13/2016 by Farrel Conners, CNM No   Overview Addendum 05/30/2017 10:13 AM by Vena Austria, MD    Current Diabetic Medications:  Metformin  Arly.Keller ] Aspirin 81 mg daily after 12 weeks; discontinue after 36 weeks (? A2/B GDM) - metformin BID  For A2/B GDM or higher classes of DM  Diabetes Education and Testing Supplies  Nutrition Counsult  Fetal ECHO after 22-24 weeks   Eye exam for retina evaluation   Baseline EKG  US fetal growth every 4 weeks starting at 28 weeks        05/04/2017 at 32 weeks EFW of 1901g or 4lbs 3oz c/w 55%ile  Twice weekly NST starting at [redacted] weeks gestation  Delivery planning contingent on fetal growth, AFI, glycemic control, and other co-morbidities but at least by 39 weeks  Baseline and surveillance labs (pulled in from [facility], refresh links as needed)  Lab Results  Component Value Date   CREATININE 0.71 03/13/2015   LABPROT 13.7 03/21/2008   No results found for: HGBA1C  Antenatal Testing Class of DM U/S NST/AFI DELIVERY  Diabetes   A1 - good control - O24.410    A2 - good control - O24.419      A2  - poor control or poor compliance - O24.419, E11.65   (Macrosomia or polyhydramnios) **E11.65 is extra code for poor control**    A2/B - O24.919  and B-C O24.319  Poor control B-C or D-R-F-T - O24.319  or  Type I DM -  O24.019  20-38  20-38  20-24-28-32-36   20-24-28-32-35-38//fetal echo  20-24-27-30-33-36-38//fetal echo  40  32//2 x wk  32//2 x wk   32//2 x wk  28//BPP wkly then 32//2 x wk  40  39  PRN   39  PRN         Previous cesarean delivery affecting pregnancy,  antepartum 12/13/2016 by Farrel Conners, CNM No   Diabetes mellitus, type 2 (HCC) 11/10/2016 by Oswaldo Conroy, CNM No   Supervision of high risk pregnancy, antepartum, first trimester 11/10/2016 by Oswaldo Conroy, CNM No   Overview Addendum 06/16/2017 11:01 AM by Vena Austria, MD    Clinic [facility] Prenatal Labs  Dating 6wk6d ultrasound Blood type: A/Positive/-- (11/19 1016)   Genetic Screen 1 Screen: negative Antibody:Negative (11/19 1016)  Anatomic [place] Normal Female Rubella: 6.69 (11/19 1016) Varicella:    GTT N/A DM II RPR: Non Reactive (11/19 1016)   Rhogam N/A HBsAg: Negative (11/19 1016)   TDaP vaccine  04/20/17                     HIV: Non Reactive (11/19 1016)   Baby Food                                [id number]  Contraception BTL Pap: 08/18/2016 NILM  CBB   HgbAA  CS/VBAC Repeat and BTL   Support Person                  Gestational age appropriate obstetric precautions including but not limited to vaginal bleeding, contractions, leaking of fluid and fetal movement were reviewed in detail with the patient.    Patient having pain in arms and legs. Reassurance given.  Return for ROB this week as planned.  Adelene Idler MD [facility], [facility] 06/26/17 6:16 PM

## 2017-06-08 NOTE — Progress Notes (Signed)
Pt c/o pelvic pressure and abdominal cramping. Hip pain and pain in upper arms.

## 2017-06-08 NOTE — Progress Notes (Signed)
41

## 2017-06-09 ENCOUNTER — Other Ambulatory Visit (INDEPENDENT_AMBULATORY_CARE_PROVIDER_SITE_OTHER): Payer: 59

## 2017-06-09 ENCOUNTER — Other Ambulatory Visit: Payer: Self-pay | Admitting: Obstetrics & Gynecology

## 2017-06-09 ENCOUNTER — Ambulatory Visit (INDEPENDENT_AMBULATORY_CARE_PROVIDER_SITE_OTHER): Payer: 59 | Admitting: Obstetrics & Gynecology

## 2017-06-09 VITALS — BP 100/60 | Wt 230.0 lb

## 2017-06-09 DIAGNOSIS — O0991 Supervision of high risk pregnancy, unspecified, first trimester: Secondary | ICD-10-CM | POA: Diagnosis not present

## 2017-06-09 DIAGNOSIS — O99210 Obesity complicating pregnancy, unspecified trimester: Secondary | ICD-10-CM | POA: Diagnosis not present

## 2017-06-09 DIAGNOSIS — Z3A36 36 weeks gestation of pregnancy: Secondary | ICD-10-CM

## 2017-06-09 DIAGNOSIS — O24313 Unspecified pre-existing diabetes mellitus in pregnancy, third trimester: Secondary | ICD-10-CM | POA: Diagnosis not present

## 2017-06-09 DIAGNOSIS — O24913 Unspecified diabetes mellitus in pregnancy, third trimester: Secondary | ICD-10-CM | POA: Diagnosis not present

## 2017-06-09 DIAGNOSIS — O34219 Maternal care for unspecified type scar from previous cesarean delivery: Secondary | ICD-10-CM

## 2017-06-09 NOTE — Progress Notes (Signed)
  Subjective  Fetal Movement? yes Contractions? Yes, intermittant; checked yesterday and determined to be braxton-hicks Leaking Fluid? no Vaginal Bleeding? no BS within normal ranges Objective  BP 100/60   Wt 230 lb (104.3 kg)   LMP 08/30/2016 (Approximate)   BMI 38.27 kg/m  General: NAD Pumonary: no increased work of breathing Abdomen: gravid, non-tender Extremities: no edema Psychiatric: mood appropriate, affect full  A NST procedure was performed with FHR monitoring and a normal baseline established, appropriate time of 20-40 minutes of evaluation, and accels >2 seen w 15x15 characteristics.  Results show a REACTIVE NST.   Assessment  27 y.o. G2P1001 at [redacted]w[redacted]d by  07/02/2017, by Ultrasound presenting for routine prenatal visit  Plan   Problem List Items Addressed This Visit      Endocrine   Pre-existing diabetes mellitus in pregnancy     Other   Supervision of high risk pregnancy, antepartum, first trimester   Previous cesarean delivery affecting pregnancy, antepartum   Obesity affecting pregnancy, antepartum    Other Visit Diagnoses    [redacted] weeks gestation of pregnancy    -  Primary    [place] today fr AFI; APT biweekly Metformin and continue glycemic control CS and BTL planned  Annamarie Major, MD, [facility], [facility] 06/09/2017  9:17 AM

## 2017-06-13 ENCOUNTER — Ambulatory Visit (INDEPENDENT_AMBULATORY_CARE_PROVIDER_SITE_OTHER): Payer: 59 | Admitting: Obstetrics & Gynecology

## 2017-06-13 VITALS — BP 120/80 | Wt 230.0 lb

## 2017-06-13 DIAGNOSIS — O34219 Maternal care for unspecified type scar from previous cesarean delivery: Secondary | ICD-10-CM

## 2017-06-13 DIAGNOSIS — O0991 Supervision of high risk pregnancy, unspecified, first trimester: Secondary | ICD-10-CM | POA: Diagnosis not present

## 2017-06-13 DIAGNOSIS — O99210 Obesity complicating pregnancy, unspecified trimester: Secondary | ICD-10-CM

## 2017-06-13 DIAGNOSIS — O24313 Unspecified pre-existing diabetes mellitus in pregnancy, third trimester: Secondary | ICD-10-CM | POA: Diagnosis not present

## 2017-06-13 DIAGNOSIS — Z3A37 37 weeks gestation of pregnancy: Secondary | ICD-10-CM

## 2017-06-13 LAB — FETAL NONSTRESS TEST

## 2017-06-13 NOTE — Progress Notes (Signed)
Prenatal Visit Note Date: 06/13/2017 Clinic: [facility]  Subjective:  Cheryl Carpenter is a 27 y.o. G2P1001 at [redacted]w[redacted]d being seen today for ongoing prenatal care.  She is currently monitored for the following issues for this high-risk pregnancy and has ALLERGIC RHINITIS; Diabetes mellitus, type 2 (HCC); Supervision of high risk pregnancy, antepartum, first trimester; Pre-existing diabetes mellitus in pregnancy; Previous cesarean delivery affecting pregnancy, antepartum; and Obesity affecting pregnancy, antepartum on their problem list.  Patient reports no complaints.   Contractions: Not present. Vag. Bleeding: None.  Movement: Present. Denies leaking of fluid.   The following portions of the patient's history were reviewed and updated as appropriate: allergies, current medications, past family history, past medical history, past social history, past surgical history and problem list. Problem list updated.  Objective:   Vitals:   06/13/17 1001  BP: 120/80  Weight: 230 lb (104.3 kg)    Fetal Status:     Movement: Present     General:  Alert, oriented and cooperative. Patient is in no acute distress.  Skin: Skin is warm and dry. No rash noted.   Cardiovascular: Normal heart rate noted  Respiratory: Normal respiratory effort, no problems with respiration noted  Abdomen: Soft, gravid, appropriate for gestational age. Pain/Pressure: Absent     Pelvic:  Cervical exam deferred        Extremities: Normal range of motion.     Mental Status: Normal mood and affect. Normal behavior. Normal judgment and thought content.   Urinalysis: Urine Protein: 1+ Urine Glucose: Negative  Assessment and Plan:  Pregnancy: G2P1001 at [redacted]w[redacted]d  1. [redacted] weeks gestation of pregnancy  2. Pre-existing diabetes mellitus during pregnancy in third trimester Cont Metformin and monitor BS (all WNL lately)  3. Supervision of high risk pregnancy, antepartum  4. Previous cesarean delivery affecting pregnancy,  antepartum Plans CS, BTL June 6  5. Obesity affecting pregnancy, antepartum APT twice weekly  A NST procedure was performed with FHR monitoring and a normal baseline established, appropriate time of 20-40 minutes of evaluation, and accels >2 seen w 15x15 characteristics.  Results show a REACTIVE NST.   Term labor symptoms and general obstetric precautions including but not limited to vaginal bleeding, contractions, leaking of fluid and fetal movement were reviewed in detail with the patient. Please refer to After Visit Summary for other counseling recommendations.  Return for as scheduled.  Annamarie Major, MD, [facility], [facility] 06/13/2017  10:23 AM

## 2017-06-14 ENCOUNTER — Other Ambulatory Visit: Payer: Self-pay | Admitting: Obstetrics & Gynecology

## 2017-06-14 DIAGNOSIS — O0993 Supervision of high risk pregnancy, unspecified, third trimester: Secondary | ICD-10-CM

## 2017-06-16 ENCOUNTER — Other Ambulatory Visit: Payer: 59

## 2017-06-16 ENCOUNTER — Ambulatory Visit (INDEPENDENT_AMBULATORY_CARE_PROVIDER_SITE_OTHER): Payer: 59 | Admitting: Obstetrics and Gynecology

## 2017-06-16 VITALS — BP 118/72 | Wt 234.0 lb

## 2017-06-16 DIAGNOSIS — O24313 Unspecified pre-existing diabetes mellitus in pregnancy, third trimester: Secondary | ICD-10-CM | POA: Diagnosis not present

## 2017-06-16 DIAGNOSIS — Z3A37 37 weeks gestation of pregnancy: Secondary | ICD-10-CM

## 2017-06-16 DIAGNOSIS — O34219 Maternal care for unspecified type scar from previous cesarean delivery: Secondary | ICD-10-CM

## 2017-06-16 DIAGNOSIS — O0991 Supervision of high risk pregnancy, unspecified, first trimester: Secondary | ICD-10-CM

## 2017-06-16 DIAGNOSIS — O99210 Obesity complicating pregnancy, unspecified trimester: Secondary | ICD-10-CM

## 2017-06-16 LAB — FETAL NONSTRESS TEST

## 2017-06-16 NOTE — Progress Notes (Signed)
ROB AFI/NST 

## 2017-06-16 NOTE — Progress Notes (Signed)
Routine Prenatal Care Visit  Subjective  Cheryl Carpenter is a 27 y.o. G2P1001 at [redacted]w[redacted]d being seen today for ongoing prenatal care.  She is currently monitored for the following issues for this high-risk pregnancy and has ALLERGIC RHINITIS; Diabetes mellitus, type 2 (HCC); Supervision of high risk pregnancy, antepartum, first trimester; Pre-existing diabetes mellitus in pregnancy; Previous cesarean delivery affecting pregnancy, antepartum; and Obesity affecting pregnancy, antepartum on their problem list.  ----------------------------------------------------------------------------------- Patient reports no complaints.   Contractions: Irregular. Vag. Bleeding: None.  Movement: Present. Denies leaking of fluid.  ----------------------------------------------------------------------------------- The following portions of the patient's history were reviewed and updated as appropriate: allergies, current medications, past family history, past medical history, past social history, past surgical history and problem list. Problem list updated.   Objective  Blood pressure 118/72, weight 234 lb (106.1 kg), last menstrual period 08/30/2016. Pregravid weight 210 lb (95.3 kg) Total Weight Gain 24 lb (10.9 kg) Urinalysis: Urine Protein: Negative Urine Glucose: Negative  Fetal Status: Fetal Heart Rate (bpm): 150   Movement: Present  Presentation: Vertex  General:  Alert, oriented and cooperative. Patient is in no acute distress.  Skin: Skin is warm and dry. No rash noted.   Cardiovascular: Normal heart rate noted  Respiratory: Normal respiratory effort, no problems with respiration noted  Abdomen: Soft, gravid, appropriate for gestational age. Pain/Pressure: Absent     Pelvic:  Cervical exam deferred        Extremities: Normal range of motion.     ental Status: Normal mood and affect. Normal behavior. Normal judgment and thought content.   Baseline: 150 Variability: moderate Accelerations:  present Decelerations: absent Tocometry: N/A The patient was monitored for 30 minutes, fetal heart rate tracing was deemed reactive, category I tracing,  US Ob Limited  Result Date: 06/09/2017 ULTRASOUND REPORT Patient Name: Cheryl Carpenter DOB: 05/11/1990 MRN: [id number] Location: [facility] Date of Service: 06/09/2017 Indications:AFI Findings: Mason Jim intrauterine pregnancy is visualized with FHR at 153 BPM. Fetal presentation is Cephalic. Placenta: Anterior, grade 1. AFI: 13.65 cm Impression: 1. [redacted]w[redacted]d Viable Singleton Intrauterine pregnancy dated by previously established criteria. 2. AFI is 13.65 cm. Recommendations: 1.Clinical correlation with the patient's History and Physical Exam. Willette Alma, RDMS, RVT Review of ULTRASOUND.    I have personally reviewed images and report of recent ultrasound done at [facility].    Plan of management to be discussed with patient. Annamarie Major, MD, [facility], [facility] 06/09/2017  2:40 PM   US Ob Limited  Result Date: 05/27/2017 ULTRASOUND REPORT Patient Name: Cheryl Carpenter DOB: 1990/05/01 MRN: [id number] Location: [facility] Date of Service: 05/26/2017 Indications:AFI Findings: Mason Jim intrauterine pregnancy is visualized with FHR at 126 BPM. Fetal presentation is Cephalic. Placenta: Anterior, grade 1. AFI: 22.35 cm Impression: 1. [redacted]w[redacted]d Viable Singleton Intrauterine pregnancy dated by previously established criteria. 2. AFI is 22.35 cm. Recommendations: 1.Clinical correlation with the patient's History and Physical Exam. Willette Alma, RDMS, RVT Review of ULTRASOUND.    I have personally reviewed images and report of recent ultrasound done at [facility].    Plan of management to be discussed with patient. Annamarie Major, MD, [facility], [facility] 05/27/2017  3:21 PM   US Ob Limited  Result Date: 05/19/2017 ULTRASOUND REPORT Patient Name: Cheryl Carpenter DOB: [birth year] MRN: [id number] Location:  [facility] Date of Service: 05/19/2017 Indications:AFI Findings: Mason Jim intrauterine pregnancy is visualized with FHR at 148 BPM. Fetal presentation is Cephalic. Placenta: Anterior, grade 1. AFI: 16.94  cm Impression: 1. [redacted]w[redacted]d Viable Singleton Intrauterine pregnancy dated by previously established criteria. 2. AFI is 16.94 cm. Recommendations: 1.Clinical correlation with the patient's History and Physical Exam. 2. AFI in 1 week, Growth in 2 weeks for Diabetes Mellitus type 2 Willette Alma, RDMS, RVT I have reviewed this ultrasound and the report. I agree with the above assessment and plan. Adelene Idler MD [facility] 05/19/17 3:59 PM    US Ob Follow Up  Result Date: 06/02/2017 ULTRASOUND REPORT Patient Name: Cheryl Carpenter DOB: 19-Jun-1990 MRN: [id number] Location: [facility] Date of Service: 06/02/2017 Indications:growth/afi for DM2 Findings: Mason Jim intrauterine pregnancy is visualized with FHR at 153 BPM. Biometrics give an (U/S) Gestational age of [redacted]w[redacted]d and an (U/S) EDD of 07/01/2017; this correlates with the clinically established Estimated Date of Delivery: 07/02/17. Fetal presentation is Cephalic. Placenta: Anterior, grade 1. AFI: 12.77 cm Growth percentile is 54th. EFW: 6lbs 4oz Impression: 1. [redacted]w[redacted]d Viable Singleton Intrauterine pregnancy previously established criteria. 2. Growth is 54th %ile.  AFI is 12.77 cm. Recommendations: 1.Clinical correlation with the patient's History and Physical Exam. Willette Alma, RDMS, RVT I have reviewed this ultrasound and the report. I agree with the above assessment and plan. Adelene Idler MD [facility] 06/02/17 10:20 AM       Assessment   27 y.o. G2P1001 at [redacted]w[redacted]d by  07/02/2017, by Ultrasound presenting for routine prenatal visit  Plan   SECOND Problems (from 11/10/16 to present)    Problem Noted Resolved   Obesity affecting pregnancy, antepartum 05/30/2017 by Vena Austria, MD No   Pre-existing diabetes mellitus in pregnancy 12/13/2016 by Farrel Conners, CNM No   Overview Addendum 05/30/2017 10:13 AM by Vena Austria, MD    Current Diabetic Medications:  Metformin  Arly.Keller ] Aspirin 81 mg daily after 12 weeks; discontinue after 36 weeks (? A2/B GDM) - metformin BID  For A2/B GDM or higher classes of DM  Diabetes Education and Testing Supplies  Nutrition Counsult  Fetal ECHO after 22-24 weeks   Eye exam for retina evaluation   Baseline EKG  US fetal growth every 4 weeks starting at 28 weeks        05/04/2017 at 32 weeks EFW of 1901g or 4lbs 3oz c/w 55%ile  Twice weekly NST starting at [redacted] weeks gestation  Delivery planning contingent on fetal growth, AFI, glycemic control, and other co-morbidities but at least by 39 weeks  Baseline and surveillance labs (pulled in from [facility], refresh links as needed)  Lab Results  Component Value Date   CREATININE 0.71 03/13/2015   LABPROT 13.7 03/21/2008   No results found for: HGBA1C  Antenatal Testing Class of DM U/S NST/AFI DELIVERY  Diabetes   A1 - good control - O24.410    A2 - good control - O24.419      A2  - poor control or poor compliance - O24.419, E11.65   (Macrosomia or polyhydramnios) **E11.65 is extra code for poor control**    A2/B - O24.919  and B-C O24.319  Poor control B-C or D-R-F-T - O24.319  or  Type I DM - O24.019  20-38  20-38  20-24-28-32-36   20-24-28-32-35-38//fetal echo  20-24-27-30-33-36-38//fetal echo  40  32//2 x wk  32//2 x wk   32//2 x wk  28//BPP wkly then 32//2 x wk  40  39  PRN   39  PRN         Previous cesarean delivery  affecting pregnancy, antepartum 12/13/2016 by Farrel Conners, CNM No   Diabetes mellitus, type 2 (HCC) 11/10/2016 by Oswaldo Conroy, CNM No   Supervision of high risk pregnancy, antepartum, first trimester 11/10/2016 by Oswaldo Conroy, CNM No   Overview Addendum 06/16/2017 11:01 AM by  Vena Austria, MD    Clinic [facility] Prenatal Labs  Dating 6wk6d ultrasound Blood type: A/Positive/-- (11/19 1016)   Genetic Screen 1 Screen: negative Antibody:Negative (11/19 1016)  Anatomic [place] Normal Female Rubella: 6.69 (11/19 1016) Varicella:    GTT N/A DM II RPR: Non Reactive (11/19 1016)   Rhogam N/A HBsAg: Negative (11/19 1016)   TDaP vaccine  04/20/17                     HIV: Non Reactive (11/19 1016)   Baby Food                                [id number]  Contraception BTL Pap: 08/18/2016 NILM  CBB   HgbAA  CS/VBAC Repeat and BTL   Support Person                  Gestational age appropriate obstetric precautions including but not limited to vaginal bleeding, contractions, leaking of fluid and fetal movement were reviewed in detail with the patient.   - reactive NST and normal AFI - BG remain well controlled and normal on review of log  Return in about 1 week (around 06/23/2017) for ROB/NST/AFI.  Vena Austria, MD, [facility], [facility] 06/16/2017, 11:50 AM

## 2017-06-17 ENCOUNTER — Observation Stay
Admission: EM | Admit: 2017-06-17 | Discharge: 2017-06-17 | Disposition: A | Discharge status: Home / Self Care | Payer: 59 | Attending: Obstetrics and Gynecology | Admitting: Obstetrics and Gynecology

## 2017-06-17 ENCOUNTER — Other Ambulatory Visit: Payer: Self-pay

## 2017-06-17 ENCOUNTER — Encounter: Payer: Self-pay | Admitting: *Deleted

## 2017-06-17 DIAGNOSIS — O471 False labor at or after 37 completed weeks of gestation: Secondary | ICD-10-CM | POA: Diagnosis not present

## 2017-06-17 DIAGNOSIS — O24313 Unspecified pre-existing diabetes mellitus in pregnancy, third trimester: Secondary | ICD-10-CM

## 2017-06-17 DIAGNOSIS — O0991 Supervision of high risk pregnancy, unspecified, first trimester: Secondary | ICD-10-CM

## 2017-06-17 DIAGNOSIS — E119 Type 2 diabetes mellitus without complications: Secondary | ICD-10-CM

## 2017-06-17 DIAGNOSIS — O24913 Unspecified diabetes mellitus in pregnancy, third trimester: Secondary | ICD-10-CM | POA: Insufficient documentation

## 2017-06-17 DIAGNOSIS — O99210 Obesity complicating pregnancy, unspecified trimester: Secondary | ICD-10-CM

## 2017-06-17 DIAGNOSIS — Z3A37 37 weeks gestation of pregnancy: Secondary | ICD-10-CM | POA: Insufficient documentation

## 2017-06-17 DIAGNOSIS — O479 False labor, unspecified: Secondary | ICD-10-CM | POA: Diagnosis not present

## 2017-06-17 DIAGNOSIS — Z7984 Long term (current) use of oral hypoglycemic drugs: Secondary | ICD-10-CM | POA: Diagnosis not present

## 2017-06-17 DIAGNOSIS — O34219 Maternal care for unspecified type scar from previous cesarean delivery: Secondary | ICD-10-CM

## 2017-06-17 HISTORY — DX: Type 2 diabetes mellitus without complications: E11.9

## 2017-06-17 NOTE — Final Progress Note (Addendum)
Physician Final Progress Note  Patient ID: Cheryl Carpenter MRN: [id number] DOB/AGE: [birth year] 27 y.o.  Admit date: 06/17/2017 Admitting provider: Vena Austria, MD Discharge date: 06/17/2017  Admission Diagnoses: Contractions  Discharge Diagnoses:  Active Problems:   * No active hospital problems. *  History of Present Illness: The patient is a 27 y.o. female G2P1001 at [redacted]w[redacted]d who presents for contractions that began this morning at around 0400. They are intermittent, occurring once every 15-20 minutes. She rates the pain as 7/10. She tried taking a shower without relief. Denies vaginal bleeding, and loss of fluid. Endorses good fetal movement.  Review of systems negative unless otherwise noted in HPI.  Past Medical History:  Diagnosis Date  . Diabetes mellitus without complication [facility])     Past Surgical History:  Procedure Laterality Date  . CESAREAN SECTION  2010   failed VAVD  . FOOT SURGERY    . nexplanon removal  07/2016    No current facility-administered medications on file prior to encounter.    Current Outpatient Medications on File Prior to Encounter  Medication Sig Dispense Refill  . metFORMIN (GLUCOPHAGE) 500 MG tablet Take by mouth daily with breakfast.     . Prenatal Vit-Fe Fumarate-FA (MULTIVITAMIN-PRENATAL) 27-0.8 MG TABS tablet Take 1 tablet daily at 12 noon by mouth.      Allergies  Allergen Reactions  . Fish Allergy Anaphylaxis  . Peanut-Containing Drug Products Anaphylaxis  . Shellfish Allergy Anaphylaxis  . Sulfa Antibiotics Other (See Comments)    vomiting    Social History   Socioeconomic History  . Marital status: Significant Other    Spouse name: Not on file  . Number of children: Not on file  . Years of education: Not on file  . Highest education level: Not on file  Occupational History  . Not on file  Social Needs  . Financial resource strain: Not on file  . Food insecurity:    Worry: Not on file    Inability: Not on file  .  Transportation needs:    Medical: Not on file    Non-medical: Not on file  Tobacco Use  . Smoking status: Never Smoker  . Smokeless tobacco: Never Used  Substance and Sexual Activity  . Alcohol use: No  . Drug use: No  . Sexual activity: Yes    Birth control/protection: Surgical  Lifestyle  . Physical activity:    Days per week: Not on file    Minutes per session: Not on file  . Stress: Not on file  Relationships  . Social connections:    Talks on phone: Not on file    Gets together: Not on file    Attends religious service: Not on file    Active member of club or organization: Not on file    Attends meetings of clubs or organizations: Not on file    Relationship status: Not on file  . Intimate partner violence:    Fear of current or ex partner: Not on file    Emotionally abused: Not on file    Physically abused: Not on file    Forced sexual activity: Not on file  Other Topics Concern  . Not on file  Social History Narrative  . Not on file    Physical Exam: BP 120/78 (BP Location: Left Arm)   Pulse (!) 107   Temp 98.7 F (37.1 C) (Oral)   Resp 18   Ht  (1.651 m)   Wt 230 lb (104.3 kg)  LMP 08/30/2016 (Approximate)   BMI 38.27 kg/m   Gen: NAD CV: Regular rate Pulm: No increased work of breathing Pelvic: 0/thick/-3 Ext: No signs of DVT on exam  NST: Baseline: 150 Variability: moderate Accelerations: present Decelerations: absent Tocometry: occasional irritability The patient was monitored for 20+ minutes, fetal heart rate tracing was deemed reactive, Category I tracing.  Consults: None  Significant Findings/ Diagnostic Studies: N/A  Procedures: SVE, NST  Discharge Condition: good  Disposition: Discharge disposition: 01-Home or Self Care      Diet: Regular diet  Discharge Activity: Activity as tolerated  Discharge Instructions    Discharge activity:  No Restrictions   Complete by:  As directed    Discharge diet:  No restrictions    Complete by:  As directed    Fetal Kick Count:  Lie on our left side for one hour after a meal, and count the number of times your baby kicks.  If it is less than 5 times, get up, move around and drink some juice.  Repeat the test 30 minutes later.  If it is still less than 5 kicks in an hour, notify your doctor.   Complete by:  As directed    LABOR:  When conractions begin, you should start to time them from the beginning of one contraction to the beginning  of the next.  When contractions are 5 - 10 minutes apart or less and have been regular for at least an hour, you should call your health care provider.   Complete by:  As directed    No sexual activity restrictions   Complete by:  As directed    Notify physician for bleeding from the vagina   Complete by:  As directed    Notify physician for blurring of vision or spots before the eyes   Complete by:  As directed    Notify physician for chills or fever   Complete by:  As directed    Notify physician for fainting spells, "black outs" or loss of consciousness   Complete by:  As directed    Notify physician for increase in vaginal discharge   Complete by:  As directed    Notify physician for leaking of fluid   Complete by:  As directed    Notify physician for pain or burning when urinating   Complete by:  As directed    Notify physician for pelvic pressure (sudden increase)   Complete by:  As directed    Notify physician for severe or continued nausea or vomiting   Complete by:  As directed    Notify physician for sudden gushing of fluid from the vagina (with or without continued leaking)   Complete by:  As directed    Notify physician for sudden, constant, or occasional abdominal pain   Complete by:  As directed    Notify physician if baby moving less than usual   Complete by:  As directed      Allergies as of 06/17/2017      Reactions   Fish Allergy Anaphylaxis   Peanut-containing Drug Products Anaphylaxis   Shellfish Allergy  Anaphylaxis   Sulfa Antibiotics Other (See Comments)   vomiting      Medication List    TAKE these medications   metFORMIN 500 MG tablet Commonly known as:  GLUCOPHAGE Take by mouth daily with breakfast.   multivitamin-prenatal 27-0.8 MG Tabs tablet Take 1 tablet daily at 12 noon by mouth.      Patient advised to  increase hydration and try Tylenol/Benadryl for pain relief/rest. Return precautions emphasized.  Signed: Oswaldo Conroy, CNM  06/17/2017, 1:22 PM

## 2017-06-17 NOTE — OB Triage Note (Signed)
Ctx since 0400 this am. Reports ctx every 15-20 minutes, worsening in intensity. Denies LOF/gush of fluid. Denies vaginal bleeding. Reports good fetal movement. Cheryl Carpenter

## 2017-06-17 NOTE — Discharge Summary (Signed)
See Final Progress Note 06/17/2017.  Marcelyn Bruins, CNM 06/17/2017  1:51 PM

## 2017-06-21 ENCOUNTER — Ambulatory Visit (INDEPENDENT_AMBULATORY_CARE_PROVIDER_SITE_OTHER): Payer: 59 | Admitting: Obstetrics and Gynecology

## 2017-06-21 VITALS — BP 112/88 | Wt 231.0 lb

## 2017-06-21 DIAGNOSIS — O0991 Supervision of high risk pregnancy, unspecified, first trimester: Secondary | ICD-10-CM

## 2017-06-21 DIAGNOSIS — O24313 Unspecified pre-existing diabetes mellitus in pregnancy, third trimester: Secondary | ICD-10-CM | POA: Diagnosis not present

## 2017-06-21 DIAGNOSIS — O34219 Maternal care for unspecified type scar from previous cesarean delivery: Secondary | ICD-10-CM

## 2017-06-21 DIAGNOSIS — Z3A38 38 weeks gestation of pregnancy: Secondary | ICD-10-CM

## 2017-06-21 LAB — FETAL NONSTRESS TEST

## 2017-06-21 NOTE — Progress Notes (Signed)
Routine Prenatal Care Visit  Subjective  Cheryl Carpenter is a 27 y.o. G2P1001 at [redacted]w[redacted]d being seen today for ongoing prenatal care.  She is currently monitored for the following issues for this high-risk pregnancy and has ALLERGIC RHINITIS; Diabetes mellitus, type 2 (HCC); Supervision of high risk pregnancy, antepartum, first trimester; Pre-existing diabetes mellitus in pregnancy; Previous cesarean delivery affecting pregnancy, antepartum; and Obesity affecting pregnancy, antepartum on their problem list.  ----------------------------------------------------------------------------------- Patient reports no complaints.   Contractions: Irregular. Vag. Bleeding: None.  Movement: Present. Denies leaking of fluid.  ----------------------------------------------------------------------------------- The following portions of the patient's history were reviewed and updated as appropriate: allergies, current medications, past family history, past medical history, past social history, past surgical history and problem list. Problem list updated.   Objective  Blood pressure 112/88, weight 231 lb (104.8 kg), last menstrual period 08/30/2016. Pregravid weight 210 lb (95.3 kg) Total Weight Gain 21 lb (9.526 kg) Urinalysis: Urine Protein: Trace Urine Glucose: Negative  Fetal Status: Fetal Heart Rate (bpm): 150   Movement: Present  Presentation: Vertex  General:  Alert, oriented and cooperative. Patient is in no acute distress.  Skin: Skin is warm and dry. No rash noted.   Cardiovascular: Normal heart rate noted  Respiratory: Normal respiratory effort, no problems with respiration noted  Abdomen: Soft, gravid, appropriate for gestational age. Pain/Pressure: Present     Pelvic:  Cervical exam performed Dilation: 1 Effacement (%): 50 Station: -3  Extremities: Normal range of motion.     ental Status: Normal mood and affect. Normal behavior. Normal judgment and thought content.   Baseline:  160 Variability: moderate Accelerations: present Decelerations: absent Tocometry: N/A The patient was monitored for 30 minutes, fetal heart rate tracing was deemed reactive, category I tracing,  CPT [phone]   Assessment   27 y.o. G2P1001 at [redacted]w[redacted]d by  07/02/2017, by Ultrasound presenting for routine prenatal visit  Plan   SECOND Problems (from 11/10/16 to present)    Problem Noted Resolved   Obesity affecting pregnancy, antepartum 05/30/2017 by Vena Austria, MD No   Pre-existing diabetes mellitus in pregnancy 12/13/2016 by Farrel Conners, CNM No   Overview Addendum 05/30/2017 10:13 AM by Vena Austria, MD    Current Diabetic Medications:  Metformin  Arly.Keller ] Aspirin 81 mg daily after 12 weeks; discontinue after 36 weeks (? A2/B GDM) - metformin BID  For A2/B GDM or higher classes of DM  Diabetes Education and Testing Supplies  Nutrition Counsult  Fetal ECHO after 22-24 weeks   Eye exam for retina evaluation   Baseline EKG  US fetal growth every 4 weeks starting at 28 weeks        05/04/2017 at 32 weeks EFW of 1901g or 4lbs 3oz c/w 55%ile  Twice weekly NST starting at [redacted] weeks gestation  Delivery planning contingent on fetal growth, AFI, glycemic control, and other co-morbidities but at least by 39 weeks  Baseline and surveillance labs (pulled in from [facility], refresh links as needed)  Lab Results  Component Value Date   CREATININE 0.71 03/13/2015   LABPROT 13.7 03/21/2008   No results found for: HGBA1C  Antenatal Testing Class of DM U/S NST/AFI DELIVERY  Diabetes   A1 - good control - O24.410    A2 - good control - O24.419      A2  - poor control or poor compliance - O24.419, E11.65   (Macrosomia or polyhydramnios) **E11.65 is extra code for poor control**    A2/B -  O24.919  and B-C O24.319  Poor control B-C or D-R-F-T - O24.319  or  Type I DM - O24.019  20-38  20-38  20-24-28-32-36   20-24-28-32-35-38//fetal  echo  20-24-27-30-33-36-38//fetal echo  40  32//2 x wk  32//2 x wk   32//2 x wk  28//BPP wkly then 32//2 x wk  40  39  PRN   39  PRN         Previous cesarean delivery affecting pregnancy, antepartum 12/13/2016 by Farrel Conners, CNM No   Diabetes mellitus, type 2 (HCC) 11/10/2016 by Oswaldo Conroy, CNM No   Supervision of high risk pregnancy, antepartum, first trimester 11/10/2016 by Oswaldo Conroy, CNM No   Overview Addendum 06/16/2017 11:01 AM by Vena Austria, MD    Clinic [facility] Prenatal Labs  Dating 6wk6d ultrasound Blood type: A/Positive/-- (11/19 1016)   Genetic Screen 1 Screen: negative Antibody:Negative (11/19 1016)  Anatomic [place] Normal Female Rubella: 6.69 (11/19 1016) Varicella:    GTT N/A DM II RPR: Non Reactive (11/19 1016)   Rhogam N/A HBsAg: Negative (11/19 1016)   TDaP vaccine  04/20/17                     HIV: Non Reactive (11/19 1016)   Baby Food                                [id number]  Contraception BTL Pap: 08/18/2016 NILM  CBB   HgbAA  CS/VBAC Repeat and BTL   Support Person                  Gestational age appropriate obstetric precautions including but not limited to vaginal bleeding, contractions, leaking of fluid and fetal movement were reviewed in detail with the patient.   - BG at goal - NST/AFI in 3 days  Return in about 3 days (around 06/24/2017) for NST/AFI.  Vena Austria, MD, [facility], [facility] 06/21/2017, 9:30 AM

## 2017-06-21 NOTE — Progress Notes (Signed)
ROB Went to L&D 5/24 NST

## 2017-06-24 ENCOUNTER — Telehealth: Payer: Self-pay

## 2017-06-24 ENCOUNTER — Ambulatory Visit (INDEPENDENT_AMBULATORY_CARE_PROVIDER_SITE_OTHER): Payer: 59 | Admitting: Obstetrics & Gynecology

## 2017-06-24 ENCOUNTER — Ambulatory Visit (INDEPENDENT_AMBULATORY_CARE_PROVIDER_SITE_OTHER): Payer: 59

## 2017-06-24 VITALS — BP 120/80 | Wt 229.0 lb

## 2017-06-24 DIAGNOSIS — Z3A38 38 weeks gestation of pregnancy: Secondary | ICD-10-CM | POA: Diagnosis not present

## 2017-06-24 DIAGNOSIS — O24313 Unspecified pre-existing diabetes mellitus in pregnancy, third trimester: Secondary | ICD-10-CM | POA: Diagnosis not present

## 2017-06-24 DIAGNOSIS — O99213 Obesity complicating pregnancy, third trimester: Secondary | ICD-10-CM | POA: Diagnosis not present

## 2017-06-24 DIAGNOSIS — O0993 Supervision of high risk pregnancy, unspecified, third trimester: Secondary | ICD-10-CM

## 2017-06-24 DIAGNOSIS — O34219 Maternal care for unspecified type scar from previous cesarean delivery: Secondary | ICD-10-CM

## 2017-06-24 NOTE — Progress Notes (Signed)
  Subjective  Fetal Movement? yes Contractions? no Leaking Fluid? no Vaginal Bleeding? no  Objective  BP 120/80   Wt 229 lb (103.9 kg)   LMP 08/30/2016 (Approximate)   BMI 38.11 kg/m  General: NAD Pumonary: no increased work of breathing Abdomen: gravid, non-tender Extremities: no edema Psychiatric: mood appropriate, affect full  Assessment  27 y.o. G2P1001 at [redacted]w[redacted]d by  07/02/2017, by Ultrasound presenting for routine prenatal visit  Plan   Problem List Items Addressed This Visit      Endocrine   Pre-existing diabetes mellitus in pregnancy     Other   Previous cesarean delivery affecting pregnancy, antepartum   Obesity affecting pregnancy, antepartum    Other Visit Diagnoses    [redacted] weeks gestation of pregnancy    -  Primary    A NST procedure was performed with FHR monitoring and a normal baseline established, appropriate time of 20-40 minutes of evaluation, and accels >2 seen w 15x15 characteristics.  Results show a REACTIVE NST.   Review of ULTRASOUND.    I have personally reviewed images and report of recent ultrasound done at [facility].    Plan of management to be discussed with patient. CS next week w BTL Repeat NST Monday  Annamarie Major, MD, [facility], [facility] 06/24/2017  8:33 AM

## 2017-06-24 NOTE — Telephone Encounter (Signed)
Pt states she lost her mucous plug & is inquiring what happens after that. Cb#[phone]

## 2017-06-24 NOTE — Telephone Encounter (Signed)
Spoke w/pt. Advised loss of mucous plug does not signify that labor will be within any certain time. Advised to monitor & report any leaking of fluids, bloody show, ctx 4 in 1 hour or 5 mins apart consistently over to 1 hour.

## 2017-06-27 ENCOUNTER — Ambulatory Visit (INDEPENDENT_AMBULATORY_CARE_PROVIDER_SITE_OTHER): Payer: 59 | Admitting: Obstetrics and Gynecology

## 2017-06-27 VITALS — BP 130/90 | Wt 233.0 lb

## 2017-06-27 DIAGNOSIS — O24313 Unspecified pre-existing diabetes mellitus in pregnancy, third trimester: Secondary | ICD-10-CM | POA: Diagnosis not present

## 2017-06-27 DIAGNOSIS — O99210 Obesity complicating pregnancy, unspecified trimester: Secondary | ICD-10-CM

## 2017-06-27 DIAGNOSIS — Z3A39 39 weeks gestation of pregnancy: Secondary | ICD-10-CM

## 2017-06-27 DIAGNOSIS — O34219 Maternal care for unspecified type scar from previous cesarean delivery: Secondary | ICD-10-CM

## 2017-06-27 DIAGNOSIS — O99213 Obesity complicating pregnancy, third trimester: Secondary | ICD-10-CM

## 2017-06-27 DIAGNOSIS — O0993 Supervision of high risk pregnancy, unspecified, third trimester: Secondary | ICD-10-CM

## 2017-06-27 DIAGNOSIS — O0991 Supervision of high risk pregnancy, unspecified, first trimester: Secondary | ICD-10-CM

## 2017-06-27 NOTE — Progress Notes (Signed)
Routine Prenatal Care Visit  Subjective  Cheryl Carpenter is a 27 y.o. G2P1001 at [redacted]w[redacted]d being seen today for ongoing prenatal care.  She is currently monitored for the following issues for this high-risk pregnancy and has ALLERGIC RHINITIS; Diabetes mellitus, type 2 (HCC); Supervision of high risk pregnancy, antepartum, first trimester; Pre-existing diabetes mellitus in pregnancy; Previous cesarean delivery affecting pregnancy, antepartum; and Obesity affecting pregnancy, antepartum on their problem list.  ----------------------------------------------------------------------------------- Patient reports no complaints.   Contractions: Irregular. Vag. Bleeding: None.  Movement: (!) Decreased. Denies leaking of fluid.  ----------------------------------------------------------------------------------- The following portions of the patient's history were reviewed and updated as appropriate: allergies, current medications, past family history, past medical history, past social history, past surgical history and problem list. Problem list updated.   Objective  Blood pressure 130/90, weight 233 lb (105.7 kg), last menstrual period 08/30/2016. Pregravid weight 210 lb (95.3 kg) Total Weight Gain 23 lb (10.4 kg) Urinalysis: Urine Protein: Trace Urine Glucose: Negative  Fetal Status: Fetal Heart Rate (bpm): 145   Movement: (!) Decreased  Presentation: Vertex  General:  Alert, oriented and cooperative. Patient is in no acute distress.  Skin: Skin is warm and dry. No rash noted.   Cardiovascular: Normal heart rate noted  Respiratory: Normal respiratory effort, no problems with respiration noted  Abdomen: Soft, gravid, appropriate for gestational age. Pain/Pressure: Present     Pelvic:  Cervical exam deferred        Extremities: Normal range of motion.     ental Status: Normal mood and affect. Normal behavior. Normal judgment and thought content.   Baseline: 145 Variability:  moderate Accelerations: present Decelerations: absent Tocometry: N/A The patient was monitored for 30 minutes, fetal heart rate tracing was deemed reactive, category I tracing,  CPT [phone]   Assessment   27 y.o. G2P1001 at [redacted]w[redacted]d by  07/02/2017, by Ultrasound presenting for routine prenatal visit  Plan   SECOND Problems (from 11/10/16 to present)    Problem Noted Resolved   Obesity affecting pregnancy, antepartum 05/30/2017 by Vena Austria, MD No   Pre-existing diabetes mellitus in pregnancy 12/13/2016 by Farrel Conners, CNM No   Overview Addendum 05/30/2017 10:13 AM by Vena Austria, MD    Current Diabetic Medications:  Metformin  Arly.Keller ] Aspirin 81 mg daily after 12 weeks; discontinue after 36 weeks (? A2/B GDM) - metformin BID  For A2/B GDM or higher classes of DM [X]  Diabetes Education and Testing Supplies [ ]  Nutrition Counsult [x]  Fetal ECHO after 22-24 weeks  [ ]  Eye exam for retina evaluation  [ ]  Baseline EKG [X]  US fetal growth every 4 weeks starting at 28 weeks        05/04/2017 at 32 weeks EFW of 1901g or 4lbs 3oz c/w 55%ile [X]  Twice weekly NST starting at [redacted] weeks gestation [ ]  Delivery planning contingent on fetal growth, AFI, glycemic control, and other co-morbidities but at least by 39 weeks  Baseline and surveillance labs (pulled in from [facility], refresh links as needed)  Lab Results  Component Value Date   CREATININE 0.71 03/13/2015   LABPROT 13.7 03/21/2008   No results found for: HGBA1C  Antenatal Testing Class of DM U/S NST/AFI DELIVERY  Diabetes   A1 - good control - O24.410    A2 - good control - O24.419      A2  - poor control or poor compliance - O24.419, E11.65   (Macrosomia or polyhydramnios) **E11.65 is extra code for poor control**    A2/B -  O24.919  and B-C O24.319  Poor control B-C or D-R-F-T - O24.319  or  Type I DM - O24.019  20-38  20-38  20-24-28-32-36   20-24-28-32-35-38//fetal echo  20-24-27-30-33-36-38//fetal echo   40  32//2 x wk  32//2 x wk   32//2 x wk  28//BPP wkly then 32//2 x wk  40  39  PRN   39  PRN         Previous cesarean delivery affecting pregnancy, antepartum 12/13/2016 by Farrel ConnersGutierrez, Colleen, CNM No   Diabetes mellitus, type 2 (HCC) 11/10/2016 by Oswaldo ConroySchmid, Jacelyn Y, CNM No   Supervision of high risk pregnancy, antepartum, first trimester 11/10/2016 by Oswaldo ConroySchmid, Jacelyn Y, CNM No   Overview Addendum 06/16/2017 11:01 AM by Vena AustriaStaebler, Duwan Adrian, MD    Clinic [facility] Prenatal Labs  Dating 6wk6d ultrasound Blood type: A/Positive/-- (11/19 1016)   Genetic Screen 1 Screen: negative Antibody:Negative (11/19 1016)  Anatomic US Normal Female Rubella: 6.69 (11/19 1016) Varicella:    GTT N/A DM II RPR: Non Reactive (11/19 1016)   Rhogam N/A HBsAg: Negative (11/19 1016)   TDaP vaccine  04/20/17                     HIV: Non Reactive (11/19 1016)   Baby Food                                [id number]:negative  Contraception BTL Pap: 08/18/2016 NILM  CBB   HgbAA  CS/VBAC Repeat and BTL   Support Person                  Gestational age appropriate obstetric precautions including but not limited to vaginal bleeding, contractions, leaking of fluid and fetal movement were reviewed in detail with the patient.   - reactive NST today   Return in about 2 days (around 06/29/2017) for preop harris (should be scheduled).  Vena AustriaAndreas Kasey Ewings, MD, Evern CoreFACOG [facility], [facility] 06/27/2017, 9:21 AM

## 2017-06-28 ENCOUNTER — Encounter
Admission: EM | Disposition: A | Discharge status: Home / Self Care | Payer: Self-pay | Source: Home / Self Care | Attending: Obstetrics & Gynecology

## 2017-06-28 ENCOUNTER — Telehealth: Payer: Self-pay

## 2017-06-28 ENCOUNTER — Inpatient Hospital Stay
Admission: EM | Admit: 2017-06-28 | Discharge: 2017-07-01 | DRG: 783 | Disposition: A | Discharge status: Home / Self Care | Payer: 59 | Attending: Obstetrics & Gynecology | Admitting: Obstetrics & Gynecology

## 2017-06-28 ENCOUNTER — Inpatient Hospital Stay: Admission: RE | Admit: 2017-06-28 | Payer: 59 | Source: Ambulatory Visit | Admitting: Obstetrics and Gynecology

## 2017-06-28 ENCOUNTER — Ambulatory Visit (INDEPENDENT_AMBULATORY_CARE_PROVIDER_SITE_OTHER): Payer: 59 | Admitting: Obstetrics & Gynecology

## 2017-06-28 ENCOUNTER — Other Ambulatory Visit: Payer: Self-pay

## 2017-06-28 ENCOUNTER — Inpatient Hospital Stay: Payer: 59 | Admitting: Anesthesiology

## 2017-06-28 VITALS — BP 120/80 | Wt 231.0 lb

## 2017-06-28 DIAGNOSIS — O9081 Anemia of the puerperium: Secondary | ICD-10-CM | POA: Diagnosis present

## 2017-06-28 DIAGNOSIS — E119 Type 2 diabetes mellitus without complications: Secondary | ICD-10-CM | POA: Diagnosis present

## 2017-06-28 DIAGNOSIS — Z302 Encounter for sterilization: Secondary | ICD-10-CM

## 2017-06-28 DIAGNOSIS — O0991 Supervision of high risk pregnancy, unspecified, first trimester: Secondary | ICD-10-CM

## 2017-06-28 DIAGNOSIS — E669 Obesity, unspecified: Secondary | ICD-10-CM | POA: Diagnosis present

## 2017-06-28 DIAGNOSIS — Z3A39 39 weeks gestation of pregnancy: Secondary | ICD-10-CM

## 2017-06-28 DIAGNOSIS — O4292 Full-term premature rupture of membranes, unspecified as to length of time between rupture and onset of labor: Secondary | ICD-10-CM

## 2017-06-28 DIAGNOSIS — O2412 Pre-existing diabetes mellitus, type 2, in childbirth: Secondary | ICD-10-CM | POA: Diagnosis present

## 2017-06-28 DIAGNOSIS — O34211 Maternal care for low transverse scar from previous cesarean delivery: Secondary | ICD-10-CM | POA: Diagnosis not present

## 2017-06-28 DIAGNOSIS — Z7984 Long term (current) use of oral hypoglycemic drugs: Secondary | ICD-10-CM

## 2017-06-28 DIAGNOSIS — O99210 Obesity complicating pregnancy, unspecified trimester: Secondary | ICD-10-CM

## 2017-06-28 DIAGNOSIS — O24913 Unspecified diabetes mellitus in pregnancy, third trimester: Secondary | ICD-10-CM | POA: Diagnosis not present

## 2017-06-28 DIAGNOSIS — Z7982 Long term (current) use of aspirin: Secondary | ICD-10-CM

## 2017-06-28 DIAGNOSIS — O99214 Obesity complicating childbirth: Secondary | ICD-10-CM | POA: Diagnosis present

## 2017-06-28 DIAGNOSIS — D62 Acute posthemorrhagic anemia: Secondary | ICD-10-CM | POA: Diagnosis not present

## 2017-06-28 DIAGNOSIS — O24313 Unspecified pre-existing diabetes mellitus in pregnancy, third trimester: Secondary | ICD-10-CM

## 2017-06-28 DIAGNOSIS — O34219 Maternal care for unspecified type scar from previous cesarean delivery: Secondary | ICD-10-CM

## 2017-06-28 LAB — CBC
HCT: 38.8 % (ref 35.0–47.0)
HCT: 33.7 % — ABNORMAL LOW (ref 35.0–47.0)
Hemoglobin: 13.2 g/dL (ref 12.0–16.0)
Hemoglobin: 11.3 g/dL — ABNORMAL LOW (ref 12.0–16.0)
MCH: 29 pg (ref 26.0–34.0)
MCH: 28.9 pg (ref 26.0–34.0)
MCHC: 34.1 g/dL (ref 32.0–36.0)
MCHC: 33.5 g/dL (ref 32.0–36.0)
MCV: 85.2 fL (ref 80.0–100.0)
MCV: 86.1 fL (ref 80.0–100.0)
Platelets: 204 10*3/uL (ref 150–440)
Platelets: 159 10*3/uL (ref 150–440)
RBC: 4.55 MIL/uL (ref 3.80–5.20)
RBC: 3.91 MIL/uL (ref 3.80–5.20)
RDW: 16.2 % — ABNORMAL HIGH (ref 11.5–14.5)
RDW: 15.7 % — ABNORMAL HIGH (ref 11.5–14.5)
WBC: 12.8 10*3/uL — ABNORMAL HIGH (ref 3.6–11.0)
WBC: 13.1 10*3/uL — ABNORMAL HIGH (ref 3.6–11.0)

## 2017-06-28 LAB — TYPE AND SCREEN
ABO/RH(D): A POS
Antibody Screen: NEGATIVE

## 2017-06-28 LAB — GLUCOSE, CAPILLARY
Glucose-Capillary: 62 mg/dL — ABNORMAL LOW (ref 65–99)
Glucose-Capillary: 136 mg/dL — ABNORMAL HIGH (ref 65–99)
Glucose-Capillary: 100 mg/dL — ABNORMAL HIGH (ref 65–99)

## 2017-06-28 LAB — PROTIME-INR
INR: 1.15
Prothrombin Time: 14.6 seconds (ref 11.4–15.2)

## 2017-06-28 LAB — FIBRINOGEN: Fibrinogen: 389 mg/dL (ref 210–475)

## 2017-06-28 LAB — APTT: aPTT: 28 seconds (ref 24–36)

## 2017-06-28 SURGERY — Surgical Case
Anesthesia: Spinal | Site: Abdomen | Wound class: Clean Contaminated

## 2017-06-28 MED ORDER — MISOPROSTOL 200 MCG PO TABS
800.0000 ug | ORAL_TABLET | Freq: Once | ORAL | Status: AC
  Administered 2017-06-28: 800 ug via VAGINAL

## 2017-06-28 MED ORDER — DEXTROSE IN LACTATED RINGERS 5 % IV SOLN
INTRAVENOUS | Status: DC | PRN
  Administered 2017-06-28: 20:00:00 via INTRAVENOUS

## 2017-06-28 MED ORDER — PROPOFOL 10 MG/ML IV BOLUS
INTRAVENOUS | Status: AC
  Filled 2017-06-28: qty 20

## 2017-06-28 MED ORDER — DIPHENHYDRAMINE HCL 25 MG PO CAPS: 25.0000 mg | ORAL_CAPSULE | Freq: Four times a day (QID) | ORAL | Status: DC | PRN

## 2017-06-28 MED ORDER — OXYTOCIN 40 UNITS IN LACTATED RINGERS INFUSION - SIMPLE MED
INTRAVENOUS | Status: AC
  Filled 2017-06-28: qty 1000

## 2017-06-28 MED ORDER — BUPIVACAINE HCL (PF) 0.5 % IJ SOLN
INTRAMUSCULAR | Status: DC | PRN
  Administered 2017-06-28: 10 mL

## 2017-06-28 MED ORDER — SUCCINYLCHOLINE CHLORIDE 20 MG/ML IJ SOLN
INTRAMUSCULAR | Status: AC
  Filled 2017-06-28: qty 1

## 2017-06-28 MED ORDER — MEPERIDINE HCL 25 MG/ML IJ SOLN: 6.2500 mg | INTRAMUSCULAR | Status: DC | PRN

## 2017-06-28 MED ORDER — BUPIVACAINE HCL (PF) 0.5 % IJ SOLN
INTRAMUSCULAR | Status: AC
Start: 2017-06-28 — End: 2017-06-29
  Filled 2017-06-28: qty 30

## 2017-06-28 MED ORDER — OXYCODONE-ACETAMINOPHEN 5-325 MG PO TABS
2.0000 | ORAL_TABLET | ORAL | Status: DC | PRN
  Administered 2017-06-30 (×2): 2 via ORAL
  Filled 2017-06-28 (×2): qty 2

## 2017-06-28 MED ORDER — LACTATED RINGERS IV SOLN
INTRAVENOUS | Status: DC
  Administered 2017-06-28 (×2): via INTRAVENOUS

## 2017-06-28 MED ORDER — CEFAZOLIN SODIUM-DEXTROSE 2-4 GM/100ML-% IV SOLN
2.0000 g | INTRAVENOUS | Status: AC
  Administered 2017-06-28: 2 g via INTRAVENOUS
  Filled 2017-06-28: qty 100

## 2017-06-28 MED ORDER — SODIUM CHLORIDE 0.9% FLUSH: 3.0000 mL | INTRAVENOUS | Status: DC | PRN

## 2017-06-28 MED ORDER — PHENYLEPHRINE HCL 10 MG/ML IJ SOLN
INTRAMUSCULAR | Status: DC | PRN
  Administered 2017-06-28: 100 ug via INTRAVENOUS

## 2017-06-28 MED ORDER — COCONUT OIL OIL: 1.0000 "application " | TOPICAL_OIL | Status: DC | PRN

## 2017-06-28 MED ORDER — DIPHENHYDRAMINE HCL 50 MG/ML IJ SOLN: 12.5000 mg | INTRAMUSCULAR | Status: DC | PRN

## 2017-06-28 MED ORDER — OXYTOCIN 40 UNITS IN LACTATED RINGERS INFUSION - SIMPLE MED
2.5000 [IU]/h | INTRAVENOUS | Status: AC
  Administered 2017-06-28 – 2017-06-29 (×2): 2.5 [IU]/h via INTRAVENOUS
  Filled 2017-06-28: qty 1000

## 2017-06-28 MED ORDER — KETOROLAC TROMETHAMINE 30 MG/ML IJ SOLN
30.0000 mg | Freq: Four times a day (QID) | INTRAMUSCULAR | Status: AC | PRN
  Administered 2017-06-29 (×3): 30 mg via INTRAVENOUS
  Filled 2017-06-28 (×3): qty 1

## 2017-06-28 MED ORDER — SODIUM CHLORIDE 0.9 % IV SOLN
INTRAVENOUS | Status: DC | PRN
  Administered 2017-06-28: 30 ug/min via INTRAVENOUS

## 2017-06-28 MED ORDER — SOD CITRATE-CITRIC ACID 500-334 MG/5ML PO SOLN
30.0000 mL | ORAL | Status: AC
  Administered 2017-06-28: 30 mL via ORAL
  Filled 2017-06-28: qty 15

## 2017-06-28 MED ORDER — BUPIVACAINE IN DEXTROSE 0.75-8.25 % IT SOLN
INTRATHECAL | Status: DC | PRN
  Administered 2017-06-28: 1.7 mL via INTRATHECAL

## 2017-06-28 MED ORDER — SIMETHICONE 80 MG PO CHEW
80.0000 mg | CHEWABLE_TABLET | Freq: Three times a day (TID) | ORAL | Status: DC
  Administered 2017-06-29 – 2017-07-01 (×5): 80 mg via ORAL
  Filled 2017-06-28 (×6): qty 1

## 2017-06-28 MED ORDER — METFORMIN HCL 500 MG PO TABS
500.0000 mg | ORAL_TABLET | Freq: Every day | ORAL | Status: DC
  Administered 2017-06-29 – 2017-06-30 (×2): 500 mg via ORAL
  Filled 2017-06-28 (×3): qty 1

## 2017-06-28 MED ORDER — KETOROLAC TROMETHAMINE 30 MG/ML IJ SOLN: 30.0000 mg | Freq: Four times a day (QID) | INTRAMUSCULAR | Status: AC | PRN

## 2017-06-28 MED ORDER — DIPHENHYDRAMINE HCL 25 MG PO CAPS: 25.0000 mg | ORAL_CAPSULE | ORAL | Status: DC | PRN

## 2017-06-28 MED ORDER — OXYCODONE-ACETAMINOPHEN 5-325 MG PO TABS
1.0000 | ORAL_TABLET | ORAL | Status: DC | PRN
  Administered 2017-06-30 – 2017-07-01 (×2): 1 via ORAL
  Filled 2017-06-28 (×2): qty 1

## 2017-06-28 MED ORDER — METHYLERGONOVINE MALEATE 0.2 MG/ML IJ SOLN: 0.2000 mg | Freq: Once | INTRAMUSCULAR | Status: DC

## 2017-06-28 MED ORDER — IBUPROFEN 600 MG PO TABS
600.0000 mg | ORAL_TABLET | Freq: Four times a day (QID) | ORAL | Status: DC | PRN
  Administered 2017-06-30 (×3): 600 mg via ORAL
  Filled 2017-06-28 (×3): qty 1

## 2017-06-28 MED ORDER — MORPHINE SULFATE (PF) 0.5 MG/ML IJ SOLN
INTRAMUSCULAR | Status: DC | PRN
  Administered 2017-06-28: .2 mg via EPIDURAL

## 2017-06-28 MED ORDER — PROMETHAZINE HCL 25 MG/ML IJ SOLN
INTRAMUSCULAR | Status: AC
  Filled 2017-06-28: qty 1

## 2017-06-28 MED ORDER — ONDANSETRON HCL 4 MG/2ML IJ SOLN
INTRAMUSCULAR | Status: DC | PRN
  Administered 2017-06-28: 4 mg
  Administered 2017-06-28: 4 mg via INTRAVENOUS

## 2017-06-28 MED ORDER — FAMOTIDINE IN NACL 20-0.9 MG/50ML-% IV SOLN
20.0000 mg | Freq: Two times a day (BID) | INTRAVENOUS | Status: DC
  Administered 2017-06-28: 20 mg via INTRAVENOUS
  Filled 2017-06-28 (×3): qty 50

## 2017-06-28 MED ORDER — PROMETHAZINE HCL 25 MG/ML IJ SOLN
6.2500 mg | Freq: Four times a day (QID) | INTRAMUSCULAR | Status: DC | PRN
  Administered 2017-06-28: 6.25 mg via INTRAVENOUS

## 2017-06-28 MED ORDER — SIMETHICONE 80 MG PO CHEW: 80.0000 mg | CHEWABLE_TABLET | ORAL | Status: DC | PRN

## 2017-06-28 MED ORDER — METHYLERGONOVINE MALEATE 0.2 MG/ML IJ SOLN
INTRAMUSCULAR | Status: AC
  Administered 2017-06-28: 0.2 mg
  Filled 2017-06-28: qty 1

## 2017-06-28 MED ORDER — BUPIVACAINE 0.25 % ON-Q PUMP DUAL CATH 400 ML
400.0000 mL | INJECTION | Status: DC
  Filled 2017-06-28: qty 400

## 2017-06-28 MED ORDER — ACETAMINOPHEN 325 MG PO TABS
650.0000 mg | ORAL_TABLET | ORAL | Status: DC | PRN
  Administered 2017-06-29 – 2017-06-30 (×3): 650 mg via ORAL
  Filled 2017-06-28 (×4): qty 2

## 2017-06-28 MED ORDER — ONDANSETRON HCL 4 MG/2ML IJ SOLN
INTRAMUSCULAR | Status: AC
  Filled 2017-06-28: qty 2

## 2017-06-28 MED ORDER — ONDANSETRON HCL 4 MG/2ML IJ SOLN: 4.0000 mg | Freq: Once | INTRAMUSCULAR | Status: DC | PRN

## 2017-06-28 MED ORDER — MENTHOL 3 MG MT LOZG
1.0000 | LOZENGE | OROMUCOSAL | Status: DC | PRN
  Filled 2017-06-28: qty 9

## 2017-06-28 MED ORDER — LACTATED RINGERS IV SOLN
INTRAVENOUS | Status: DC
  Administered 2017-06-29 (×2): via INTRAVENOUS

## 2017-06-28 MED ORDER — MORPHINE SULFATE (PF) 0.5 MG/ML IJ SOLN
INTRAMUSCULAR | Status: AC
  Filled 2017-06-28: qty 10

## 2017-06-28 MED ORDER — NALOXONE HCL 0.4 MG/ML IJ SOLN: 0.4000 mg | INTRAMUSCULAR | Status: DC | PRN

## 2017-06-28 MED ORDER — METHYLERGONOVINE MALEATE 0.2 MG/ML IJ SOLN: 0.2000 mg | Freq: Once | INTRAMUSCULAR | Status: AC

## 2017-06-28 MED ORDER — BUPIVACAINE HCL (PF) 0.5 % IJ SOLN: 20.0000 mL | INTRAMUSCULAR | Status: DC

## 2017-06-28 MED ORDER — ONDANSETRON HCL 4 MG/2ML IJ SOLN: 4.0000 mg | Freq: Three times a day (TID) | INTRAMUSCULAR | Status: DC | PRN

## 2017-06-28 MED ORDER — ACETAMINOPHEN 500 MG PO TABS
1000.0000 mg | ORAL_TABLET | Freq: Four times a day (QID) | ORAL | Status: AC
  Administered 2017-06-29 (×2): 1000 mg via ORAL
  Filled 2017-06-28 (×2): qty 2

## 2017-06-28 MED ORDER — SIMETHICONE 80 MG PO CHEW: 80.0000 mg | CHEWABLE_TABLET | ORAL | Status: DC

## 2017-06-28 MED ORDER — NALBUPHINE HCL 10 MG/ML IJ SOLN: 5.0000 mg | INTRAMUSCULAR | Status: DC | PRN

## 2017-06-28 MED ORDER — OXYTOCIN 40 UNITS IN LACTATED RINGERS INFUSION - SIMPLE MED
INTRAVENOUS | Status: DC | PRN
  Administered 2017-06-28: 1 mL via INTRAVENOUS
  Administered 2017-06-28: 400 mL via INTRAVENOUS

## 2017-06-28 MED ORDER — SIMETHICONE 80 MG PO CHEW
80.0000 mg | CHEWABLE_TABLET | ORAL | Status: DC
  Filled 2017-06-28: qty 1

## 2017-06-28 MED ORDER — MISOPROSTOL 200 MCG PO TABS
ORAL_TABLET | ORAL | Status: AC
  Filled 2017-06-28: qty 4

## 2017-06-28 MED ORDER — LACTATED RINGERS IV BOLUS
1000.0000 mL | Freq: Once | INTRAVENOUS | Status: AC
  Administered 2017-06-28: 1000 mL via INTRAVENOUS

## 2017-06-28 MED ORDER — BUPIVACAINE ON-Q PAIN PUMP (FOR ORDER SET NO CHG): INJECTION | Status: DC

## 2017-06-28 MED ORDER — FENTANYL CITRATE (PF) 100 MCG/2ML IJ SOLN: 25.0000 ug | INTRAMUSCULAR | Status: DC | PRN

## 2017-06-28 SURGICAL SUPPLY — 30 items
BAG COUNTER SPONGE EZ (MISCELLANEOUS) ×4 IMPLANT
CANISTER SUCT 3000ML PPV (MISCELLANEOUS) ×2 IMPLANT
CATH KIT ON-Q SILVERSOAK 5IN (CATHETERS) ×4 IMPLANT
CHLORAPREP W/TINT 26ML (MISCELLANEOUS) ×4 IMPLANT
DERMABOND ADVANCED (GAUZE/BANDAGES/DRESSINGS) ×1
DERMABOND ADVANCED .7 DNX12 (GAUZE/BANDAGES/DRESSINGS) ×1 IMPLANT
DRSG OPSITE POSTOP 4X10 (GAUZE/BANDAGES/DRESSINGS) ×2 IMPLANT
DRSG TELFA 3X8 NADH (GAUZE/BANDAGES/DRESSINGS) IMPLANT
ELECT CAUTERY BLADE 6.4 (BLADE) ×2 IMPLANT
ELECT REM PT RETURN 9FT ADLT (ELECTROSURGICAL) ×2
ELECTRODE REM PT RTRN 9FT ADLT (ELECTROSURGICAL) ×1 IMPLANT
EXTRACTOR VACUUM KIWI (MISCELLANEOUS) ×2 IMPLANT
GAUZE SPONGE 4X4 12PLY STRL (GAUZE/BANDAGES/DRESSINGS) IMPLANT
GLOVE BIO SURGEON STRL SZ7 (GLOVE) ×6 IMPLANT
GLOVE INDICATOR 7.5 STRL GRN (GLOVE) ×6 IMPLANT
GOWN STRL REUS W/ TWL LRG LVL3 (GOWN DISPOSABLE) ×3 IMPLANT
GOWN STRL REUS W/TWL LRG LVL3 (GOWN DISPOSABLE) ×3
NS IRRIG 1000ML POUR BTL (IV SOLUTION) ×2 IMPLANT
PACK C SECTION AR (MISCELLANEOUS) ×2 IMPLANT
PAD OB MATERNITY 4.3X12.25 (PERSONAL CARE ITEMS) ×2 IMPLANT
PAD PREP 24X41 OB/GYN DISP (PERSONAL CARE ITEMS) ×2 IMPLANT
RETRACTOR TRAXI PANNICULUS (MISCELLANEOUS) ×1 IMPLANT
SPONGE LAP 18X18 5 PK (GAUZE/BANDAGES/DRESSINGS) ×2 IMPLANT
STRIP CLOSURE SKIN 1/2X4 (GAUZE/BANDAGES/DRESSINGS) IMPLANT
SUT MNCRL AB 4-0 PS2 18 (SUTURE) ×2 IMPLANT
SUT PDS AB 1 TP1 96 (SUTURE) ×4 IMPLANT
SUT VIC AB 0 CTX 36 (SUTURE) ×2
SUT VIC AB 0 CTX36XBRD ANBCTRL (SUTURE) ×2 IMPLANT
SUT VIC AB 2-0 CT1 36 (SUTURE) ×2 IMPLANT
TRAXI PANNICULUS RETRACTOR (MISCELLANEOUS) ×1

## 2017-06-28 NOTE — Progress Notes (Signed)
  Subjective  Fetal Movement? yes Contractions? no Leaking Fluid? Yes since 0200 today, no odor, fever, pain Vaginal Bleeding? no  Objective  BP 120/80   Wt 231 lb (104.8 kg)   LMP 08/30/2016 (Approximate)   BMI 38.44 kg/m  General: NAD Pumonary: no increased work of breathing Abdomen: gravid, non-tender Extremities: no edema Psychiatric: mood appropriate, affect full SSE- small amount of pooling    Nitrazine POS    FERNING POS Assessment  27 y.o. G2P1001 at 5927w3d by  07/02/2017, by Ultrasound presenting for routine prenatal visit  Plan   Problem List Items Addressed This Visit    None    Visit Diagnoses    [redacted] weeks gestation of pregnancy    -  Primary   Full-term premature rupture of membranes, unspecified duration to onset of labor        CS today (repeat) for PPROM BTL planned  Annamarie MajorPaul Caeden Foots, MD, Merlinda FrederickFACOG [facility], [facility] 06/28/2017  11:24 AM

## 2017-06-28 NOTE — Anesthesia Preprocedure Evaluation (Signed)
Anesthesia Evaluation  Patient identified by MRN, date of birth, ID band Patient awake    Reviewed: Allergy & Precautions, NPO status , Patient's Chart, lab work & pertinent test results, reviewed documented beta blocker date and time   Airway Mallampati: III  TM Distance: >3 FB     Dental  (+) Chipped   Pulmonary           Cardiovascular      Neuro/Psych    GI/Hepatic   Endo/Other  diabetes, Type 2  Renal/GU      Musculoskeletal   Abdominal   Peds  Hematology   Anesthesia Other Findings Obese.  Reproductive/Obstetrics                             Anesthesia Physical Anesthesia Plan  ASA: III  Anesthesia Plan: Spinal   Post-op Pain Management:    Induction:   PONV Risk Score and Plan:   Airway Management Planned:   Additional Equipment:   Intra-op Plan:   Post-operative Plan:   Informed Consent: I have reviewed the patients History and Physical, chart, labs and discussed the procedure including the risks, benefits and alternatives for the proposed anesthesia with the patient or authorized representative who has indicated his/her understanding and acceptance.     Plan Discussed with: CRNA  Anesthesia Plan Comments:         Anesthesia Quick Evaluation

## 2017-06-28 NOTE — Progress Notes (Signed)
Obstetric and Gynecology  Subjective  Patient with continued oozing and passage of small clots postoperatively, came to evaluate patient.  Continued ozzing after intial 0.2mg  of IM methergine  Objective   Vitals:   06/28/17 2120 06/28/17 2125  BP:  101/67  Pulse: 95 92  Resp: 10 (!) 25  Temp:  (!) 97.1 F (36.2 C)  SpO2: 100% 98%     Intake/Output Summary (Last 24 hours) at 06/28/2017 2142 Last data filed at 06/28/2017 2115 Gross per 24 hour  Intake 1583.33 ml  Output 900 ml  Net 683.33 ml    General: NAD Cardiovascular:  Regular rate Abdomen: soft, non-distended, fundus firm 1cm above umbilicus GU: cervix 4cm dilated, bakri ballon placed inflated to with of NS Extremities: SCD's in place  Labs: Results for orders placed or performed during the hospital encounter of 06/28/17 (from the past 24 hour(s))  CBC     Status: Abnormal   Collection Time: 06/28/17 12:24 PM  Result Value Ref Range   WBC 12.8 (H) 3.6 - 11.0 K/uL   RBC 4.55 3.80 - 5.20 MIL/uL   Hemoglobin 13.2 12.0 - 16.0 g/dL   HCT 82.9 56.2 - 13.0 %   MCV 85.2 80.0 - 100.0 fL   MCH 29.0 26.0 - 34.0 pg   MCHC 34.1 32.0 - 36.0 g/dL   RDW 86.5 (H) 78.4 - 69.6 %   Platelets 204 150 - 440 K/uL  Type and screen     Status: None   Collection Time: 06/28/17  1:15 PM  Result Value Ref Range   ABO/RH(D) A POS    Antibody Screen NEG    Sample Expiration      07/01/2017 Performed at [facility], [address] [place], [place] [address]   Glucose, capillary     Status: Abnormal   Collection Time: 06/28/17  7:17 PM  Result Value Ref Range   Glucose-Capillary 62 (L) 65 - 99 mg/dL  Glucose, capillary     Status: Abnormal   Collection Time: 06/28/17  9:09 PM  Result Value Ref Range   Glucose-Capillary 136 (H) 65 - 99 mg/dL    Cultures: Results for orders placed or performed in visit on 06/02/17  Culture, beta strep (group b only)     Status: None   Collection Time: 06/02/17  2:51 PM  Result  Value Ref Range Status   Strep Gp B Culture Negative Negative Final    Comment: Centers for Disease Control and Prevention (CDC) and American Congress of Obstetricians and Gynecologists (ACOG) guidelines for prevention of perinatal group B streptococcal (GBS) disease specify co-collection of a vaginal and rectal swab specimen to maximize sensitivity of GBS detection. Per the CDC and ACOG, swabbing both the lower vagina and rectum substantially increases the yield of detection compared with sampling the vagina alone. Penicillin G, ampicillin, or cefazolin are indicated for intrapartum prophylaxis of perinatal GBS colonization. Reflex susceptibility testing should be performed prior to use of clindamycin only on GBS isolates from penicillin-allergic women who are considered a high risk for anaphylaxis. Treatment with vancomycin without additional testing is warranted if resistance to clindamycin is noted.   GC/Chlamydia Probe Amp     Status: None   Collection Time: 06/02/17  2:51 PM  Result Value Ref Range Status   Chlamydia trachomatis, NAA Negative Negative Final   Neisseria gonorrhoeae by PCR Negative Negative Final    Imaging:  Assessment   27 y.o. G2P1001 POD1 RLTCS & BTL with PPH  Plan  1) PPH - s/p methergine 0.2mg  IM, of cytotec rectally, bakari placed while patient still has spinal effects.  Low concern for retained products given clean uterine sweep at time of C-section.   - monitor vitals and UOP - monitor bakri output - CBC, PT, PTT ordered

## 2017-06-28 NOTE — Anesthesia Procedure Notes (Signed)
Spinal  Patient location during procedure: OR Staffing Anesthesiologist: Krystalyn Kubota, MD Performed: anesthesiologist  Preanesthetic Checklist Completed: patient identified, site marked, surgical consent, pre-op evaluation, timeout performed, IV checked and risks and benefits discussed Spinal Block Patient position: sitting Prep: ChloraPrep Patient monitoring: heart rate, cardiac monitor, continuous pulse ox and blood pressure Approach: midline Location: L3-4 Injection technique: single-shot Needle Needle type: Pencil-Tip  Needle gauge: 25 G Needle length: 9 cm Assessment Sensory level: T10     

## 2017-06-28 NOTE — H&P (Signed)
Obstetric H&P   Chief Complaint: Prelabor rupture of membranes  Prenatal Care Provider: [facility]  History of Present Illness: 27 y.o. G2P1001 [redacted]w[redacted]d by 07/02/2017, by 6 week Ultrasound presenting to L&D with prelabor rupture of membranes in the setting of prior cesarean section.  Presents to clinic today with clear LOF.  The patient pregnancy has been complicated by pre-existing type II DM with good glucose control on metformin.   Last growth ultrasound 06/02/2017 at 36 weeks 6lbs 4oz c/w 54%ile.  +FM, irregular contractions, no vaginal bleeding.    Pregravid weight 210 lb (95.3 kg) Total Weight Gain 21 lb (9.526 kg)  SECOND Problems (from 11/10/16 to present)    Problem Noted Resolved   Obesity affecting pregnancy, antepartum 05/30/2017 by Vena Austria, MD No   Pre-existing diabetes mellitus in pregnancy 12/13/2016 by Farrel Conners, CNM No   Overview Addendum 05/30/2017 10:13 AM by Vena Austria, MD    Current Diabetic Medications:  Metformin  Arly.Keller ] Aspirin 81 mg daily after 12 weeks; discontinue after 36 weeks (? A2/B GDM) - metformin BID  For A2/B GDM or higher classes of DM [X]  Diabetes Education and Testing Supplies [ ]  Nutrition Counsult [x]  Fetal ECHO after 22-24 weeks  [ ]  Eye exam for retina evaluation  [ ]  Baseline EKG [X]  US fetal growth every 4 weeks starting at 28 weeks        05/04/2017 at 32 weeks EFW of 1901g or 4lbs 3oz c/w Cheryl%ile [X]  Twice weekly NST starting at [redacted] weeks gestation [ ]  Delivery planning contingent on fetal growth, AFI, glycemic control, and other co-morbidities but at least by 39 weeks  Baseline and surveillance labs (pulled in from [facility], refresh links as needed)  Lab Results  Component Value Date   CREATININE 0.71 03/13/2015   LABPROT 13.7 03/21/2008   No results found for: HGBA1C  Antenatal Testing Class of DM U/S NST/AFI DELIVERY  Diabetes   A1 - good control - O24.410    A2 - good control - O24.419      A2  - poor control or poor  compliance - O24.419, E11.65   (Macrosomia or polyhydramnios) **E11.65 is extra code for poor control**    A2/B - O24.919  and B-C O24.319  Poor control B-C or D-R-F-T - O24.319  or  Type I DM - O24.019  20-38  20-38  20-24-28-32-36   20-24-28-32-35-38//fetal echo  20-24-27-30-33-36-38//fetal echo  40  32//2 x wk  32//2 x wk   32//2 x wk  28//BPP wkly then 32//2 x wk  40  39  PRN   39  PRN         Previous cesarean delivery affecting pregnancy, antepartum 12/13/2016 by Farrel Conners, CNM No   Diabetes mellitus, type 2 (HCC) 11/10/2016 by Oswaldo Conroy, CNM No   Supervision of high risk pregnancy, antepartum, first trimester 11/10/2016 by Oswaldo Conroy, CNM No   Overview Addendum 06/16/2017 11:01 AM by Vena Austria, MD    Clinic [facility] Prenatal Labs  Dating 6wk6d ultrasound Blood type: A/Positive/-- (11/19 1016)   Genetic Screen 1 Screen: negative Antibody:Negative (11/19 1016)  Anatomic [place] Normal Female Rubella: 6.69 (11/19 1016) Varicella: Immune  GTT N/A DM II RPR: Non Reactive (11/19 1016)   Rhogam N/A HBsAg: Negative (11/19 1016)   TDaP vaccine  04/20/17                     HIV: Non Reactive (11/19 1016)   Baby Food  GNF:[id number]:negative  Contraception BTL Pap: 08/18/2016 NILM  CBB   HgbAA  CS/VBAC Repeat and BTL   Support Person                  Review of Systems: 10 point review of systems negative unless otherwise noted in HPI  Past Medical History: Past Medical History:  Diagnosis Date  . Diabetes mellitus without complication [facility](HCC)     Past Surgical History: Past Surgical History:  Procedure Laterality Date  . CESAREAN SECTION  2010   failed VAVD  . FOOT SURGERY    . nexplanon removal  07/2016    Past Obstetric History: #: 1, Date: 10/27/08, Sex: Female, Weight: 8 lb (3.629 kg), GA: 1367w0d, Delivery: C-Section, Low Transverse, Apgar1: None, Apgar5: None, Living: Living, Birth Comments:  FETAL HEART RATE WENT UP AND STARTED RUNNING FEVER  #: 2, Date: None, Sex: None, Weight: None, GA: None, Delivery: None, Apgar1: None, Apgar5: None, Living: None, Birth Comments: None   Family History: Family History  Problem Relation Age of Onset  . Diabetes Father   . Heart failure Other     Social History: Social History   Socioeconomic History  . Marital status: Significant Other    Spouse name: Not on file  . Number of children: Not on file  . Years of education: Not on file  . Highest education level: Not on file  Occupational History  . Not on file  Social Needs  . Financial resource strain: Not on file  . Food insecurity:    Worry: Not on file    Inability: Not on file  . Transportation needs:    Medical: Not on file    Non-medical: Not on file  Tobacco Use  . Smoking status: Never Smoker  . Smokeless tobacco: Never Used  Substance and Sexual Activity  . Alcohol use: No  . Drug use: No  . Sexual activity: Yes    Birth control/protection: Surgical  Lifestyle  . Physical activity:    Days per week: Not on file    Minutes per session: Not on file  . Stress: Not on file  Relationships  . Social connections:    Talks on phone: Not on file    Gets together: Not on file    Attends religious service: Not on file    Active member of club or organization: Not on file    Attends meetings of clubs or organizations: Not on file    Relationship status: Not on file  . Intimate partner violence:    Fear of current or ex partner: Not on file    Emotionally abused: Not on file    Physically abused: Not on file    Forced sexual activity: Not on file  Other Topics Concern  . Not on file  Social History Narrative  . Not on file    Medications: Prior to Admission medications   Medication Sig Start Date End Date Taking? Authorizing Provider  cetirizine (ZYRTEC) 10 MG tablet Take 10 mg by mouth daily as needed for allergies.    [provider]    diphenhydrAMINE (BENADRYL) 25 MG tablet Take 50 mg by mouth at bedtime as needed for sleep.    [provider]  ibuprofen (ADVIL,MOTRIN) 200 MG tablet Take 800 mg by mouth 2 (two) times daily as needed for headache or moderate pain.    [provider]  metFORMIN (GLUCOPHAGE) 500 MG tablet Take 500 mg by mouth daily with breakfast.  [provider]  Prenatal Vit-Fe Fumarate-FA (MULTIVITAMIN-PRENATAL) 27-0.8 MG TABS tablet Take 1 tablet by mouth daily.     [provider]    Allergies: Allergies  Allergen Reactions  . Fish Allergy Anaphylaxis  . Peanut-Containing Drug Products Anaphylaxis  . Shellfish Allergy Anaphylaxis  . Sulfa Antibiotics Nausea And Vomiting    Physical Exam: Vitals: Last menstrual period 08/30/2016.  FHT: 150, moderate, no accels, on decel unrelated to contraction at the very start of tracing Toco: irregular  General: NAD HEENT: normocephalic, anicteric Pulmonary: No increased work of breathing Cardiovascular: RRR, distal pulses 2+ Abdomen: Gravid, non-tender Extremities: no edema, erythema, or tenderness Neurologic: Grossly intact Psychiatric: mood appropriate, affect full  Labs: No results found for this or any previous visit (from the past 24 hour(s)).  Assessment: 27 y.o. G2P1001 [redacted]w[redacted]d by 07/02/2017, by 6 week Ultrasound presenting with prelabor rupture of membranes  Plan: 1) Prelabor rupture of membranes at term - proceed with repeat Cesarean section and BTL  2) Fetus - cat I tracing  3) PNL - Blood type A/Positive/-- (11/19 1016) / Anti-bodyscreen Negative (11/19 1016) / Rubella 6.69 (11/19 1016) / Varicella Immune / RPR Non Reactive (05/06 1037) / HBsAg Negative (11/19 1016) / HIV Non Reactive (05/06 1037) / 1-hr OGTT N/A / GBS Negative  4) Immunization History -  Immunization History  Administered Date(s) Administered  . Tdap 04/20/2017    5) Disposition - pending delivery  Vena Austria, MD,  [facility], [facility] 06/28/2017, 12:08 PM

## 2017-06-28 NOTE — Op Note (Signed)
Preoperative Diagnosis: 1) 27 y.o. G2P1001 at [redacted]w[redacted]d 2) Obesity Body mass index is 38.61 kg/m. 3) History of prior cesarean section 4) Gestational diabetes 5) Prelabor rupture of membranes 6) Desires permanent surgical sterilization  Postoperative Diagnosis: 1) 27 y.o. [id number] at [redacted]w[redacted]d 2) Obesity Body mass index is 38.61 kg/m. 3) History of prior cesarean section 4) Gestational diabetes 5) Prelabor rupture of membranes 6) Desires permanent surgical sterilization  Operation Performed: Repeat low transverse C-section via pfannenstiel skin incision and tubal ligation  Anesthesia:Spinal  Primary Surgeon: Vena Austria, MD  Assistant:Shelby Street   Preoperative Antibiotics: 2g ancef  Estimated Blood Loss: 800 mL  IV Fluids:  Urine Output::  Drains or Tubes: Foley to gravity drainage, ON-Q catheter system  Implants: none  Specimens Removed: segment of right and left tube  Complications: none  Intraoperative Findings:  Normal tubes ovaries and uterus.  Delivery resulted in the birth of a liveborn female, APGAR (1 MIN):  8 APGAR (5 MINS):  8 , weight 9lbs 2oz (4130g)  Patient Condition: stable  Procedure in Detail:  Patient was taken to the operating room were she was administered regional anesthesia.  She was positioned in the supine position, prepped and draped in the  Usual sterile fashion.  Prior to proceeding with the case a time out was performed and the level of anesthetic was checked and noted to be adequate.  Utilizing the scalpel a pfannenstiel skin incision was made 2cm above the pubic symphysis utilizing the patient's pre-existing scar and carried down sharply to the the level of the rectus fascia.  The fascia was incised in the midline using the scalpel and then extended using mayo scissors.  The superior border of the rectus fascia was grasped with two Kocher clamps and the underlying rectus muscles were dissected of the fascia using blunt  dissection.  The median raphae was incised using a scalpel.   The inferior border of the rectus fascia was dissected of the rectus muscles in a similar fashion, median raphae incised using mayo scissors.  The midline was identified, the peritoneum was entered bluntly and expanded using manual tractions.  The uterus was noted to be in a none rotated position.  Next the bladder blade was placed retracting the bladder caudally.  A bladder flap was not created.  A low transverse incision was scored on the lower uterine segment.  The hysterotomy was entered bluntly using the operators finger.  The hysterotomy incision was extended using manual traction.  The operators hand was placed within the hysterotomy position noting the fetus to be within the OA position.  The vertex was grasped, flexed, brought to the incision, and deliveredusing fundal pressure and a flat kiwi.  The left arm was splinted and delivery to allow delivery of the remainder of the body.  The infant was suctioned, cord was clamped and cut before handing off to the awaiting neonatologist.  The placenta was delivered using manual extraction.  The uterus was exteriorized, wiped clean of clots and debris using two moist laps.  The hysterotomy was closed using a two layer closure of 0 Vicryl, with the first being a running locked, the second a vertical imbricating.  The right tube was grasped in a mid isthmic portion,  There were some adhesions to the right ovary and the tube segment was excised using a Parkland technique with a window created below the tube segment and the distal and proximal segment of the tube being double suture ligated using 0 chromic.  The left tube was then grasped using a Babcock clamp, before being double suture ligated using a 0 chromic wheel.  The intervening nuckel of tube was excised using Metzenbaum scissors.  Complete cross section of tubal ostia visualized bilaterally, and noted to be hemostatic     The uterus was returned  to the abdomen.  The peritoneal gutters were wiped clean of clots and debris using two moist laps.  The hysterotomy incision and tubal pedicles were re-inspected noted to be hemostatic with ties intact.  The rectus muscles were inspected noted to be hemostatic.  The superior border of the rectus fascia was grasped with a Kocher clamp.  The ON-Q trocars were then placed 4cm above the superior border of the incision and tunneled subfascially.  The introducers were removed and the catheters were threaded through the sleeves after which the sleeves were removed.  The fascia was closed using a looped #1 PDS in a running fashion taking 1cm by 1cm bites.  The subcutaneous tissue was irrigated using warm saline, hemostasis achieved using the bovie.  The subcutaneous dead space was less than 3cm and was not closed.  The skin was closed using 4-0 Monocryl in a subcuticular fashion.  Sponge needle and instrument counts were corrects times two.  The patient tolerated the procedure well and was taken to the recovery room in stable condition.

## 2017-06-28 NOTE — Transfer of Care (Signed)
Immediate Anesthesia Transfer of Care Note  Patient: Cheryl Carpenter  Procedure(s) Performed: CESAREAN SECTION, BTL (N/A Abdomen)  Patient Location: PACU  Anesthesia Type:Spinal  Level of Consciousness: awake, alert , oriented and patient cooperative  Airway & Oxygen Therapy: Patient Spontanous Breathing  Post-op Assessment: Report given to RN and Post -op Vital signs reviewed and stable  Post vital signs: Reviewed and stable  Last Vitals:  Vitals Value Taken Time  BP 109/71 06/28/2017  9:12 PM  Temp    Pulse 84 06/28/2017  9:14 PM  Resp 22 06/28/2017  9:14 PM  SpO2 98 % 06/28/2017  9:14 PM  Vitals shown include unvalidated device data.  Last Pain:  Vitals:   06/28/17 1914  TempSrc: Oral  PainSc:          Complications: No apparent anesthesia complications

## 2017-06-28 NOTE — Telephone Encounter (Signed)
Do you want to see her today?

## 2017-06-28 NOTE — Telephone Encounter (Signed)
appt with RPH 11:00 am today.

## 2017-06-28 NOTE — Anesthesia Post-op Follow-up Note (Signed)
Anesthesia QCDR form completed.        

## 2017-06-28 NOTE — Telephone Encounter (Signed)
Pt called this morning with c/o feeling like she is leaking fluid in small gushes since about 3:00 am this morning. Pt states it is not enough to wear a pad and denies any contractions, pain or bleeding. Please [facility] or send to [facility]. [facility] Cb# [id number]. Thank you.

## 2017-06-28 NOTE — Telephone Encounter (Signed)
yes

## 2017-06-29 ENCOUNTER — Encounter: Payer: Self-pay | Admitting: Obstetrics and Gynecology

## 2017-06-29 ENCOUNTER — Encounter: Payer: 59 | Admitting: Obstetrics & Gynecology

## 2017-06-29 ENCOUNTER — Inpatient Hospital Stay: Admission: RE | Admit: 2017-06-29 | Payer: 59 | Source: Ambulatory Visit

## 2017-06-29 LAB — CBC
HCT: 33.8 % — ABNORMAL LOW (ref 35.0–47.0)
Hemoglobin: 11.6 g/dL — ABNORMAL LOW (ref 12.0–16.0)
MCH: 29.3 pg (ref 26.0–34.0)
MCHC: 34.3 g/dL (ref 32.0–36.0)
MCV: 85.5 fL (ref 80.0–100.0)
Platelets: 185 10*3/uL (ref 150–440)
RBC: 3.96 MIL/uL (ref 3.80–5.20)
RDW: 15.8 % — ABNORMAL HIGH (ref 11.5–14.5)
WBC: 18.6 10*3/uL — ABNORMAL HIGH (ref 3.6–11.0)

## 2017-06-29 LAB — URINALYSIS, COMPLETE (UACMP) WITH MICROSCOPIC
Bacteria, UA: NONE SEEN
Bilirubin Urine: NEGATIVE
Glucose, UA: 50 mg/dL — AB
Ketones, ur: 20 mg/dL — AB
Leukocytes, UA: NEGATIVE
Nitrite: NEGATIVE
Protein, ur: 30 mg/dL — AB
Specific Gravity, Urine: 1.026 (ref 1.005–1.030)
pH: 5 (ref 5.0–8.0)

## 2017-06-29 LAB — BASIC METABOLIC PANEL
Anion gap: 8 (ref 5–15)
BUN: 7 mg/dL (ref 6–20)
CO2: 21 mmol/L — ABNORMAL LOW (ref 22–32)
Calcium: 8.6 mg/dL — ABNORMAL LOW (ref 8.9–10.3)
Chloride: 104 mmol/L (ref 101–111)
Creatinine, Ser: 0.48 mg/dL (ref 0.44–1.00)
GFR calc Af Amer: >60 mL/min (ref >60)
GFR calc non Af Amer: >60 mL/min (ref >60)
Glucose, Bld: 112 mg/dL — ABNORMAL HIGH (ref 65–99)
Potassium: 4.3 mmol/L (ref 3.5–5.1)
Sodium: 133 mmol/L — ABNORMAL LOW (ref 135–145)

## 2017-06-29 LAB — GLUCOSE, CAPILLARY
Glucose-Capillary: 67 mg/dL (ref 65–99)
Glucose-Capillary: 67 mg/dL (ref 65–99)

## 2017-06-29 LAB — CREATININE, URINE, RANDOM: Creatinine, Urine: 173 mg/dL

## 2017-06-29 LAB — RPR: RPR Ser Ql: NONREACTIVE

## 2017-06-29 LAB — SODIUM, URINE, RANDOM: Sodium, Ur: 161 mmol/L

## 2017-06-29 MED ORDER — WITCH HAZEL-GLYCERIN EX PADS: 1.0000 "application " | MEDICATED_PAD | CUTANEOUS | Status: DC | PRN

## 2017-06-29 MED ORDER — SENNOSIDES-DOCUSATE SODIUM 8.6-50 MG PO TABS
2.0000 | ORAL_TABLET | ORAL | Status: DC
  Administered 2017-06-29: 2 via ORAL
  Filled 2017-06-29: qty 2

## 2017-06-29 MED ORDER — LACTATED RINGERS IV BOLUS
1000.0000 mL | Freq: Once | INTRAVENOUS | Status: AC
Start: 2017-06-29 — End: 2017-06-29
  Administered 2017-06-29: 1000 mL via INTRAVENOUS

## 2017-06-29 MED ORDER — DIBUCAINE 1 % RE OINT: 1.0000 "application " | TOPICAL_OINTMENT | RECTAL | Status: DC | PRN

## 2017-06-29 MED ORDER — PRENATAL MULTIVITAMIN CH
1.0000 | ORAL_TABLET | Freq: Every day | ORAL | Status: DC
  Administered 2017-06-30: 1 via ORAL
  Filled 2017-06-29: qty 1

## 2017-06-29 MED ORDER — LACTATED RINGERS IV BOLUS
1000.0000 mL | Freq: Once | INTRAVENOUS | Status: AC
  Administered 2017-06-29: 200 mL via INTRAVENOUS

## 2017-06-29 NOTE — Discharge Summary (Addendum)
Obstetric Discharge Summary Reason for Admission: cesarean section, prelabor rupture of membranes Prenatal Procedures: ultrasound, NSTs Intrapartum Procedures: cesarean: low cervical, transverse and tubal ligation on 06/28/2017 Postpartum Procedures:Bakari balloon Complications-Operative and Postpartum: postpartum hemorrhage Hemoglobin  Date Value Ref Range Status  07/01/2017 9.9 (L) 12.0 - 16.0 g/dL Final    Comment:    Performed at [facility], [address] [place], [place] [address]  05/30/2017 12.8 11.1 - 15.9 g/dL Final   HCT  Date Value Ref Range Status  06/30/2017 28.7 (L) 35.0 - 47.0 % Final   Hematocrit  Date Value Ref Range Status  05/30/2017 39.2 34.0 - 46.6 % Final   A POS/RI/VI/ TDAP UTD  Physical Exam:  General: alert, cooperative and no distress . Had an episode of asymptomatic hypoglycemia this AM with CBG 54 after Metformin. CBG did come up to 100 with juice Heart: RRR without murmur Lungs/Chest: normal respiratory effort, CTAB Lochia: appropriate Uterine Fundus: firm at U/ ML/ slightly tender Incision: Honey comb dressing intact. Small amount dried blood on left side of dressing; ON Q pump intact DVT Evaluation: No evidence of DVT seen on physical exam.  Discharge Diagnoses: Term Pregnancy-delivered and PROM, History of prior Cesarean section, desires sterilization, Postpartum hemorrhage, postpartum anemiaT2DM  Discharge Information: Date: 07/01/2017 Activity: pelvic rest and no heavy lifting >10-15lbs x 6 weeks Diet: routine, diabetic diet Allergies as of 07/01/2017      Reactions   Fish Allergy Anaphylaxis   Peanut-containing Drug Products Anaphylaxis   Shellfish Allergy Anaphylaxis   Sulfa Antibiotics Nausea And Vomiting      Medication List    STOP taking these medications   diphenhydrAMINE 25 MG tablet Commonly known as:  BENADRYL     TAKE these medications   cetirizine 10 MG tablet Commonly known as:  ZYRTEC Take 10 mg by mouth  daily as needed for allergies.   ibuprofen 600 MG tablet Commonly known as:  ADVIL,MOTRIN Take 1 tablet (600 mg total) by mouth every 6 (six) hours as needed for mild pain or cramping. What changed:    medication strength  how much to take  when to take this  reasons to take this   metFORMIN 500 MG tablet Commonly known as:  GLUCOPHAGE Take 500 mg by mouth daily with breakfast.   multivitamin-prenatal 27-0.8 MG Tabs tablet Take 1 tablet by mouth daily.   oxyCODONE-acetaminophen 5-325 MG tablet Commonly known as:  PERCOCET/ROXICET Take 1-2 tablets by mouth every 6 (six) hours as needed for up to 5 days for moderate pain or severe pain.      Also take ferrous sulfate 325 mgm daily.   Condition: stable Instructions: see discharge instructions; continue to monitor glucose levels on Metformin Discharge to: home Follow-up Information    Vena AustriaStaebler, Andreas, MD In 1 week.   Specialty:  Obstetrics and Gynecology Why:  For wound re-check Contact information: [address] [place] [address] [phone]           Newborn Data: Live born female / Wilber OliphantCaleb Birth Weight: 9 lb 1.7 oz (4130 g) APGAR: 8, 8  Newborn Delivery   Birth date/time:  06/28/2017 20:03:00 Delivery type:  C-Section, Low Vertical Trial of labor:  No C-section categorization:  Repeat     Home with mother.  Farrel ConnersColleen Ewel Lona 07/01/2017, 10:27 AM

## 2017-06-29 NOTE — Anesthesia Post-op Follow-up Note (Signed)
  Anesthesia Pain Follow-up Note  Patient: Cheryl HoyerBrittany L Leano  Day #: 1  Date of Follow-up: 06/29/2017 Time: 8:08 AM  Last Vitals:  Vitals:   06/29/17 0700 06/29/17 0759  BP:    Pulse:    Resp: 20 18  Temp: 36.8 C   SpO2:      Level of Consciousness: alert  Pain: mild   Side Effects:None  Catheter Site Exam:no drainage     Plan: D/C from anesthesia care at surgeon's request  Rica MastBachich,  Jacqualynn Parco M

## 2017-06-29 NOTE — Anesthesia Postprocedure Evaluation (Signed)
Anesthesia Post Note  Patient: Cheryl Carpenter  Procedure(s) Performed: CESAREAN SECTION, BTL (N/A Abdomen)  Patient location during evaluation: L&D Anesthesia Type: Spinal Level of consciousness: oriented and awake and alert Pain management: pain level controlled Vital Signs Assessment: post-procedure vital signs reviewed and stable Respiratory status: spontaneous breathing, respiratory function stable and patient connected to nasal cannula oxygen Cardiovascular status: blood pressure returned to baseline and stable Postop Assessment: no headache, no backache and no apparent nausea or vomiting Anesthetic complications: no     Last Vitals:  Vitals:   06/29/17 0559 06/29/17 0700  BP: 109/69   Pulse: 85   Resp:  20  Temp:  36.8 C  SpO2:      Last Pain:  Vitals:   06/29/17 0700  TempSrc: Oral  PainSc:                  Rica MastBachich,  Reily Ilic M

## 2017-06-29 NOTE — Progress Notes (Signed)
POD#0 rLTCS with PPH Subjective:  Feels well, resting in bed with SCDs on. Foley output improving. Tolerating a regular diet. Denies pain.  Objective:  Blood pressure 114/75, pulse 87, temperature 98.1 F (36.7 C), resp. rate 16, height '5\' 5"'$  (1.651 m), weight 232 lb (105.2 kg), last menstrual period 08/30/2016, SpO2 99 %, unknown if currently breastfeeding.  General: NAD Pulmonary: no increased work of breathing Abdomen: non-distended, non-tender, fundus firm, lochia appropriate Incision: Dressing is C/D/I Extremities: no edema, no erythema, no tenderness  Results for orders placed or performed during the hospital encounter of 06/28/17 (from the past 72 hour(s))  CBC     Status: Abnormal   Collection Time: 06/28/17 12:24 PM  Result Value Ref Range   WBC 12.8 (H) 3.6 - 11.0 K/uL   RBC 4.55 3.80 - 5.20 MIL/uL   Hemoglobin 13.2 12.0 - 16.0 g/dL   HCT 38.8 35.0 - 47.0 %   MCV 85.2 80.0 - 100.0 fL   MCH 29.0 26.0 - 34.0 pg   MCHC 34.1 32.0 - 36.0 g/dL   RDW 16.2 (H) 11.5 - 14.5 %   Platelets 204 150 - 440 K/uL    Comment: Performed at [facility], [address] [place], [place] [address]  RPR     Status: None   Collection Time: 06/28/17 12:24 PM  Result Value Ref Range   RPR Ser Ql Non Reactive Non Reactive    Comment: (NOTE) Performed At: [facility] [place], [place] [id number] Rush Farmer MD [phone] Performed at [facility], [address] [place], [place] [address]   Type and screen     Status: None   Collection Time: 06/28/17  1:15 PM  Result Value Ref Range   ABO/RH(D) A POS    Antibody Screen NEG    Sample Expiration      07/01/2017 Performed at [facility], [address] [place], [place] [address]   Glucose, capillary     Status: Abnormal   Collection Time: 06/28/17  7:17 PM  Result Value Ref Range   Glucose-Capillary 62 (L) 65 - 99 mg/dL  Glucose, capillary     Status: Abnormal   Collection  Time: 06/28/17  9:09 PM  Result Value Ref Range   Glucose-Capillary 136 (H) 65 - 99 mg/dL  CBC     Status: Abnormal   Collection Time: 06/28/17  9:53 PM  Result Value Ref Range   WBC 13.1 (H) 3.6 - 11.0 K/uL   RBC 3.91 3.80 - 5.20 MIL/uL   Hemoglobin 11.3 (L) 12.0 - 16.0 g/dL   HCT 33.7 (L) 35.0 - 47.0 %   MCV 86.1 80.0 - 100.0 fL   MCH 28.9 26.0 - 34.0 pg   MCHC 33.5 32.0 - 36.0 g/dL   RDW 15.7 (H) 11.5 - 14.5 %   Platelets 159 150 - 440 K/uL    Comment: Performed at [facility], [address] [place], [place] [address]  APTT     Status: None   Collection Time: 06/28/17  9:53 PM  Result Value Ref Range   aPTT 28 24 - 36 seconds    Comment: Performed at [facility], [address] [place],  [address]  Protime-INR     Status: None   Collection Time: 06/28/17  9:53 PM  Result Value Ref Range   Prothrombin Time 14.6 11.4 - 15.2 seconds   INR 1.15     Comment: Performed at [facility],  [place], [place] [address]  Fibrinogen     Status: None   Collection Time: 06/28/17  9:53 PM  Result Value Ref Range   Fibrinogen 389 210 - 475 mg/dL    Comment: Performed at [facility], [address] [place], [place] [address]  Glucose, capillary     Status: Abnormal   Collection Time: 06/28/17 10:11 PM  Result Value Ref Range   Glucose-Capillary 100 (H) 65 - 99 mg/dL   Comment 1 Document in Chart   CBC     Status: Abnormal   Collection Time: 06/29/17  6:45 AM  Result Value Ref Range   WBC 18.6 (H) 3.6 - 11.0 K/uL   RBC 3.96 3.80 - 5.20 MIL/uL   Hemoglobin 11.6 (L) 12.0 - 16.0 g/dL   HCT 33.8 (L) 35.0 - 47.0 %   MCV 85.5 80.0 - 100.0 fL   MCH 29.3 26.0 - 34.0 pg   MCHC 34.3 32.0 - 36.0 g/dL   RDW 15.8 (H) 11.5 - 14.5 %   Platelets 185 150 - 440 K/uL    Comment: Performed at [facility], [address] [place], [place] [address]  Urinalysis, Complete w Microscopic     Status: Abnormal   Collection Time:  06/29/17  8:12 AM  Result Value Ref Range   Color, Urine AMBER (A) YELLOW    Comment: BIOCHEMICALS MAY BE AFFECTED BY COLOR   APPearance TURBID (A) CLEAR   Specific Gravity, Urine 1.026 1.005 - 1.030   pH 5.0 5.0 - 8.0   Glucose, UA 50 (A) NEGATIVE mg/dL   Hgb urine dipstick LARGE (A) NEGATIVE   Bilirubin Urine NEGATIVE NEGATIVE   Ketones, ur 20 (A) NEGATIVE mg/dL   Protein, ur 30 (A) NEGATIVE mg/dL   Nitrite NEGATIVE NEGATIVE   Leukocytes, UA NEGATIVE NEGATIVE   RBC / HPF 21-50 0 - 5 RBC/hpf   WBC, UA 11-20 0 - 5 WBC/hpf   Bacteria, UA NONE SEEN NONE SEEN   Squamous Epithelial / LPF 0-5 0 - 5   Mucus PRESENT    Amorphous Crystal PRESENT     Comment: Performed at [facility], [address] [place], [place] [address]  Sodium, urine, random     Status: None   Collection Time: 06/29/17  8:12 AM  Result Value Ref Range   Sodium, Ur 161 mmol/L    Comment: Performed at [facility], [address] [place], [place] [address]  Creatinine, urine, random     Status: None   Collection Time: 06/29/17  8:12 AM  Result Value Ref Range   Creatinine, Urine 173 mg/dL    Comment: Performed at [facility], [address] [place], [place] [address]  Basic metabolic panel     Status: Abnormal   Collection Time: 06/29/17  9:37 AM  Result Value Ref Range   Sodium 133 (L) 135 - 145 mmol/L   Potassium 4.3 3.5 - 5.1 mmol/L   Chloride 104 101 - 111 mmol/L   CO2 21 (L) 22 - 32 mmol/L   Glucose, Bld 112 (H) 65 - 99 mg/dL   BUN 7 6 - 20 mg/dL   Creatinine, Ser 0.48 0.44 - 1.00 mg/dL   Calcium 8.6 (L) 8.9 - 10.3 mg/dL   GFR calc non Af Amer >60 >60 mL/min   GFR calc Af Amer >60 >60 mL/min    Comment: (NOTE) The eGFR has been calculated using the CKD EPI equation. This calculation has  not been validated in all clinical situations. eGFR's persistently <60 mL/min signify possible Chronic Kidney Disease.    Anion gap 8 5 - 15    Comment: Performed at [facility], [address], [place], [place] [address]    Assessment:   27 y.o. [phone] postoperative day #0 in stable condition.  Plan:  1) Acute blood loss anemia - hemodynamically stable and asymptomatic.  2) Blood Type --/--/A POS (06/04 1315) / Rubella 6.69 (11/19 1016) / Varicella immune  3) TDAP status: given 04/20/2017  4) Formula feeding, plans BTL.  5) Urine output and color improved following 1000 mL LR bolus, now pale yellow with no sediment apparent.  6) Bakri balloon remains in place, have removed 100 mL at 1000 with scant output, additional 100 mL removed at 1200. Plan to take Bakri out at 1400.  7) Disposition: continue postpartum care, transfer to mother-baby once Bakri removed if stable.  Avel Sensor, CNM 06/29/2017  12:18 PM

## 2017-06-30 LAB — CBC
HCT: 28.7 % — ABNORMAL LOW (ref 35.0–47.0)
Hemoglobin: 9.6 g/dL — ABNORMAL LOW (ref 12.0–16.0)
MCH: 28.8 pg (ref 26.0–34.0)
MCHC: 33.3 g/dL (ref 32.0–36.0)
MCV: 86.4 fL (ref 80.0–100.0)
Platelets: 163 10*3/uL (ref 150–440)
RBC: 3.32 MIL/uL — ABNORMAL LOW (ref 3.80–5.20)
RDW: 16.1 % — ABNORMAL HIGH (ref 11.5–14.5)
WBC: 11 10*3/uL (ref 3.6–11.0)

## 2017-06-30 LAB — SURGICAL PATHOLOGY

## 2017-06-30 LAB — GLUCOSE, CAPILLARY
Glucose-Capillary: 69 mg/dL (ref 65–99)
Glucose-Capillary: 74 mg/dL (ref 65–99)

## 2017-06-30 NOTE — Progress Notes (Signed)
Admit Date: 06/28/2017 Today's Date: 06/30/2017  Subjective: Postpartum Day 2: Cesarean Delivery Patient reports incisional pain, tolerating PO and + flatus.   Min lochia/ bleeding    Bakri removed yesterday w no significant rebound bleeding encountered Foley in place, yellow urine  Objective: Vital signs in last 24 hours: Temp:  [98 F (36.7 C)-98.4 F (36.9 C)] 98 F (36.7 C) (06/06 0357) Pulse Rate:  [87-106] 101 (06/06 0357) Resp:  [16-20] 20 (06/06 0357) BP: (102-127)/(60-83) 104/69 (06/06 0357) SpO2:  [98 %-99 %] 98 % (06/06 0357)  Physical Exam:  General: alert, cooperative and no distress Lochia: appropriate Uterine Fundus: firm Incision: healing well, no significant drainage, no significant erythema DVT Evaluation: No evidence of DVT seen on physical exam. Negative Homan's sign.  Recent Labs    06/29/17 0645 06/30/17 0636  HGB 11.6* 9.6*  HCT 33.8* 28.7*    Assessment/Plan: Status post Cesarean section. Doing well postoperatively.  Continue current care. BTL done Bottle feeding Plan- remove foley today, later remove IV; Ambulate; DIet reg (diabetic) Cont Metformin at basal level 500mg  daily (pre pregnancy regimen)  Cheryl Carpenter 06/30/2017, 8:07 AM

## 2017-06-30 NOTE — Plan of Care (Signed)
Patient's vital signs stable with HR 104 (tachycardia); IV fluids continued; IV site clear; foley catheter left in per MD order until MD rounds this a.m.; tolerating regular (carb controlled) diabetic diet; good po fluids; fundus firm; small amount rubra lochia; TCDB well; sits on side of bed by self; SCD's continued; good maternal-infant bonding observed; nurse instructed and patient v/u of s/s incision infection as plans for discharge; patient bottle feeds infant with good technique observed.

## 2017-07-01 DIAGNOSIS — O9081 Anemia of the puerperium: Secondary | ICD-10-CM | POA: Diagnosis not present

## 2017-07-01 LAB — HEMOGLOBIN: Hemoglobin: 9.9 g/dL — ABNORMAL LOW (ref 12.0–16.0)

## 2017-07-01 LAB — GLUCOSE, CAPILLARY
Glucose-Capillary: 136 mg/dL — ABNORMAL HIGH (ref 65–99)
Glucose-Capillary: 55 mg/dL — ABNORMAL LOW (ref 65–99)
Glucose-Capillary: 100 mg/dL — ABNORMAL HIGH (ref 65–99)
Glucose-Capillary: 54 mg/dL — ABNORMAL LOW (ref 65–99)

## 2017-07-01 MED ORDER — IBUPROFEN 600 MG PO TABS: 600.0000 mg | ORAL_TABLET | Freq: Four times a day (QID) | ORAL | 1 refills | Status: DC | PRN

## 2017-07-01 MED ORDER — OXYCODONE-ACETAMINOPHEN 5-325 MG PO TABS: 1.0000 | ORAL_TABLET | Freq: Four times a day (QID) | ORAL | 0 refills | Status: AC | PRN

## 2017-07-01 NOTE — Discharge Instructions (Signed)
Before [facility] Before your baby arrives it is important to:  Have all of the supplies that you will need to care for your baby.  Know where to go if there is an emergency.  Discuss the baby's arrival with other family members.  What supplies will I need?  It is recommended that you have the following supplies: Large Items  Crib.  Crib mattress.  Rear-facing infant car seat. If possible, have a trained professional check to make sure that it is installed correctly.  Feeding  6-8 bottles that are 4-5 oz in size.  6-8 nipples.  Bottle brush.  Sterilizer, or a large pan or kettle with a lid.  A way to boil and cool water.  If you will be breastfeeding: ? Breast pump. ? Nipple cream. ? Nursing bra. ? Breast pads. ? Breast shields.  If you will be formula feeding: ? Formula. ? Measuring cups. ? Measuring spoons.  Bathing  Mild baby soap and baby shampoo.  Petroleum jelly.  Soft cloth towel and washcloth.  Hooded towel.  Cotton balls.  Bath basin.  Other Supplies  Rectal thermometer.  Bulb syringe.  Baby wipes or washcloths for diaper changes.  Diaper bag.  Changing pad.  Clothing, including one-piece outfits and pajamas.  Baby nail clippers.  Receiving blankets.  Mattress pad and sheets for the crib.  Night-light for the babys room.  Baby monitor.  2 or 3 pacifiers.  Either 24-36 cloth diapers and waterproof diaper covers or a box of disposable diapers. You may need to use as many as 10-12 diapers per day.  How do I prepare for an emergency? Prepare for an emergency by:  Knowing how to get to the nearest hospital.  Listing the phone numbers of your baby's health care providers near your home phone and in your cell phone.  How do I prepare my family?  Decide how to handle visitors.  If you have other children: ? Talk with them about the baby coming home. Ask them how they feel about it. ? Read a book together  about being a new big brother or sister. ? Find ways to let them help you prepare for the new baby. ? Have someone ready to care for them while you are in the hospital. This information is not intended to replace advice given to you by your health care provider. Make sure you discuss any questions you have with your health care provider. Document Released: 12/25/2007 Document Revised: 06/19/2015 Document Reviewed: 12/19/2013 Elsevier Interactive Patient Education  2018 [employer].   Anemia Anemia is a condition in which you do not have enough red blood cells or hemoglobin. Hemoglobin is a substance in red blood cells that carries oxygen. When you do not have enough red blood cells or hemoglobin (are anemic), your body cannot get enough oxygen and your organs may not work properly. As a result, you may feel very tired or have other problems. What are the causes? Common causes of anemia include:  Excessive bleeding. Anemia can be caused by excessive bleeding inside or outside the body, including bleeding from the intestine or from periods in women.  Poor nutrition.  Long-lasting (chronic) kidney, thyroid, and liver disease.  Bone marrow disorders.  Cancer and treatments for cancer.  HIV (human immunodeficiency virus) and AIDS (acquired immunodeficiency syndrome).  Treatments for HIV and AIDS.  Spleen problems.  Blood disorders.  Infections, medicines, and autoimmune disorders that destroy red blood cells.  What are the signs  or symptoms? Symptoms of this condition include:  Minor weakness.  Dizziness.  Headache.  Feeling heartbeats that are irregular or faster than normal (palpitations).  Shortness of breath, especially with exercise.  Paleness.  Cold sensitivity.  Indigestion.  Nausea.  Difficulty sleeping.  Difficulty concentrating.  Symptoms may occur suddenly or develop slowly. If your anemia is mild, you may not have symptoms. How is this  diagnosed? This condition is diagnosed based on:  Blood tests.  Your medical history.  A physical exam.  Bone marrow biopsy.  Your health care provider may also check your stool (feces) for blood and may do additional testing to look for the cause of your bleeding. You may also have other tests, including:  Imaging tests, such as a CT scan or MRI.  Endoscopy.  Colonoscopy.  How is this treated? Treatment for this condition depends on the cause. If you continue to lose a lot of blood, you may need to be treated at a hospital. Treatment may include:  Taking supplements of iron, vitamin M01, or folic acid.  Taking a hormone medicine (erythropoietin) that can help to stimulate red blood cell growth.  Having a blood transfusion. This may be needed if you lose a lot of blood.  Making changes to your diet.  Having surgery to remove your spleen.  Follow these instructions at home:  Take over-the-counter and prescription medicines only as told by your health care provider.  Take supplements only as told by your health care provider.  Follow any diet instructions that you were given.  Keep all follow-up visits as told by your health care provider. This is important. Contact a health care provider if:  You develop new bleeding anywhere in the body. Get help right away if:  You are very weak.  You are short of breath.  You have pain in your abdomen or chest.  You are dizzy or feel faint.  You have trouble concentrating.  You have bloody or black, tarry stools.  You vomit repeatedly or you vomit up blood. Summary  Anemia is a condition in which you do not have enough red blood cells or enough of a substance in your red blood cells that carries oxygen (hemoglobin).  Symptoms may occur suddenly or develop slowly.  If your anemia is mild, you may not have symptoms.  This condition is diagnosed with blood tests as well as a medical history and physical exam. Other  tests may be needed.  Treatment for this condition depends on the cause of the anemia. This information is not intended to replace advice given to you by your health care provider. Make sure you discuss any questions you have with your health care provider. Document Released: 02/19/2004 Document Revised: 02/13/2016 Document Reviewed: 02/13/2016 Elsevier Interactive Patient Education  2018 [employer]. Cesarean Delivery, Care After Refer to this sheet in the next few weeks. These instructions provide you with information about caring for yourself after your procedure. Your health care provider may also give you more specific instructions. Your treatment has been planned according to current medical practices, but problems sometimes occur. Call your health care provider if you have any problems or questions after your procedure. What can I expect after the procedure? After the procedure, it is common to have:  A small amount of blood or clear fluid coming from the incision.  Some redness, swelling, and pain in your incision area.  Some abdominal pain and soreness.  Vaginal bleeding (lochia).  Pelvic cramps.  Fatigue.  Follow these instructions at home: Incision care   Follow instructions from your health care provider about how to take care of your incision. Make sure you: ? Wash your hands with soap and water before you change your bandage (dressing). If soap and water are not available, use hand sanitizer. ? If you have a dressing, change it as told by your health care provider. ? Leave stitches (sutures), skin staples, skin glue, or adhesive strips in place. These skin closures may need to stay in place for 2 weeks or longer. If adhesive strip edges start to loosen and curl up, you may trim the loose edges. Do not remove adhesive strips completely unless your health care provider tells you to do that.  Check your incision area every day for signs of infection. Check for: ? More  redness, swelling, or pain. ? More fluid or blood. ? Warmth. ? Pus or a bad smell.  When you cough or sneeze, hug a pillow. This helps with pain and decreases the chance of your incision opening up (dehiscing). Do this until your incision heals. Medicines  Take over-the-counter and prescription medicines only as told by your health care provider.  If you were prescribed an antibiotic medicine, take it as told by your health care provider. Do not stop taking the antibiotic until it is finished. Driving  Do not drive or operate heavy machinery while taking prescription pain medicine. Lifestyle  Do not drink alcohol. This is especially important if you are breastfeeding or taking pain medicine.  Do not use tobacco products, including cigarettes, chewing tobacco, or e-cigarettes. If you need help quitting, ask your health care provider. Tobacco can delay wound healing. Eating and drinking  Drink at least 8 eight-ounce glasses of water every day unless told not to by your health care provider. If you breastfeed, you may need to drink more water than this.  Eat high-fiber foods every day. These foods may help prevent or relieve constipation. High-fiber foods include: ? Whole grain cereals and breads. ? Brown rice. ? Beans. ? Fresh fruits and vegetables. Activity  Return to your normal activities as told by your health care provider. Ask your health care provider what activities are safe for you.  Rest as much as possible. Try to rest or take a nap while your baby is sleeping.  Do not lift anything that is heavier than your baby or 10 lb (4.5 kg) as told by your health care provider.  Ask your health care provider when you can engage in sexual activity. This may depend on your: ? Risk of infection. ? Healing rate. ? Comfort and desire to engage in sexual activity. Bathing  Do not take baths, swim, or use a hot tub until your health care provider approves. Ask your health care  provider if you can take showers. You may only be allowed to take sponge baths until your incision heals. General instructions  Do not use tampons or douches until your health care provider approves.  Wear: ? Loose, comfortable clothing. ? A supportive and well-fitting bra.  Watch for any blood clots that may pass from your vagina. These may look like clumps of dark red, brown, or black discharge.  Keep your perineum clean and dry as told by your health care provider.  Wipe from front to back when you use the toilet.  If possible, have someone help you care for your baby and help with household activities for a few days after you leave the hospital.  Keep all follow-up visits for you and your baby as told by your health care provider. This is important. Contact a health care provider if:  You have: ? Bad-smelling vaginal discharge. ? Difficulty urinating. ? Pain when urinating. ? A sudden increase or decrease in the frequency of your bowel movements. ? More redness, swelling, or pain around your incision. ? More fluid or blood coming from your incision. ? Pus or a bad smell coming from your incision. ? A fever. ? A rash. ? Little or no interest in activities you used to enjoy. ? Questions about caring for yourself or your baby. ? Nausea.  Your incision feels warm to the touch.  Your breasts turn red or become painful or hard.  You feel unusually sad or worried.  You vomit.  You pass large blood clots from your vagina. If you pass a blood clot, save it to show to your health care provider. Do not flush blood clots down the toilet without showing your health care provider.  You urinate more than usual.  You are dizzy or light-headed.  You have not breastfed and have not had a menstrual period for 12 weeks after delivery.  You stopped breastfeeding and have not had a menstrual period for 12 weeks after stopping breastfeeding. Get help right away if:  You  have: ? Pain that does not go away or get better with medicine. ? Chest pain. ? Difficulty breathing. ? Blurred vision or spots in your vision. ? Thoughts about hurting yourself or your baby. ? New pain in your abdomen or in one of your legs. ? A severe headache.  You faint.  You bleed from your vagina so much that you fill two sanitary pads in one hour. This information is not intended to replace advice given to you by your health care provider. Make sure you discuss any questions you have with your health care provider. Document Released: 10/03/2001 Document Revised: 02/14/2016 Document Reviewed: 12/16/2014 Elsevier Interactive Patient Education  [employer].

## 2017-07-01 NOTE — Progress Notes (Signed)
Reviewed all patients discharge instructions and handouts regarding postpartum bleeding, no intercourse for 6 weeks, signs and symptoms of mastitis and postpartum bleu's. Reviewed discharge instructions for newborn regarding proper cord care, how and when to bathe the newborn, nail care, proper way to take the baby's temperature, along with safe sleep. Questions have been answered at this time. Patient to be discharged via wheelchair with axillary.  

## 2017-07-01 NOTE — Progress Notes (Signed)
Patient 8 am blood glucose check resulted in 55, rechecked after patient ate some breakfast and drank sweet tea - blood glucose resulted as 54, this RN notified Francois Elk, Md about results. [facility] This RN notified Colleen, CNM of findings and blood glucose was rechecked and resulted in 100 per student nurse. CNM notified of findings, order received to hold metformin until further evaluation. Pt alert, resting in bed. Continue to assess.

## 2017-07-08 ENCOUNTER — Encounter: Payer: Self-pay | Admitting: Obstetrics and Gynecology

## 2017-07-08 ENCOUNTER — Ambulatory Visit (INDEPENDENT_AMBULATORY_CARE_PROVIDER_SITE_OTHER): Payer: 59 | Admitting: Obstetrics and Gynecology

## 2017-07-08 VITALS — BP 118/84 | HR 90 | Ht 65.0 in | Wt 211.0 lb

## 2017-07-08 DIAGNOSIS — Z4889 Encounter for other specified surgical aftercare: Secondary | ICD-10-CM

## 2017-07-11 NOTE — Progress Notes (Signed)
Postoperative Follow-up Patient presents post op from cesarean section 1weeks ago for PROM and history of prior cesarean section and tubal ligation.  Complicated by postpartum hemorrhage requiring placement of Bakri ballon  Subjective: Patient reports no improvement in her preop symptoms. Eating a regular diet without difficulty. Pain is controlled without any medications.  Activity: normal activities of daily living.  Objective: Blood pressure 118/84, pulse 90, height 5' 5" (1.651 m), weight 211 lb (95.7 kg), unknown if currently breastfeeding.  Gen: NAD HEENT: normocephalic, anicteric Pulmonary: no increased work of breathing Abdomen: soft, non-tender, non-distended, some echymomis around ON-Q site, incision D/C/I Insorb stable line Ext: no edma Neurologic: grossly intact  Admission on 06/28/2017, Discharged on 07/01/2017  Component Date Value Ref Range Status  . WBC 06/28/2017 12.8* 3.6 - 11.0 K/uL Final  . RBC 06/28/2017 4.55  3.80 - 5.20 MIL/uL Final  . Hemoglobin 06/28/2017 13.2  12.0 - 16.0 g/dL Final  . HCT 06/28/2017 38.8  35.0 - 47.0 % Final  . MCV 06/28/2017 85.2  80.0 - 100.0 fL Final  . MCH 06/28/2017 29.0  26.0 - 34.0 pg Final  . MCHC 06/28/2017 34.1  32.0 - 36.0 g/dL Final  . RDW 06/28/2017 16.2* 11.5 - 14.5 % Final  . Platelets 06/28/2017 204  150 - 440 K/uL Final   Performed at [facility], [address], [place], [place] [address]  . RPR Ser Ql 06/28/2017 Non Reactive  Non Reactive Final   Comment: (NOTE) Performed At: [facility] [place], [place] [id number] Rush Farmer MD EH:[id number] Performed at [facility], [address] [place], [place] [address]   . ABO/RH(D) 06/28/2017 A POS   Final  . Antibody Screen 06/28/2017 NEG   Final  . Sample Expiration 06/28/2017    Final                   Value:07/01/2017 Performed at [facility], [address], [place], [place] [address]   .  Glucose-Capillary 06/28/2017 62* 65 - 99 mg/dL Final  . SURGICAL PATHOLOGY 06/28/2017    Final                   Value:Surgical Pathology CASE: [id number] PATIENT: Cheryl Carpenter Surgical Pathology Report     SPECIMEN SUBMITTED: A. Fallopian tube segment, left B. Fallopian tube segment, right  CLINICAL HISTORY: None provided  PRE-OPERATIVE DIAGNOSIS: Term pregnancy, diabetes, prior cesarean, patient desires sterilization  POST-OPERATIVE DIAGNOSIS: Same as pre-op     DIAGNOSIS: A. LEFT FALLOPIAN TUBE SEGMENT; STERILIZATION: - FALLOPIAN TUBE SEGMENT WITH FULL CROSS SECTION OF THE LUMEN.  B. RIGHT FALLOPIAN TUBE SEGMENT; STERILIZATION: - FALLOPIAN TUBE SEGMENT WITH FULL CROSS SECTION OF THE LUMEN.   GROSS DESCRIPTION: A. Labeled: Left fallopian tube segment Received: In formalin Size: 1.6 x 0.8 x 0.6 cm Other findings: Purple tubular fragments  Block summary: 1 - representative cross-sections  B. Labeled: Right Fallopian tube segment Received: In formalin Size: 1.1 x 0.8 x 0.6 cm Other findings: Pink tubular fragment  Block summary: 1 - representative cross                         -sections    Final Diagnosis performed by Delorse Lek, MD.   Electronically signed 06/30/2017 2:38:11PM The electronic signature indicates that the named Attending Pathologist has evaluated the specimen  Technical component performed at [facility], [address] [place], [place] [address] Lab:  [phone] Dir: Rush Farmer, MD, MMM  Professional component performed at [facility], [place], [place], [place] [address] Lab: [phone] Dir: Dellia Nims. Rubinas, MD   . Glucose-Capillary 06/28/2017 136* 65 - 99 mg/dL Final  . WBC 06/28/2017 13.1* 3.6 - 11.0 K/uL Final  . RBC 06/28/2017 3.91  3.80 - 5.20 MIL/uL Final  . Hemoglobin 06/28/2017 11.3* 12.0 - 16.0 g/dL Final  . HCT 06/28/2017 33.7* 35.0 - 47.0 % Final  . MCV 06/28/2017 86.1  80.0 - 100.0  fL Final  . MCH 06/28/2017 28.9  26.0 - 34.0 pg Final  . MCHC 06/28/2017 33.5  32.0 - 36.0 g/dL Final  . RDW 06/28/2017 15.7* 11.5 - 14.5 % Final  . Platelets 06/28/2017 159  150 - 440 K/uL Final   Performed at [facility], [address], [place], [place] [address]  . aPTT 06/28/2017 28  24 - 36 seconds Final   Performed at [facility], [address] [place], [place] [address]  . Prothrombin Time 06/28/2017 14.6  11.4 - 15.2 seconds Final  . INR 06/28/2017 1.15   Final   Performed at [facility], [address] [place], [place] [address]  . Fibrinogen 06/28/2017 389  210 - 475 mg/dL Final   Performed at [facility], [address] [place], [place] [address]  . Glucose-Capillary 06/28/2017 100* 65 - 99 mg/dL Final  . Comment 1 06/28/2017 Document in Chart   Final  . WBC 06/29/2017 18.6* 3.6 - 11.0 K/uL Final  . RBC 06/29/2017 3.96  3.80 - 5.20 MIL/uL Final  . Hemoglobin 06/29/2017 11.6* 12.0 - 16.0 g/dL Final  . HCT 06/29/2017 33.8* 35.0 - 47.0 % Final  . MCV 06/29/2017 85.5  80.0 - 100.0 fL Final  . MCH 06/29/2017 29.3  26.0 - 34.0 pg Final  . MCHC 06/29/2017 34.3  32.0 - 36.0 g/dL Final  . RDW 06/29/2017 15.8* 11.5 - 14.5 % Final  . Platelets 06/29/2017 185  150 - 440 K/uL Final   Performed at [facility], [address], [place], [place] [address]  . Sodium 06/29/2017 133* 135 - 145 mmol/L Final  . Potassium 06/29/2017 4.3  3.5 - 5.1 mmol/L Final  . Chloride 06/29/2017 104  101 - 111 mmol/L Final  . CO2 06/29/2017 21* 22 - 32 mmol/L Final  . Glucose, Bld 06/29/2017 112* 65 - 99 mg/dL Final  . BUN 06/29/2017 7  6 - 20 mg/dL Final  . Creatinine, Ser 06/29/2017 0.48  0.44 - 1.00 mg/dL Final  . Calcium 06/29/2017 8.6* 8.9 - 10.3 mg/dL Final  . GFR calc non Af Amer 06/29/2017 >60  >60 mL/min Final  . GFR calc Af Amer 06/29/2017 >60  >60 mL/min Final   Comment: (NOTE) The eGFR has been calculated using the CKD EPI  equation. This calculation has not been validated in all clinical situations. eGFR's persistently <60 mL/min signify possible Chronic Kidney Disease.   Georgiann Hahn gap 06/29/2017 8  5 - 15 Final   Performed at [facility], [address] [place], [place] [address]  . Color, Urine 06/29/2017 AMBER* YELLOW Final   BIOCHEMICALS MAY BE AFFECTED BY COLOR  . APPearance 06/29/2017 TURBID* CLEAR Final  . Specific Gravity, Urine 06/29/2017 1.026  1.005 - 1.030 Final  . pH 06/29/2017 5.0  5.0 - 8.0 Final  . Glucose, UA 06/29/2017 50* NEGATIVE mg/dL Final  . Hgb urine dipstick 06/29/2017 LARGE* NEGATIVE Final  .  Bilirubin Urine 06/29/2017 NEGATIVE  NEGATIVE Final  . Ketones, ur 06/29/2017 20* NEGATIVE mg/dL Final  . Protein, ur 06/29/2017 30* NEGATIVE mg/dL Final  . Nitrite 06/29/2017 NEGATIVE  NEGATIVE Final  . Leukocytes, UA 06/29/2017 NEGATIVE  NEGATIVE Final  . RBC / HPF 06/29/2017 21-50  0 - 5 RBC/hpf Final  . WBC, UA 06/29/2017 11-20  0 - 5 WBC/hpf Final  . Bacteria, UA 06/29/2017 NONE SEEN  NONE SEEN Final  . Squamous Epithelial / LPF 06/29/2017 0-5  0 - 5 Final  . Mucus 06/29/2017 PRESENT   Final  . Amorphous Crystal 06/29/2017 PRESENT   Final   Performed at [facility], [address] [place], [place] [address]  . Sodium, Ur 06/29/2017 161  mmol/L Final   Performed at [facility], [address] [place], [place] [address]  . Creatinine, Urine 06/29/2017 173  mg/dL Final   Performed at [facility], [address] [place], [place] [address]  . Glucose-Capillary 06/29/2017 67  65 - 99 mg/dL Final  . WBC 06/30/2017 11.0  3.6 - 11.0 K/uL Final  . RBC 06/30/2017 3.32* 3.80 - 5.20 MIL/uL Final  . Hemoglobin 06/30/2017 9.6* 12.0 - 16.0 g/dL Final  . HCT 06/30/2017 28.7* 35.0 - 47.0 % Final  . MCV 06/30/2017 86.4  80.0 - 100.0 fL Final  . MCH 06/30/2017 28.8  26.0 - 34.0 pg Final  . MCHC 06/30/2017 33.3  32.0 - 36.0 g/dL Final  . RDW 06/30/2017  16.1* 11.5 - 14.5 % Final  . Platelets 06/30/2017 163  150 - 440 K/uL Final   Performed at [facility], [address] [place], [place] [address]  . Glucose-Capillary 06/29/2017 67  65 - 99 mg/dL Final  . Comment 1 06/29/2017 Notify RN   Final  . Glucose-Capillary 06/30/2017 69  65 - 99 mg/dL Final  . Comment 1 06/30/2017 Notify RN   Final  . Comment 2 06/30/2017 Document in Chart   Final  . Hemoglobin 07/01/2017 9.9* 12.0 - 16.0 g/dL Final   Performed at [facility], [address] [place], [place] [address]  . Glucose-Capillary 06/30/2017 74  65 - 99 mg/dL Final  . Comment 1 06/30/2017 Notify RN   Final  . Comment 2 06/30/2017 Document in Chart   Final  . Glucose-Capillary 07/01/2017 136* 65 - 99 mg/dL Final  . Glucose-Capillary 07/01/2017 55* 65 - 99 mg/dL Final  . Glucose-Capillary 07/01/2017 54* 65 - 99 mg/dL Final  . Glucose-Capillary 07/01/2017 100* 65 - 99 mg/dL Final    Assessment: 27 y.o. s/p RLTCS & BTL stable  Plan: Patient has done well after surgery with no apparent complications.  I have discussed the post-operative course to date, and the expected progress moving forward.  The patient understands what complications to be concerned about.  I will see the patient in routine follow up, or sooner if needed.    Activity plan: No heavy lifting.    , MD, FACOG [facility], [facility]   

## 2017-08-09 ENCOUNTER — Ambulatory Visit (INDEPENDENT_AMBULATORY_CARE_PROVIDER_SITE_OTHER): Payer: 59 | Admitting: Obstetrics and Gynecology

## 2017-08-09 ENCOUNTER — Encounter: Payer: Self-pay | Admitting: Obstetrics and Gynecology

## 2017-08-09 VITALS — BP 110/78 | HR 73 | Wt 210.0 lb

## 2017-08-09 DIAGNOSIS — F53 Postpartum depression: Secondary | ICD-10-CM

## 2017-08-09 DIAGNOSIS — O99345 Other mental disorders complicating the puerperium: Secondary | ICD-10-CM

## 2017-08-09 MED ORDER — HYDROXYZINE HCL 25 MG PO TABS: 25.0000 mg | ORAL_TABLET | Freq: Four times a day (QID) | ORAL | 2 refills | Status: DC | PRN

## 2017-08-09 MED ORDER — CITALOPRAM HYDROBROMIDE 20 MG PO TABS: 20.0000 mg | ORAL_TABLET | Freq: Every day | ORAL | 2 refills | Status: DC

## 2017-08-09 NOTE — Progress Notes (Signed)
Postpartum Visit  Chief Complaint:  Chief Complaint  Patient presents with  . Postpartum Care    C/S 06/28/17    History of Present Illness: Patient is a 27 y.o. [id number] presents for postpartum visit.  Date of delivery:  Cesarean Section: repeat cesarean section (PROM) Pregnancy or labor problems:  Yes pre-existing DM Any problems since the delivery:  Yes (moods)  Newborn Details:  SINGLETON :  1. BabyGender female. Birth weight: 9lbs 2oz Maternal Details:  Breast or formula feeding: plans to bottle feed  Contraception after delivery: Yes  Any bowel or bladder issues: No  Post partum depression/anxiety noted:  yes Edinburgh Post-Partum Depression Score:18 i Date of last PAP: 08/18/2016 no abnormalities   Review of Systems: ROS  The following portions of the patient's history were reviewed and updated as appropriate: allergies, current medications, past family history, past medical history, past social history, past surgical history and problem list.  Past Medical History:  Past Medical History:  Diagnosis Date  . Diabetes mellitus without complication [facility])     Past Surgical History:  Past Surgical History:  Procedure Laterality Date  . CESAREAN SECTION  2010   failed VAVD  . CESAREAN SECTION N/A 06/28/2017   Procedure: CESAREAN SECTION, BTL;  Surgeon: Vena Austria, MD;  Location: [facility] ORS;  Service: Obstetrics;  Laterality: N/A;  . FOOT SURGERY    . nexplanon removal  07/2016  . TUBAL LIGATION      Family History:  Family History  Problem Relation Age of Onset  . Diabetes Father   . Heart failure Other     Social History:  Social History   Socioeconomic History  . Marital status: Significant Other    Spouse name: Not on file  . Number of children: Not on file  . Years of education: Not on file  . Highest education level: Not on file  Occupational History  . Not on file  Social Needs  . Financial resource strain: Not on file  . Food  insecurity:    Worry: Not on file    Inability: Not on file  . Transportation needs:    Medical: Not on file    Non-medical: Not on file  Tobacco Use  . Smoking status: Never Smoker  . Smokeless tobacco: Never Used  Substance and Sexual Activity  . Alcohol use: No  . Drug use: No  . Sexual activity: Yes    Birth control/protection: Surgical  Lifestyle  . Physical activity:    Days per week: Not on file    Minutes per session: Not on file  . Stress: Not on file  Relationships  . Social connections:    Talks on phone: Not on file    Gets together: Not on file    Attends religious service: Not on file    Active member of club or organization: Not on file    Attends meetings of clubs or organizations: Not on file    Relationship status: Not on file  . Intimate partner violence:    Fear of current or ex partner: Not on file    Emotionally abused: Not on file    Physically abused: Not on file    Forced sexual activity: Not on file  Other Topics Concern  . Not on file  Social History Narrative  . Not on file    Allergies:  Allergies  Allergen Reactions  . Fish Allergy Anaphylaxis  . Peanut-Containing Drug Products Anaphylaxis  . Shellfish Allergy  Anaphylaxis  . Sulfa Antibiotics Nausea And Vomiting    Medications: Prior to Admission medications   Medication Sig Start Date End Date Taking? Authorizing Provider  cetirizine (ZYRTEC) 10 MG tablet Take 10 mg by mouth daily as needed for allergies.    [provider]  ibuprofen (ADVIL,MOTRIN) 600 MG tablet Take 1 tablet (600 mg total) by mouth every 6 (six) hours as needed for mild pain or cramping. 07/01/17   Farrel ConnersGutierrez, Colleen, CNM  metFORMIN (GLUCOPHAGE) 500 MG tablet Take 500 mg by mouth daily with breakfast.     [provider]  Prenatal Vit-Fe Fumarate-FA (MULTIVITAMIN-PRENATAL) 27-0.8 MG TABS tablet Take 1 tablet by mouth daily.     [provider]    Physical Exam Blood pressure 110/78,  pulse 73, weight 210 lb (95.3 kg), last menstrual period 08/05/2017, not currently breastfeeding.  General: NAD HEENT: normocephalic, anicteric Pulmonary: No increased work of breathing Abdomen: NABS, soft, non-tender, non-distended.  Umbilicus without lesions.  No hepatomegaly, splenomegaly or masses palpable. No evidence of hernia. Incision D/C/I Genitourinary:  External: Normal external female genitalia.  Normal urethral meatus, normal  Bartholin's and Skene's glands.    Vagina: Normal vaginal mucosa, no evidence of prolapse.    Cervix: Grossly normal in appearance, no bleeding  Uterus: Non-enlarged, mobile, normal contour.  No CMT  Adnexa: ovaries non-enlarged, no adnexal masses  Rectal: deferred Extremities: no edema, erythema, or tenderness Neurologic: Grossly intact Psychiatric: mood appropriate, affect full  Edinburgh Postnatal Depression Scale - 08/09/17 1110      Edinburgh Postnatal Depression Scale:  In the Past 7 Days   I have been able to laugh and see the funny side of things.  1    I have looked forward with enjoyment to things.  1    I have blamed myself unnecessarily when things went wrong.  2    I have been anxious or worried for no good reason.  3    I have felt scared or panicky for no good reason.  3    Things have been getting on top of me.  2    I have been so unhappy that I have had difficulty sleeping.  2    I have felt sad or miserable.  2    I have been so unhappy that I have been crying.  2    The thought of harming myself has occurred to me.  0    Edinburgh Postnatal Depression Scale Total  18       Assessment: 27 y.o. [id number] presenting for 6 week postpartum visit  Plan: Problem List Items Addressed This Visit    None    Visit Diagnoses    Postpartum depression    -  Primary   Relevant Medications   citalopram (CELEXA) 20 MG tablet   hydrOXYzine (ATARAX/VISTARIL) 25 MG tablet   Encounter for postpartum visit          1) Contraception -  Education given regarding options for contraception, as well as compatibility with breast feeding if applicable.  Patient plans on tubal ligation for contraception.  2)  Pap - ASCCP guidelines and rational discussed.  ASCCP guidelines and rational discussed.  Patient opts for every 3 years screening interval  3) Patient underwent screening for postpartum depression with positive EPDS - start citalopram feels anxiety predominates - prn vistaril - follow up 4 weeks  4) Return in about 1 month (around 09/06/2017) for medication follow up.   Vena AustriaAndreas Matty Vanroekel, MD, Evern CoreFACOG  [facility], [facility] 08/09/2017, 11:46 AM

## 2017-09-12 ENCOUNTER — Ambulatory Visit: Payer: 59 | Admitting: Obstetrics and Gynecology

## 2018-04-25 DIAGNOSIS — R5381 Other malaise: Secondary | ICD-10-CM | POA: Diagnosis not present

## 2018-04-25 DIAGNOSIS — G479 Sleep disorder, unspecified: Secondary | ICD-10-CM | POA: Diagnosis not present

## 2018-04-25 DIAGNOSIS — E119 Type 2 diabetes mellitus without complications: Secondary | ICD-10-CM | POA: Diagnosis not present

## 2018-04-25 DIAGNOSIS — E034 Atrophy of thyroid (acquired): Secondary | ICD-10-CM | POA: Diagnosis not present

## 2018-04-25 DIAGNOSIS — L700 Acne vulgaris: Secondary | ICD-10-CM | POA: Diagnosis not present

## 2018-04-25 DIAGNOSIS — I10 Essential (primary) hypertension: Secondary | ICD-10-CM | POA: Diagnosis not present

## 2018-04-25 DIAGNOSIS — E8881 Metabolic syndrome: Secondary | ICD-10-CM | POA: Diagnosis not present

## 2018-08-09 DIAGNOSIS — J301 Allergic rhinitis due to pollen: Secondary | ICD-10-CM | POA: Diagnosis not present

## 2018-08-09 DIAGNOSIS — J358 Other chronic diseases of tonsils and adenoids: Secondary | ICD-10-CM | POA: Diagnosis not present

## 2018-10-12 ENCOUNTER — Other Ambulatory Visit: Payer: Self-pay

## 2018-10-12 ENCOUNTER — Encounter: Payer: Self-pay | Admitting: Radiology

## 2018-10-12 ENCOUNTER — Emergency Department
Admission: EM | Admit: 2018-10-12 | Discharge: 2018-10-13 | Disposition: A | Discharge status: Home / Self Care | Payer: 59 | Attending: Student | Admitting: Student

## 2018-10-12 ENCOUNTER — Emergency Department: Payer: 59

## 2018-10-12 DIAGNOSIS — R55 Syncope and collapse: Secondary | ICD-10-CM | POA: Diagnosis not present

## 2018-10-12 DIAGNOSIS — R0789 Other chest pain: Secondary | ICD-10-CM | POA: Diagnosis not present

## 2018-10-12 DIAGNOSIS — Z7984 Long term (current) use of oral hypoglycemic drugs: Secondary | ICD-10-CM | POA: Insufficient documentation

## 2018-10-12 DIAGNOSIS — R079 Chest pain, unspecified: Secondary | ICD-10-CM

## 2018-10-12 DIAGNOSIS — Z79899 Other long term (current) drug therapy: Secondary | ICD-10-CM | POA: Insufficient documentation

## 2018-10-12 DIAGNOSIS — E119 Type 2 diabetes mellitus without complications: Secondary | ICD-10-CM | POA: Insufficient documentation

## 2018-10-12 DIAGNOSIS — R000 Tachycardia, unspecified: Secondary | ICD-10-CM | POA: Diagnosis not present

## 2018-10-12 LAB — URINALYSIS, COMPLETE (UACMP) WITH MICROSCOPIC
Bacteria, UA: NONE SEEN
Bilirubin Urine: NEGATIVE
Glucose, UA: >=500 mg/dL — AB
Ketones, ur: 5 mg/dL — AB
Leukocytes,Ua: NEGATIVE
Nitrite: NEGATIVE
Protein, ur: NEGATIVE mg/dL
Specific Gravity, Urine: 1.029 (ref 1.005–1.030)
pH: 6 (ref 5.0–8.0)

## 2018-10-12 LAB — POCT PREGNANCY, URINE: Preg Test, Ur: NEGATIVE

## 2018-10-12 LAB — CBC
HCT: 39.4 % (ref 36.0–46.0)
Hemoglobin: 13.1 g/dL (ref 12.0–15.0)
MCH: 29.7 pg (ref 26.0–34.0)
MCHC: 33.2 g/dL (ref 30.0–36.0)
MCV: 89.3 fL (ref 80.0–100.0)
Platelets: 235 10*3/uL (ref 150–400)
RBC: 4.41 MIL/uL (ref 3.87–5.11)
RDW: 12.9 % (ref 11.5–15.5)
WBC: 13.9 10*3/uL — ABNORMAL HIGH (ref 4.0–10.5)
nRBC: 0 % (ref 0.0–0.2)

## 2018-10-12 LAB — COMPREHENSIVE METABOLIC PANEL
ALT: 20 U/L (ref 0–44)
AST: 20 U/L (ref 15–41)
Albumin: 4.1 g/dL (ref 3.5–5.0)
Alkaline Phosphatase: 60 U/L (ref 38–126)
Anion gap: 7 (ref 5–15)
BUN: 14 mg/dL (ref 6–20)
CO2: 26 mmol/L (ref 22–32)
Calcium: 9.3 mg/dL (ref 8.9–10.3)
Chloride: 106 mmol/L (ref 98–111)
Creatinine, Ser: 0.72 mg/dL (ref 0.44–1.00)
GFR calc Af Amer: >60 mL/min (ref >60)
GFR calc non Af Amer: >60 mL/min (ref >60)
Glucose, Bld: 201 mg/dL — ABNORMAL HIGH (ref 70–99)
Potassium: 3.7 mmol/L (ref 3.5–5.1)
Sodium: 139 mmol/L (ref 135–145)
Total Bilirubin: 0.8 mg/dL (ref 0.3–1.2)
Total Protein: 8.2 g/dL — ABNORMAL HIGH (ref 6.5–8.1)

## 2018-10-12 LAB — TROPONIN I (HIGH SENSITIVITY): Troponin I (High Sensitivity): 4 ng/L (ref <18)

## 2018-10-12 MED ORDER — IOHEXOL 350 MG/ML SOLN
100.0000 mL | Freq: Once | INTRAVENOUS | Status: AC | PRN
  Administered 2018-10-12: 100 mL via INTRAVENOUS

## 2018-10-12 MED ORDER — SODIUM CHLORIDE 0.9 % IV BOLUS
1000.0000 mL | Freq: Once | INTRAVENOUS | Status: AC
  Administered 2018-10-12: 1000 mL via INTRAVENOUS

## 2018-10-12 MED ORDER — FENTANYL CITRATE (PF) 100 MCG/2ML IJ SOLN
50.0000 ug | Freq: Once | INTRAMUSCULAR | Status: AC
  Administered 2018-10-12: 50 ug via INTRAVENOUS
  Filled 2018-10-12: qty 2

## 2018-10-12 MED ORDER — ACETAMINOPHEN 500 MG PO TABS
1000.0000 mg | ORAL_TABLET | Freq: Once | ORAL | Status: AC
  Administered 2018-10-12: 1000 mg via ORAL

## 2018-10-12 MED ORDER — ACETAMINOPHEN 500 MG PO TABS
ORAL_TABLET | ORAL | Status: AC
  Filled 2018-10-12: qty 2

## 2018-10-12 NOTE — ED Triage Notes (Signed)
Pt in with co lower abd pain for 2 days, denies any dysuria. Pt also co mid chest pain for weeks states feels tight, no shob or cough. Pt denies any cardiac hx.

## 2018-10-12 NOTE — ED Notes (Signed)
Patient transported to CT 

## 2018-10-12 NOTE — Discharge Instructions (Addendum)
Thank you for letting us take care of you in the emergency department today.  ° °Please continue to take any regular, prescribed medications.  ° °Please follow up with: °- Your primary care doctor to review your ER visit and follow up on your symptoms.  ° °Please return to the ER for any new or worsening symptoms.  ° °

## 2018-10-12 NOTE — ED Provider Notes (Addendum)
[facility] Emergency Department Provider Note  ____________________________________________   First MD Initiated Contact with Patient 10/12/18 2207     (approximate)  I have reviewed the triage vital signs and the nursing notes.  History  Chief Complaint Chest Pain and Abdominal Pain    HPI Cheryl Carpenter is a 28 y.o. female with history of diabetes who presents to the emergency department for chest pain and questionable syncopal episode.  Patient states she was at work when her coworkers told her that she "seemed out of it" and were concerned that she lost consciousness for a few seconds.  The patient does not entirely recall this event.  No shaking or seizure-like activity described.  She does report chest pain that has been occurring in episodes over the last several weeks.  Feels "tight", with no identifiable inciting or alleviating components. No SOB, cough, or fever.  No personal or family history of DVT or PE, she states her d-dimer is "always elevated".   She also reports some mild lower abdominal pain.  No associated fevers, nausea, vomiting, urinary symptoms including dysuria or hematuria, vaginal bleeding or vaginal discharge.  No concern for pregnancy or STD.         Past Medical Hx Past Medical History:  Diagnosis Date  . Diabetes mellitus without complication [facility])     Problem List Patient Active Problem List   Diagnosis Date Noted  . Postpartum care following cesarean delivery 07/01/2017  . Postpartum hemorrhage 07/01/2017  . Postpartum anemia 07/01/2017  . Encounter for maternal care for low transverse scar from previous cesarean delivery 06/28/2017  . Obesity affecting pregnancy, antepartum 05/30/2017  . Pre-existing diabetes mellitus in pregnancy 12/13/2016  . Previous cesarean delivery affecting pregnancy, antepartum 12/13/2016  . Diabetes mellitus, type 2 ([place]) 11/10/2016  . Supervision of high risk pregnancy, antepartum, first  trimester 11/10/2016  . ALLERGIC RHINITIS 03/21/2010    Past Surgical Hx Past Surgical History:  Procedure Laterality Date  . CESAREAN SECTION  2010   failed VAVD  . CESAREAN SECTION N/A 06/28/2017   Procedure: CESAREAN SECTION, BTL;  Surgeon: Malachy Mood, MD;  Location: [facility] ORS;  Service: Obstetrics;  Laterality: N/A;  . FOOT SURGERY    . nexplanon removal  07/2016  . TUBAL LIGATION      Medications Prior to Admission medications   Medication Sig Start Date End Date Taking? Authorizing Provider  cetirizine (ZYRTEC) 10 MG tablet Take 10 mg by mouth daily as needed for allergies.    [provider]  citalopram (CELEXA) 20 MG tablet Take 1 tablet (20 mg total) by mouth daily. 08/09/17   Malachy Mood, MD  hydrOXYzine (ATARAX/VISTARIL) 25 MG tablet Take 1 tablet (25 mg total) by mouth every 6 (six) hours as needed for anxiety. 08/09/17   Malachy Mood, MD  ibuprofen (ADVIL,MOTRIN) 600 MG tablet Take 1 tablet (600 mg total) by mouth every 6 (six) hours as needed for mild pain or cramping. 07/01/17   Dalia Heading, CNM  metFORMIN (GLUCOPHAGE) 500 MG tablet Take 500 mg by mouth daily with breakfast.     [provider]  Prenatal Vit-Fe Fumarate-FA (MULTIVITAMIN-PRENATAL) 27-0.8 MG TABS tablet Take 1 tablet by mouth daily.     [provider]    Allergies Fish allergy, Peanut-containing drug products, Shellfish allergy, and Sulfa antibiotics  Family Hx Family History  Problem Relation Age of Onset  . Diabetes Father   . Heart failure Other     Social Hx Social  History   Tobacco Use  . Smoking status: Never Smoker  . Smokeless tobacco: Never Used  Substance Use Topics  . Alcohol use: No  . Drug use: No     Review of Systems  Constitutional: Negative for fever. Negative for chills. Eyes: Negative for visual changes. ENT: Negative for sore throat. Cardiovascular: + for chest pain. Respiratory: Negative for shortness of breath.  Gastrointestinal: + for abdominal pain. Negative for nausea. Negative for vomiting. Genitourinary: Negative for dysuria. Musculoskeletal: Negative for leg swelling. Skin: Negative for rash. Neurological: Negative for for headaches.   Physical Exam  Vital Signs: ED Triage Vitals  Enc Vitals Group     BP 10/12/18 2016 130/80     Pulse Rate 10/12/18 2016 99     Resp 10/12/18 2016 20     Temp 10/12/18 2016 98.8 F (37.1 C)     Temp Source 10/12/18 2016 Oral     SpO2 10/12/18 2016 100 %     Weight 10/12/18 2017 215 lb (97.5 kg)     Height 10/12/18 2017 5\' 5"  (1.651 m)     Head Circumference --      Peak Flow --      Pain Score 10/12/18 2016 8     Pain Loc --      Pain Edu? --      Excl. in GC? --     Constitutional: Alert and oriented.  Eyes: Conjunctivae clear. Sclera anicteric. Head: Normocephalic. Atraumatic. Nose: No congestion. No rhinorrhea. Mouth/Throat: Mucous membranes are moist.  Neck: No stridor.   Cardiovascular: HR low 100s (patient states this normal for her), regular rhythm. No murmurs. Extremities well perfused. Respiratory: Normal respiratory effort.  Lungs CTAB. Gastrointestinal: Soft and non-tender throughout. No distention.  Musculoskeletal: No lower extremity edema. Neurologic:  Normal speech and language. No gross focal neurologic deficits are appreciated.  Skin: Skin is warm, dry and intact. No rash noted. Psychiatric: Mood and affect are appropriate for situation.  EKG  Personally reviewed.   Rate: 101 Rhythm: sinus Axis: normal Intervals: WNL Non specific T wave changes in III No evidence of prolonged QTC, Brugada, or Wolff-Parkinson-White No STEMI    Radiology  CT PE ordered.   Procedures  Procedure(s) performed (including critical care):  Procedures   Initial Impression / Assessment and Plan / ED Course  28 y.o. female with a history of diabetes who presents to the ED for chest pain and a questionable syncopal episode.   Mildly tachycardic on exam to low 100s, though the patient states her heart rate is normally slightly elevated.  Lungs clear.  No evidence of respiratory distress.  Abdomen soft nontender.  No lower extremity swelling or edema.  Ddx: PE, arrhythmia, electrolyte abnormality, anemia  Plan: labs, EKG, CT PE imaging. Abdominal exam is reassuring, do not feel emergent imaging of this is indicated at this time. No vaginal discharge c/f PID. Will obtain urine.   EKG reveals mild tachycardia, but no acute ischemic changes or evidence of arrhythmia.  High-sensitivity troponin is negative.  Electrolytes without actionable derangements.  No evidence of UTI.  Urine pregnancy negative.  CT PE pending.  Awaiting CT PE, if negative, given remainder of work-up is unremarkable, anticipate discharge home.  Patient voices understanding is comfortable with this plan.  Final Clinical Impression(s) / ED Diagnosis  Final diagnoses:  Chest pain in adult       Note:  This document was prepared using Dragon voice recognition software and may include unintentional dictation errors.  Miguel AschoffMonks,  L., MD 10/13/18 [phone]

## 2018-11-13 ENCOUNTER — Ambulatory Visit: Payer: 59 | Admitting: Obstetrics & Gynecology

## 2018-12-06 ENCOUNTER — Telehealth: Payer: Self-pay | Admitting: Obstetrics & Gynecology

## 2018-12-06 ENCOUNTER — Other Ambulatory Visit: Payer: Self-pay

## 2018-12-06 ENCOUNTER — Other Ambulatory Visit (HOSPITAL_COMMUNITY)
Admission: RE | Admit: 2018-12-06 | Discharge: 2018-12-06 | Disposition: A | Discharge status: Home / Self Care | Payer: 59 | Source: Ambulatory Visit | Attending: Obstetrics & Gynecology | Admitting: Obstetrics & Gynecology

## 2018-12-06 ENCOUNTER — Encounter: Payer: Self-pay | Admitting: Obstetrics & Gynecology

## 2018-12-06 ENCOUNTER — Ambulatory Visit (INDEPENDENT_AMBULATORY_CARE_PROVIDER_SITE_OTHER): Payer: 59 | Admitting: Obstetrics & Gynecology

## 2018-12-06 VITALS — BP 120/80 | Ht 65.0 in | Wt 215.0 lb

## 2018-12-06 DIAGNOSIS — Z113 Encounter for screening for infections with a predominantly sexual mode of transmission: Secondary | ICD-10-CM

## 2018-12-06 DIAGNOSIS — Z01419 Encounter for gynecological examination (general) (routine) without abnormal findings: Secondary | ICD-10-CM | POA: Diagnosis not present

## 2018-12-06 DIAGNOSIS — Z124 Encounter for screening for malignant neoplasm of cervix: Secondary | ICD-10-CM | POA: Diagnosis not present

## 2018-12-06 DIAGNOSIS — N920 Excessive and frequent menstruation with regular cycle: Secondary | ICD-10-CM | POA: Insufficient documentation

## 2018-12-06 NOTE — Progress Notes (Signed)
HPI:      [name] is a 28 y.o. [phone] who LMP was Patient's last menstrual period was 11/25/2018., she presents today for her annual examination. The patient has no complaints today.  Periods are regular yet worse in intensity with 5-6 days heavy flow; uses tampons and pads; clots; mild cramping. The patient is sexually active. Her last pap: approximate date 2018 and was normal. The patient does perform self breast exams.  There is no notable family history of breast or ovarian cancer in her family.  The patient has regular exercise: yes.  The patient denies current symptoms of depression.    GYN History: Contraception: tubal ligation  PMHx: Past Medical History:  Diagnosis Date  . Diabetes mellitus without complication [facility])    Past Surgical History:  Procedure Laterality Date  . CESAREAN SECTION  2010   failed VAVD  . CESAREAN SECTION N/A 06/28/2017   Procedure: CESAREAN SECTION, BTL;  Surgeon: Vena Austria, MD;  Location: [facility] ORS;  Service: Obstetrics;  Laterality: N/A;  . FOOT SURGERY    . nexplanon removal  07/2016  . TUBAL LIGATION     Family History  Problem Relation Age of Onset  . Diabetes Father   . Heart failure Other    Social History   Tobacco Use  . Smoking status: Never Smoker  . Smokeless tobacco: Never Used  Substance Use Topics  . Alcohol use: No  . Drug use: No    Current Outpatient Medications:  .  cetirizine (ZYRTEC) 10 MG tablet, Take 10 mg by mouth daily as needed for allergies., Disp: , Rfl:  .  citalopram (CELEXA) 20 MG tablet, Take 1 tablet (20 mg total) by mouth daily., Disp: 30 tablet, Rfl: 2 .  hydrOXYzine (ATARAX/VISTARIL) 25 MG tablet, Take 1 tablet (25 mg total) by mouth every 6 (six) hours as needed for anxiety., Disp: 30 tablet, Rfl: 2 .  ibuprofen (ADVIL,MOTRIN) 600 MG tablet, Take 1 tablet (600 mg total) by mouth every 6 (six) hours as needed for mild pain or cramping. (Patient not taking: Reported on 12/06/2018), Disp:  30 tablet, Rfl: 1 .  metFORMIN (GLUCOPHAGE) 500 MG tablet, Take 500 mg by mouth daily with breakfast. , Disp: , Rfl:  .  Prenatal Vit-Fe Fumarate-FA (MULTIVITAMIN-PRENATAL) 27-0.8 MG TABS tablet, Take 1 tablet by mouth daily. , Disp: , Rfl:  Allergies: Fish allergy, Peanut-containing drug products, Shellfish allergy, and Sulfa antibiotics  Review of Systems  Constitutional: Negative for chills, fever and malaise/fatigue.  HENT: Negative for congestion, sinus pain and sore throat.   Eyes: Negative for blurred vision and pain.  Respiratory: Negative for cough and wheezing.   Cardiovascular: Negative for chest pain and leg swelling.  Gastrointestinal: Negative for abdominal pain, constipation, diarrhea, heartburn, nausea and vomiting.  Genitourinary: Negative for dysuria, frequency, hematuria and urgency.  Musculoskeletal: Negative for back pain, joint pain, myalgias and neck pain.  Skin: Negative for itching and rash.  Neurological: Negative for dizziness, tremors and weakness.  Endo/Heme/Allergies: Does not bruise/bleed easily.  Psychiatric/Behavioral: Negative for depression. The patient is not nervous/anxious and does not have insomnia.     Objective: BP 120/80   Ht 5\' 5"  (1.651 m)   Wt 215 lb (97.5 kg)   LMP 11/25/2018   BMI 35.78 kg/m   Filed Weights   12/06/18 0846  Weight: 215 lb (97.5 kg)   Body mass index is 35.78 kg/m. Physical Exam Constitutional:      General: She is not  in acute distress.    Appearance: She is well-developed.  Genitourinary:     Pelvic exam was performed with patient supine.     Vagina, uterus and rectum normal.     No lesions in the vagina.     No vaginal bleeding.     No cervical motion tenderness, friability, lesion or polyp.     Uterus is mobile.     Uterus is not enlarged.     No uterine mass detected.    Uterus is midaxial.     No right or left adnexal mass present.     Right adnexa not tender.     Left adnexa not tender.  HENT:      Head: Normocephalic and atraumatic. No laceration.     Right Ear: Hearing normal.     Left Ear: Hearing normal.     Mouth/Throat:     Pharynx: Uvula midline.  Eyes:     Pupils: Pupils are equal, round, and reactive to light.  Neck:     Musculoskeletal: Normal range of motion and neck supple.     Thyroid: No thyromegaly.  Cardiovascular:     Rate and Rhythm: Normal rate and regular rhythm.     Heart sounds: No murmur. No friction rub. No gallop.   Pulmonary:     Effort: Pulmonary effort is normal. No respiratory distress.     Breath sounds: Normal breath sounds. No wheezing.  Chest:     Breasts:        Right: No mass, skin change or tenderness.        Left: No mass, skin change or tenderness.  Abdominal:     General: Bowel sounds are normal. There is no distension.     Palpations: Abdomen is soft.     Tenderness: There is no abdominal tenderness. There is no rebound.  Musculoskeletal: Normal range of motion.  Neurological:     Mental Status: She is alert and oriented to person, place, and time.     Cranial Nerves: No cranial nerve deficit.  Skin:    General: Skin is warm and dry.  Psychiatric:        Judgment: Judgment normal.  Vitals signs reviewed.     Assessment:  ANNUAL EXAM 1. Women's annual routine gynecological examination   2. Screen for STD (sexually transmitted disease)   3. Screening for cervical cancer   4. Menorrhagia with regular cycle      Screening Plan:            1.  Cervical Screening-  Pap smear done today  2. Breast screening- Exam annually   3. Labs managed by PCP  4. Counseling for contraception: bilateral tubal ligation   5. Screen for STD (sexually transmitted disease) - Cytology - PAP - Cervicovaginal ancillary only - HEP, RPR, HIV Panel   6. Menorrhagia with regular cycle Treatment option for menorrhagia or menometrorrhagia discussed in great detail with the patient.  Options include hormonal therapy, IUD therapy such as Mirena,  D&C, Ablation, and Hysterectomy.  The pros and cons of each option discussed with patient. Pt requests Nexplanon, has done well w this in past To schedule    F/U  Return in about 1 week (around 12/13/2018) for Nexplanon.  Barnett Applebaum, MD, [facility], [facility] 12/06/2018  9:15 AM

## 2018-12-06 NOTE — Patient Instructions (Signed)
Etonogestrel implant What is this medicine? ETONOGESTREL (et oh noe JES trel) is a contraceptive (birth control) device. It is used to prevent pregnancy. It can be used for up to 3 years. This medicine may be used for other purposes; ask your health care provider or pharmacist if you have questions. COMMON BRAND NAME(S): Implanon, Nexplanon What should I tell my health care provider before I take this medicine? They need to know if you have any of these conditions:  abnormal vaginal bleeding  blood vessel disease or blood clots  breast, cervical, endometrial, ovarian, liver, or uterine cancer  diabetes  gallbladder disease  heart disease or recent heart attack  high blood pressure  high cholesterol or triglycerides  kidney disease  liver disease  migraine headaches  seizures  stroke  tobacco smoker  an unusual or allergic reaction to etonogestrel, anesthetics or antiseptics, other medicines, foods, dyes, or preservatives  pregnant or trying to get pregnant  breast-feeding How should I use this medicine? This device is inserted just under the skin on the inner side of your upper arm by a health care professional. Talk to your pediatrician regarding the use of this medicine in children. Special care may be needed. Overdosage: If you think you have taken too much of this medicine contact a poison control center or emergency room at once. NOTE: This medicine is only for you. Do not share this medicine with others. What if I miss a dose? This does not apply. What may interact with this medicine? Do not take this medicine with any of the following medications:  amprenavir  fosamprenavir This medicine may also interact with the following medications:  acitretin  aprepitant  armodafinil  bexarotene  bosentan  carbamazepine  certain medicines for fungal infections like fluconazole, ketoconazole, itraconazole and voriconazole  certain medicines to treat  hepatitis, HIV or AIDS  cyclosporine  felbamate  griseofulvin  lamotrigine  modafinil  oxcarbazepine  phenobarbital  phenytoin  primidone  rifabutin  rifampin  rifapentine  St. John's wort  topiramate This list may not describe all possible interactions. Give your health care provider a list of all the medicines, herbs, non-prescription drugs, or dietary supplements you use. Also tell them if you smoke, drink alcohol, or use illegal drugs. Some items may interact with your medicine. What should I watch for while using this medicine? This product does not protect you against HIV infection (AIDS) or other sexually transmitted diseases. You should be able to feel the implant by pressing your fingertips over the skin where it was inserted. Contact your doctor if you cannot feel the implant, and use a non-hormonal birth control method (such as condoms) until your doctor confirms that the implant is in place. Contact your doctor if you think that the implant may have broken or become bent while in your arm. You will receive a user card from your health care provider after the implant is inserted. The card is a record of the location of the implant in your upper arm and when it should be removed. Keep this card with your health records. What side effects may I notice from receiving this medicine? Side effects that you should report to your doctor or health care professional as soon as possible:  allergic reactions like skin rash, itching or hives, swelling of the face, lips, or tongue  breast lumps, breast tissue changes, or discharge  breathing problems  changes in emotions or moods  if you feel that the implant may have broken or   bent while in your arm  high blood pressure  pain, irritation, swelling, or bruising at the insertion site  scar at site of insertion  signs of infection at the insertion site such as fever, and skin redness, pain or discharge  signs and  symptoms of a blood clot such as breathing problems; changes in vision; chest pain; severe, sudden headache; pain, swelling, warmth in the leg; trouble speaking; sudden numbness or weakness of the face, arm or leg  signs and symptoms of liver injury like dark yellow or brown urine; general ill feeling or flu-like symptoms; light-colored stools; loss of appetite; nausea; right upper belly pain; unusually weak or tired; yellowing of the eyes or skin  unusual vaginal bleeding, discharge Side effects that usually do not require medical attention (report to your doctor or health care professional if they continue or are bothersome):  acne  breast pain or tenderness  headache  irregular menstrual bleeding  nausea This list may not describe all possible side effects. Call your doctor for medical advice about side effects. You may report side effects to FDA at 1-800-FDA-1088. Where should I keep my medicine? This drug is given in a hospital or clinic and will not be stored at home. NOTE: This sheet is a summary. It may not cover all possible information. If you have questions about this medicine, talk to your doctor, pharmacist, or health care provider.  2020 Elsevier/Gold Standard (2016-11-30 14:11:42)  

## 2018-12-06 NOTE — Telephone Encounter (Signed)
11/23 with [place] for nexplanon at 1120

## 2018-12-07 LAB — HEP, RPR, HIV PANEL
HIV Screen 4th Generation wRfx: NONREACTIVE
Hepatitis B Surface Ag: NEGATIVE
RPR Ser Ql: NONREACTIVE

## 2018-12-07 LAB — CERVICOVAGINAL ANCILLARY ONLY
Bacterial Vaginitis (gardnerella): POSITIVE — AB
Candida Glabrata: NEGATIVE
Candida Vaginitis: NEGATIVE
Comment: NEGATIVE
Comment: NEGATIVE
Comment: NEGATIVE

## 2018-12-07 LAB — CYTOLOGY - PAP
Chlamydia: NEGATIVE
Comment: NEGATIVE
Comment: NEGATIVE
Comment: NORMAL
Diagnosis: NEGATIVE
Neisseria Gonorrhea: NEGATIVE
Trichomonas: NEGATIVE

## 2018-12-07 NOTE — Telephone Encounter (Signed)
Noted. Will order to arrive by apt date/time. 

## 2018-12-08 ENCOUNTER — Other Ambulatory Visit: Payer: Self-pay | Admitting: Obstetrics & Gynecology

## 2018-12-08 MED ORDER — METRONIDAZOLE 0.75 % VA GEL: 1.0000 | Freq: Every day | VAGINAL | 0 refills | Status: AC

## 2018-12-08 NOTE — Progress Notes (Signed)
Let her know all tests negative except for BV; antibiotic has been called in

## 2018-12-08 NOTE — Progress Notes (Signed)
Pt aware.

## 2018-12-15 ENCOUNTER — Encounter: Payer: Self-pay | Admitting: Obstetrics & Gynecology

## 2018-12-15 ENCOUNTER — Other Ambulatory Visit: Payer: Self-pay | Admitting: Obstetrics & Gynecology

## 2018-12-15 MED ORDER — METRONIDAZOLE 500 MG PO TABS: 500.0000 mg | ORAL_TABLET | Freq: Two times a day (BID) | ORAL | 0 refills | Status: DC

## 2018-12-15 NOTE — Telephone Encounter (Signed)
Please advise 

## 2018-12-18 ENCOUNTER — Ambulatory Visit (INDEPENDENT_AMBULATORY_CARE_PROVIDER_SITE_OTHER): Payer: 59 | Admitting: Obstetrics & Gynecology

## 2018-12-18 ENCOUNTER — Encounter: Payer: Self-pay | Admitting: Obstetrics & Gynecology

## 2018-12-18 ENCOUNTER — Other Ambulatory Visit: Payer: Self-pay

## 2018-12-18 VITALS — BP 120/80 | Temp 97.7°F | Ht 65.0 in | Wt 211.0 lb

## 2018-12-18 DIAGNOSIS — Z30017 Encounter for initial prescription of implantable subdermal contraceptive: Secondary | ICD-10-CM

## 2018-12-18 MED ORDER — FLUCONAZOLE 150 MG PO TABS: 150.0000 mg | ORAL_TABLET | Freq: Once | ORAL | 1 refills | Status: AC

## 2018-12-18 NOTE — Progress Notes (Signed)
  Nexplanon Insertion  Patient given informed consent, signed copy in the chart, time out was performed. BTL for contraception.  Nexplanon for bleeding/period control. Appropriate time out taken.  Patient's LEFT arm was prepped and draped in the usual sterile fashion.. The ruler used to measure and mark insertion area.  Pt was prepped with betadine swab and then injected with 3 cc of 2% lidocaine with epinephrine. Nexplanon removed form packaging,  Device confirmed in needle, then inserted full length of needle and withdrawn per handbook instructions.  Pt insertion site covered with steri-strip and a bandage.   Minimal blood loss.  Pt tolerated the procedure well.   Diflucan called in for yeast infection sx's.  Barnett Applebaum, MD, [facility], [facility] 12/18/2018  11:12 AM

## 2018-12-18 NOTE — Patient Instructions (Signed)
Nexplanon Instructions After Insertion  Keep bandage clean and dry for 24 hours  May use ice/Tylenol/Ibuprofen for soreness or pain  If you develop fever, drainage or increased warmth from incision site-contact office immediately   

## 2018-12-27 ENCOUNTER — Emergency Department (HOSPITAL_COMMUNITY)
Admission: EM | Admit: 2018-12-27 | Discharge: 2018-12-28 | Disposition: A | Discharge status: Home / Self Care | Payer: 59 | Attending: Emergency Medicine | Admitting: Emergency Medicine

## 2018-12-27 ENCOUNTER — Encounter (HOSPITAL_COMMUNITY): Payer: Self-pay | Admitting: *Deleted

## 2018-12-27 ENCOUNTER — Other Ambulatory Visit: Payer: Self-pay

## 2018-12-27 DIAGNOSIS — Z91010 Allergy to peanuts: Secondary | ICD-10-CM | POA: Diagnosis not present

## 2018-12-27 DIAGNOSIS — E1165 Type 2 diabetes mellitus with hyperglycemia: Secondary | ICD-10-CM | POA: Diagnosis not present

## 2018-12-27 DIAGNOSIS — Z9119 Patient's noncompliance with other medical treatment and regimen: Secondary | ICD-10-CM | POA: Insufficient documentation

## 2018-12-27 DIAGNOSIS — B370 Candidal stomatitis: Secondary | ICD-10-CM | POA: Insufficient documentation

## 2018-12-27 DIAGNOSIS — N898 Other specified noninflammatory disorders of vagina: Secondary | ICD-10-CM | POA: Diagnosis present

## 2018-12-27 DIAGNOSIS — B3731 Acute candidiasis of vulva and vagina: Secondary | ICD-10-CM

## 2018-12-27 DIAGNOSIS — R739 Hyperglycemia, unspecified: Secondary | ICD-10-CM

## 2018-12-27 DIAGNOSIS — B373 Candidiasis of vulva and vagina: Secondary | ICD-10-CM | POA: Insufficient documentation

## 2018-12-27 LAB — CBC WITH DIFFERENTIAL/PLATELET
Abs Immature Granulocytes: 0.05 10*3/uL (ref 0.00–0.07)
Basophils Absolute: 0.1 10*3/uL (ref 0.0–0.1)
Basophils Relative: 0 %
Eosinophils Absolute: 0.3 10*3/uL (ref 0.0–0.5)
Eosinophils Relative: 2 %
HCT: 44.3 % (ref 36.0–46.0)
Hemoglobin: 14.9 g/dL (ref 12.0–15.0)
Immature Granulocytes: 0 %
Lymphocytes Relative: 23 %
Lymphs Abs: 2.8 10*3/uL (ref 0.7–4.0)
MCH: 30.3 pg (ref 26.0–34.0)
MCHC: 33.6 g/dL (ref 30.0–36.0)
MCV: 90 fL (ref 80.0–100.0)
Monocytes Absolute: 0.6 10*3/uL (ref 0.1–1.0)
Monocytes Relative: 5 %
Neutro Abs: 8.2 10*3/uL — ABNORMAL HIGH (ref 1.7–7.7)
Neutrophils Relative %: 70 %
Platelets: 244 10*3/uL (ref 150–400)
RBC: 4.92 MIL/uL (ref 3.87–5.11)
RDW: 12.9 % (ref 11.5–15.5)
WBC: 11.9 10*3/uL — ABNORMAL HIGH (ref 4.0–10.5)
nRBC: 0 % (ref 0.0–0.2)

## 2018-12-27 LAB — BASIC METABOLIC PANEL
Anion gap: 9 (ref 5–15)
BUN: 6 mg/dL (ref 6–20)
CO2: 25 mmol/L (ref 22–32)
Calcium: 9.2 mg/dL (ref 8.9–10.3)
Chloride: 97 mmol/L — ABNORMAL LOW (ref 98–111)
Creatinine, Ser: 0.75 mg/dL (ref 0.44–1.00)
GFR calc Af Amer: >60 mL/min (ref >60)
GFR calc non Af Amer: >60 mL/min (ref >60)
Glucose, Bld: 508 mg/dL (ref 70–99)
Potassium: 3.7 mmol/L (ref 3.5–5.1)
Sodium: 131 mmol/L — ABNORMAL LOW (ref 135–145)

## 2018-12-27 LAB — WET PREP, GENITAL
Clue Cells Wet Prep HPF POC: NONE SEEN
Sperm: NONE SEEN
Trich, Wet Prep: NONE SEEN
Yeast Wet Prep HPF POC: NONE SEEN

## 2018-12-27 LAB — CBG MONITORING, ED
Glucose-Capillary: 306 mg/dL — ABNORMAL HIGH (ref 70–99)
Glucose-Capillary: 477 mg/dL — ABNORMAL HIGH (ref 70–99)

## 2018-12-27 MED ORDER — SODIUM CHLORIDE 0.9 % IV BOLUS
1000.0000 mL | Freq: Once | INTRAVENOUS | Status: AC
  Administered 2018-12-27: 1000 mL via INTRAVENOUS

## 2018-12-27 NOTE — ED Notes (Signed)
Date and time results received: 12/27/18 2118  Test: Glucose Critical Value: 508  Name of Provider Notified: Rancour  Orders Received? Or Actions Taken?: N/A

## 2018-12-27 NOTE — ED Triage Notes (Addendum)
Pt c/o "thrush" to tongue area and yeast to vaginal area with"cottage cheese" discharge that started recently. Has tried diflucan with no improvement

## 2018-12-28 DIAGNOSIS — E1165 Type 2 diabetes mellitus with hyperglycemia: Secondary | ICD-10-CM | POA: Diagnosis not present

## 2018-12-28 DIAGNOSIS — B373 Candidiasis of vulva and vagina: Secondary | ICD-10-CM | POA: Diagnosis not present

## 2018-12-28 DIAGNOSIS — Z9119 Patient's noncompliance with other medical treatment and regimen: Secondary | ICD-10-CM | POA: Diagnosis not present

## 2018-12-28 DIAGNOSIS — B370 Candidal stomatitis: Secondary | ICD-10-CM | POA: Diagnosis not present

## 2018-12-28 DIAGNOSIS — E8881 Metabolic syndrome: Secondary | ICD-10-CM | POA: Diagnosis not present

## 2018-12-28 DIAGNOSIS — E119 Type 2 diabetes mellitus without complications: Secondary | ICD-10-CM | POA: Diagnosis not present

## 2018-12-28 DIAGNOSIS — Z91010 Allergy to peanuts: Secondary | ICD-10-CM | POA: Diagnosis not present

## 2018-12-28 MED ORDER — FLUCONAZOLE 100 MG PO TABS: 150.0000 mg | ORAL_TABLET | ORAL | 0 refills | Status: AC

## 2018-12-28 MED ORDER — MAGIC MOUTHWASH: 5.0000 mL | Freq: Three times a day (TID) | ORAL | 0 refills | Status: DC

## 2018-12-28 MED ORDER — MAGIC MOUTHWASH W/LIDOCAINE
5.0000 mL | ORAL | Status: AC
  Administered 2018-12-28: 5 mL via ORAL
  Filled 2018-12-28: qty 5

## 2018-12-28 MED ORDER — FLUCONAZOLE 150 MG PO TABS
150.0000 mg | ORAL_TABLET | Freq: Once | ORAL | Status: AC
  Administered 2018-12-28: 150 mg via ORAL
  Filled 2018-12-28: qty 1

## 2018-12-28 MED ORDER — FLUCONAZOLE 100 MG PO TABS: 100.0000 mg | ORAL_TABLET | Freq: Once | ORAL | Status: DC

## 2018-12-28 NOTE — ED Provider Notes (Signed)
[facility] EMERGENCY DEPARTMENT Provider Note   CSN: [id number] Arrival date & time: 12/27/18  1729     History   Chief Complaint Chief Complaint  Patient presents with  . Vaginitis    HPI Cheryl Carpenter is a 28 y.o. female.     HPI   28 year old female, with PMH of diabetes, presents with thrush and possible vaginal yeast infection.  Patient states that she does not take her Metformin because it makes her vomit.  She has not taken it for the last month.  She states that she has been drinking only fruit punch and then she does not monitor her diet.  She states over the last month she has had oral thrush and has tried Diflucan with no improvement in her symptoms.  She is having thick white vaginal discharge which she was concerned was a vaginal yeast infection.  She denies any abdominal pain, fevers, chills, nausea, vomiting.  She has not seen her primary care provider or GYN for this complaint.   Past Medical History:  Diagnosis Date  . Diabetes mellitus without complication [facility])     Patient Active Problem List   Diagnosis Date Noted  . Menorrhagia with regular cycle 12/06/2018  . Postpartum care following cesarean delivery 07/01/2017  . Postpartum hemorrhage 07/01/2017  . Postpartum anemia 07/01/2017  . Encounter for maternal care for low transverse scar from previous cesarean delivery 06/28/2017  . Obesity affecting pregnancy, antepartum 05/30/2017  . Pre-existing diabetes mellitus in pregnancy 12/13/2016  . Previous cesarean delivery affecting pregnancy, antepartum 12/13/2016  . Diabetes mellitus, type 2 ([place]) 11/10/2016  . Supervision of high risk pregnancy, antepartum, first trimester 11/10/2016  . ALLERGIC RHINITIS 03/21/2010    Past Surgical History:  Procedure Laterality Date  . CESAREAN SECTION  2010   failed VAVD  . CESAREAN SECTION N/A 06/28/2017   Procedure: CESAREAN SECTION, BTL;  Surgeon: Malachy Mood, MD;  Location: [facility] ORS;  Service:  Obstetrics;  Laterality: N/A;  . FOOT SURGERY    . nexplanon removal  07/2016  . TUBAL LIGATION       OB History    Gravida  2   Para  2   Term  2   Preterm      AB      Living  2     SAB      TAB      Ectopic      Multiple  0   Live Births  2            Home Medications    Prior to Admission medications   Medication Sig Start Date End Date Taking? Authorizing Provider  ibuprofen (ADVIL,MOTRIN) 600 MG tablet Take 1 tablet (600 mg total) by mouth every 6 (six) hours as needed for mild pain or cramping. 07/01/17  Yes Dalia Heading, CNM  fluconazole (DIFLUCAN) 100 MG tablet Take 1.5 tablets (150 mg total) by mouth every 3 (three) days for 2 doses. 12/28/18 01/01/19  Amair Shrout S, PA-C  magic mouthwash SOLN Take 5 mLs by mouth 3 (three) times daily. 12/28/18   Racheal Mathurin S, PA-C  metroNIDAZOLE (FLAGYL) 500 MG tablet Take 1 tablet (500 mg total) by mouth 2 (two) times daily. Patient not taking: Reported on 12/27/2018 12/15/18   Gae Dry, MD    Family History Family History  Problem Relation Age of Onset  . Diabetes Father   . Heart failure Other     Social History Social History  Tobacco Use  . Smoking status: Never Smoker  . Smokeless tobacco: Never Used  Substance Use Topics  . Alcohol use: No  . Drug use: No     Allergies   Fish allergy, Peanut-containing drug products, Shellfish allergy, and Sulfa antibiotics   Review of Systems Review of Systems  Constitutional: Negative for chills and fever.  HENT:       Thrush  Respiratory: Negative for shortness of breath.   Cardiovascular: Negative for chest pain.  Gastrointestinal: Negative for abdominal pain, nausea and vomiting.  Genitourinary: Positive for vaginal discharge. Negative for dysuria and hematuria.     Physical Exam Updated Vital Signs BP 130/90 (BP Location: Left Arm)   Pulse 90   Temp 98.2 F (36.8 C) (Oral)   Resp 18   Ht 5\' 5"  (1.651 m)   Wt 90.7 kg    LMP 11/27/2018   SpO2 100%   BMI 33.28 kg/m   Physical Exam Vitals signs and nursing note reviewed. Exam conducted with a chaperone present.  Constitutional:      Appearance: She is well-developed.  HENT:     Head: Normocephalic and atraumatic.     Mouth/Throat:     Tongue: Lesions (consistent with thrush) present.     Pharynx: Oropharynx is clear. Uvula midline.  Eyes:     Conjunctiva/sclera: Conjunctivae normal.  Neck:     Musculoskeletal: Neck supple.  Cardiovascular:     Rate and Rhythm: Normal rate and regular rhythm.     Heart sounds: Normal heart sounds. No murmur.  Pulmonary:     Effort: Pulmonary effort is normal. No respiratory distress.     Breath sounds: Normal breath sounds. No wheezing or rales.  Abdominal:     General: Bowel sounds are normal. There is no distension.     Palpations: Abdomen is soft.     Tenderness: There is no abdominal tenderness.  Genitourinary:    Vagina: Vaginal discharge (thick white discharge) present.     Cervix: No cervical motion tenderness.     Uterus: Normal.      Adnexa: Right adnexa normal and left adnexa normal.     Comments: White discharge noted around the introitus. Musculoskeletal: Normal range of motion.        General: No tenderness or deformity.  Skin:    General: Skin is warm and dry.     Findings: No erythema or rash.  Neurological:     Mental Status: She is alert and oriented to person, place, and time.  Psychiatric:        Behavior: Behavior normal.      ED Treatments / Results  Labs (all labs ordered are listed, but only abnormal results are displayed) Labs Reviewed  WET PREP, GENITAL - Abnormal; Notable for the following components:      Result Value   WBC, Wet Prep HPF POC RARE (*)    All other components within normal limits  CBC WITH DIFFERENTIAL/PLATELET - Abnormal; Notable for the following components:   WBC 11.9 (*)    Neutro Abs 8.2 (*)    All other components within normal limits  BASIC  METABOLIC PANEL - Abnormal; Notable for the following components:   Sodium 131 (*)    Chloride 97 (*)    Glucose, Bld 508 (*)    All other components within normal limits  CBG MONITORING, ED - Abnormal; Notable for the following components:   Glucose-Capillary 477 (*)    All other components within normal limits  CBG MONITORING, ED - Abnormal; Notable for the following components:   Glucose-Capillary 306 (*)    All other components within normal limits  GC/CHLAMYDIA PROBE AMP ([place]) NOT AT [facility]    EKG None  Radiology No results found.  Procedures Procedures (including critical care time)  Medications Ordered in ED Medications  sodium chloride 0.9 % bolus 1,000 mL (0 mLs Intravenous Stopped 12/27/18 2340)  sodium chloride 0.9 % bolus 1,000 mL (0 mLs Intravenous Stopped 12/27/18 2341)  magic mouthwash w/lidocaine (5 mLs Oral Given 12/28/18 0041)  fluconazole (DIFLUCAN) tablet 150 mg (150 mg Oral Given 12/28/18 0041)     Initial Impression / Assessment and Plan / ED Course  I have reviewed the triage vital signs and the nursing notes.  Pertinent labs & imaging results that were available during my care of the patient were reviewed by me and considered in my medical decision making (see chart for details).        Patient presented with complaints of vaginal Candida and thrush.  Her physical exam is consistent with vaginal Candida and thrush.  Her abdomen is soft and nontender to palpation.  Her vital signs are stable.  Patient was noted to be hyperglycemic.  There are no signs or symptoms of DKA.  Patient has a normal anion gap and bicarb.  Patient was given 2 L of fluid and her sugar improved to 306.  Patient's hyperglycemia is likely secondary to being noncompliant with her medication.  I discussed possibly starting patient on medication including Metformin.  However patient states she has an appointment to see her primary care provider on Thursday 12/3.  She states she will  follow up with them about starting medication.  She does not wish to be started on something in the ER.  Instructed her to monitor her diet and drink water instead of fruit punch.  Patient expressed understanding.  Regarding her thrush I have written a prescription for Magic mouthwash.  For her vaginal Candida I have written Diflucan.  However I informed patient that these will likely not improve until she gets her sugars under control.  Patient ready stable for discharge.   At this time there does not appear to be any evidence of an acute emergency medical condition and the patient appears stable for discharge with appropriate outpatient follow up.Diagnosis was discussed with patient who verbalizes understanding and is agreeable to discharge.   Final Clinical Impressions(s) / ED Diagnoses   Final diagnoses:  Hyperglycemia  Vaginal candidiasis  Thrush    ED Discharge Orders         Ordered    fluconazole (DIFLUCAN) 100 MG tablet  every 72 hours     12/28/18 0011    magic mouthwash SOLN  3 times daily     12/28/18 0012           Clayborne Artist, PA-C 12/28/18 0110    Glynn Octave, MD 12/28/18 [phone]

## 2018-12-28 NOTE — Discharge Instructions (Signed)
Keep your follow-up appointment with your primary care provider on Thursday.  You need to be restarted on medication for your diabetes.  Please monitor your diet and reduce your sugar intake.  May start Diflucan in 72 hours and take every 72h for 2 more doses.  Use Magic mouthwash as prescribed.  Return to the ED immediately for new or worsening symptoms or concerns, such as vomiting, abdominal pain, fevers, chest pain, shortness of breath or any concerns at all.

## 2018-12-29 LAB — GC/CHLAMYDIA PROBE AMP (~~LOC~~) NOT AT ARMC
Chlamydia: NEGATIVE
Neisseria Gonorrhea: NEGATIVE

## 2019-01-03 DIAGNOSIS — E8881 Metabolic syndrome: Secondary | ICD-10-CM | POA: Diagnosis not present

## 2019-01-03 DIAGNOSIS — G479 Sleep disorder, unspecified: Secondary | ICD-10-CM | POA: Diagnosis not present

## 2019-01-03 DIAGNOSIS — L700 Acne vulgaris: Secondary | ICD-10-CM | POA: Diagnosis not present

## 2019-01-03 DIAGNOSIS — E119 Type 2 diabetes mellitus without complications: Secondary | ICD-10-CM | POA: Diagnosis not present

## 2019-02-16 ENCOUNTER — Encounter: Payer: Self-pay | Admitting: Obstetrics and Gynecology

## 2019-02-16 ENCOUNTER — Ambulatory Visit (INDEPENDENT_AMBULATORY_CARE_PROVIDER_SITE_OTHER): Payer: PRIVATE HEALTH INSURANCE | Admitting: Obstetrics and Gynecology

## 2019-02-16 ENCOUNTER — Other Ambulatory Visit: Payer: Self-pay

## 2019-02-16 VITALS — BP 124/78 | Ht 65.0 in | Wt 212.0 lb

## 2019-02-16 DIAGNOSIS — Z3046 Encounter for surveillance of implantable subdermal contraceptive: Secondary | ICD-10-CM | POA: Diagnosis not present

## 2019-02-16 NOTE — Progress Notes (Signed)
  GYNECOLOGY PROCEDURE NOTE  Nexplanon removal discussed in detail.  Risks of infection, bleeding, nerve injury all reviewed.  Patient understands risks and desires to proceed.  Verbal consent obtained.  Patient is certain she wants the Nexplanon removed.  All questions answered.  Procedure: Patient placed in dorsal supine with left arm above head, elbow flexed at 90 degrees, arm resting on examination table.  Nexplanon identified without problems.  Betadine scrub x 2.  Two ml of 1% lidocaine injected under Nexplanondevice without problems.  Sterile gloves applied.  Small 0.5cm incision made at distal tip of Nexplanon device with 11 blade scalpel.  Nexplanon brought to incision and grasped with a small kelly clamp.  Nexplanon removed intact without problems.  Pressure applied to incision.  Hemostasis obtained.  Steri-strips applied, followed by bandage and compression dressing.  Patient tolerated procedure well.  No complications.   Assessment: 29 y.o. year old female now s/p uncomplicated Nexplanon removal.  Plan: 1.  Patient given post procedure precautions and asked to call for fever, chills, redness or drainage from her incision, bleeding from incision.  She understands she will likely have a small bruise near site of removal and can remove bandage tomorrow and steri-strips in approximately 1 week.  2) Contraception: already status post BTL  Thomasene Mohair, MD, [facility], [facility] 02/16/2019 2:41 PM

## 2019-04-12 NOTE — Telephone Encounter (Signed)
Nexplanon rcvd/charged 12/18/2018

## 2019-06-05 ENCOUNTER — Telehealth: Payer: Self-pay

## 2019-06-05 NOTE — Telephone Encounter (Deleted)
[facility] Night - Client Nonclinical Telephone Record  [facility] Client [facility] Night - Client Client Site [facility] - Night Physician Gweneth Dimitri- MD Contact Type Call Who Is Calling Patient / Member / Family / Caregiver Caller Name Mianna Iezzi Caller Phone Number [phone] Patient Name Cheryl Carpenter Patient DOB 02-20-1989 Call Type Message Only Information Provided Reason for Call Request to Schedule Office Appointment Initial Comment The caller needs a new pt office visit. Additional Comment Disp. Time Disposition Final User 06/04/2019 6:16:01 PM General Information Provided Yes Majel Homer Call Closed By: Majel Homer Transaction Date/Time: 06/04/2019 6:13:34 PM (ET)

## 2019-06-12 NOTE — Telephone Encounter (Deleted)
Disregard below note. Pt already has a PCP.

## 2019-07-04 NOTE — Telephone Encounter (Signed)
Sent to front office

## 2019-07-16 ENCOUNTER — Telehealth: Payer: 59 | Admitting: Family

## 2019-07-16 ENCOUNTER — Other Ambulatory Visit: Payer: Self-pay | Admitting: Internal Medicine

## 2019-07-16 DIAGNOSIS — L732 Hidradenitis suppurativa: Secondary | ICD-10-CM

## 2019-07-16 MED ORDER — CLINDAMYCIN HCL 300 MG PO CAPS: 300.0000 mg | ORAL_CAPSULE | Freq: Three times a day (TID) | ORAL | 0 refills | Status: DC

## 2019-07-16 MED ORDER — JANUVIA 100 MG PO TABS: 100.0000 mg | ORAL_TABLET | Freq: Every day | ORAL | 3 refills | Status: DC

## 2019-07-16 NOTE — Progress Notes (Signed)
E Visit for Cellulitis  We are sorry that you are not feeling well. Here is how we plan to help!  Based on what you shared with me it looks like you have cellulitis.  Cellulitis looks like areas of skin redness, swelling, and warmth; it develops as a result of bacteria entering under the skin. Little red spots and/or bleeding can be seen in skin, and tiny surface sacs containing fluid can occur. Fever can be present. Cellulitis is almost always on one side of a body, and the lower limbs are the most common site of involvement.   I have prescribed:  Clindamycin 450 mg take one by mouth three times a day for 5 days  HOME CARE:  . Take your medications as ordered and take all of them, even if the skin irritation appears to be healing.   GET HELP RIGHT AWAY IF:  . Symptoms that don't begin to go away within 48 hours. . Severe redness persists or worsens . If the area turns color, spreads or swells. . If it blisters and opens, develops yellow-brown crust or bleeds. . You develop a fever or chills. . If the pain increases or becomes unbearable.  . Are unable to keep fluids and food down.  MAKE SURE YOU    Understand these instructions.  Will watch your condition.  Will get help right away if you are not doing well or get worse.  Thank you for choosing an e-visit. Your e-visit answers were reviewed by a board certified advanced clinical practitioner to complete your personal care plan. Depending upon the condition, your plan could have included both over the counter or prescription medications. Please review your pharmacy choice. Make sure the pharmacy is open so you can pick up prescription now. If there is a problem, you may contact your provider through [employer] and have the prescription routed to another pharmacy. Your safety is important to [place]. If you have drug allergies check your prescription carefully.  For the next 24 hours you can use MyChart to ask questions about  today's visit, request a non-urgent call back, or ask for a work or school excuse. You will get an email in the next two days asking about your experience. I hope that your e-visit has been valuable and will speed your recovery.  Greater than 5 minutes, yet less than 10 minutes of time have been spent researching, coordinating, and implementing care for this patient today.  Thank you for the details you included in the comment boxes. Those details are very helpful in determining the best course of treatment for you and help [place] to provide the best care.

## 2019-07-31 ENCOUNTER — Telehealth: Payer: Self-pay | Admitting: Emergency Medicine

## 2019-07-31 DIAGNOSIS — N760 Acute vaginitis: Secondary | ICD-10-CM

## 2019-07-31 MED ORDER — FLUCONAZOLE 150 MG PO TABS: 150.0000 mg | ORAL_TABLET | Freq: Once | ORAL | 0 refills | Status: AC

## 2019-07-31 MED ORDER — KETOCONAZOLE 2 % EX CREA: 1.0000 "application " | TOPICAL_CREAM | Freq: Every day | CUTANEOUS | 0 refills | Status: DC

## 2019-07-31 NOTE — Progress Notes (Signed)
We are sorry that you are not feeling well. Here is how we plan to help! Based on what you shared with me it looks like you: May have a yeast vaginosis  Vaginosis is an inflammation of the vagina that can result in discharge, itching and pain. The cause is usually a change in the normal balance of vaginal bacteria or an infection. Vaginosis can also result from reduced estrogen levels after menopause.  The most common causes of vaginosis are:   Bacterial vaginosis which results from an overgrowth of one on several organisms that are normally present in your vagina.   Yeast infections which are caused by a naturally occurring fungus called candida.   Vaginal atrophy (atrophic vaginosis) which results from the thinning of the vagina from reduced estrogen levels after menopause.   Trichomoniasis which is caused by a parasite and is commonly transmitted by sexual intercourse.  Factors that increase your risk of developing vaginosis include: Marland Kitchen Medications, such as antibiotics and steroids . Uncontrolled diabetes . Use of hygiene products such as bubble bath, vaginal spray or vaginal deodorant . Douching . Wearing damp or tight-fitting clothing . Using an intrauterine device (IUD) for birth control . Hormonal changes, such as those associated with pregnancy, birth control pills or menopause . Sexual activity . Having a sexually transmitted infection  Your treatment plan is A single Diflucan (fluconazole) 150mg  tablet once.  I have electronically sent this prescription into the pharmacy that you have chosen. I have also rendered Ketoconazole cream 2 % once daily for 2 weeks.  Be sure to take all of the medication as directed. Stop taking any medication if you develop a rash, tongue swelling or shortness of breath. Mothers who are breast feeding should consider pumping and discarding their breast milk while on these antibiotics. However, there is no consensus that infant exposure at these doses  would be harmful.  Remember that medication creams can weaken latex condoms.   HOME CARE:  Good hygiene may prevent some types of vaginosis from recurring and may relieve some symptoms:  . Avoid baths, hot tubs and whirlpool spas. Rinse soap from your outer genital area after a shower, and dry the area well to prevent irritation. Don't use scented or harsh soaps, such as those with deodorant or antibacterial action. [place] Avoid irritants. These include scented tampons and pads. . Wipe from front to back after using the toilet. Doing so avoids spreading fecal bacteria to your vagina.  Other things that may help prevent vaginosis include:  Marland Kitchen Don't douche. Your vagina doesn't require cleansing other than normal bathing. Repetitive douching disrupts the normal organisms that reside in the vagina and can actually increase your risk of vaginal infection. Douching won't clear up a vaginal infection. . Use a latex condom. Both female and female latex condoms may help you avoid infections spread by sexual contact. . Wear cotton underwear. Also wear pantyhose with a cotton crotch. If you feel comfortable without it, skip wearing underwear to bed. Yeast thrives in [place] Your symptoms should improve in the next day or two.  GET HELP RIGHT AWAY IF:  . You have pain in your lower abdomen ( pelvic area or over your ovaries) . You develop nausea or vomiting . You develop a fever . Your discharge changes or worsens . You have persistent pain with intercourse . You develop shortness of breath, a rapid pulse, or you faint.  These symptoms could be signs of problems or infections that need to  be evaluated by a medical provider now.  MAKE SURE YOU    Understand these instructions.  Will watch your condition.  Will get help right away if you are not doing well or get worse.  Your e-visit answers were reviewed by a board certified advanced clinical practitioner to complete your personal  care plan. Depending upon the condition, your plan could have included both over the counter or prescription medications. Please review your pharmacy choice to make sure that you have chosen a pharmacy that is open for you to pick up any needed prescription, Your safety is important to [place]. If you have drug allergies check your prescription carefully.   You can use My Chart to ask questions about today's visit, request a non-urgent call back, or ask for a work or school excuse for 24 hours related to this e-Visit. If it has been greater than 24 hours you will need to follow up with your provider, or enter a new e-Visit to address those concerns. You will get a MyChart message within the next two days asking about your experience. I hope that your e-visit has been valuable and will speed your recovery.  **Please do not respond to this message unless you have follow up questions.** Greater than 5 but less than 10 minutes spent researching, coordinating, and implementing care for this patient today

## 2019-08-06 ENCOUNTER — Other Ambulatory Visit: Payer: Self-pay

## 2019-08-06 ENCOUNTER — Encounter: Payer: Self-pay | Admitting: Emergency Medicine

## 2019-08-06 DIAGNOSIS — Y9289 Other specified places as the place of occurrence of the external cause: Secondary | ICD-10-CM | POA: Insufficient documentation

## 2019-08-06 DIAGNOSIS — Y999 Unspecified external cause status: Secondary | ICD-10-CM | POA: Insufficient documentation

## 2019-08-06 DIAGNOSIS — Y9389 Activity, other specified: Secondary | ICD-10-CM | POA: Insufficient documentation

## 2019-08-06 DIAGNOSIS — W57XXXA Bitten or stung by nonvenomous insect and other nonvenomous arthropods, initial encounter: Secondary | ICD-10-CM | POA: Insufficient documentation

## 2019-08-06 DIAGNOSIS — S40861A Insect bite (nonvenomous) of right upper arm, initial encounter: Secondary | ICD-10-CM | POA: Insufficient documentation

## 2019-08-06 NOTE — ED Triage Notes (Signed)
Patient ambulatory to triage with steady gait, without difficulty or distress noted; pt reports yellow jacket sting to rt upper arm on Saturday; now with swelling and redness of rt upper arm

## 2019-08-07 ENCOUNTER — Emergency Department
Admission: EM | Admit: 2019-08-07 | Discharge: 2019-08-07 | Disposition: A | Discharge status: Home / Self Care | Payer: Self-pay | Attending: Emergency Medicine | Admitting: Emergency Medicine

## 2019-08-07 ENCOUNTER — Telehealth: Payer: Medicaid Other | Admitting: Emergency Medicine

## 2019-08-07 ENCOUNTER — Ambulatory Visit: Payer: 59 | Admitting: Family Medicine

## 2019-08-07 DIAGNOSIS — W57XXXA Bitten or stung by nonvenomous insect and other nonvenomous arthropods, initial encounter: Secondary | ICD-10-CM

## 2019-08-07 DIAGNOSIS — S40869A Insect bite (nonvenomous) of unspecified upper arm, initial encounter: Secondary | ICD-10-CM

## 2019-08-07 LAB — CBC WITH DIFFERENTIAL/PLATELET
Abs Immature Granulocytes: 0.03 10*3/uL (ref 0.00–0.07)
Basophils Absolute: 0 10*3/uL (ref 0.0–0.1)
Basophils Relative: 0 %
Eosinophils Absolute: 0.3 10*3/uL (ref 0.0–0.5)
Eosinophils Relative: 3 %
HCT: 39.5 % (ref 36.0–46.0)
Hemoglobin: 13.1 g/dL (ref 12.0–15.0)
Immature Granulocytes: 0 %
Lymphocytes Relative: 23 %
Lymphs Abs: 2.5 10*3/uL (ref 0.7–4.0)
MCH: 30.2 pg (ref 26.0–34.0)
MCHC: 33.2 g/dL (ref 30.0–36.0)
MCV: 91 fL (ref 80.0–100.0)
Monocytes Absolute: 0.7 10*3/uL (ref 0.1–1.0)
Monocytes Relative: 6 %
Neutro Abs: 7.7 10*3/uL (ref 1.7–7.7)
Neutrophils Relative %: 68 %
Platelets: 244 10*3/uL (ref 150–400)
RBC: 4.34 MIL/uL (ref 3.87–5.11)
RDW: 12.8 % (ref 11.5–15.5)
WBC: 11.2 10*3/uL — ABNORMAL HIGH (ref 4.0–10.5)
nRBC: 0 % (ref 0.0–0.2)

## 2019-08-07 LAB — COMPREHENSIVE METABOLIC PANEL
ALT: 21 U/L (ref 0–44)
AST: 16 U/L (ref 15–41)
Albumin: 4.1 g/dL (ref 3.5–5.0)
Alkaline Phosphatase: 44 U/L (ref 38–126)
Anion gap: 7 (ref 5–15)
BUN: 12 mg/dL (ref 6–20)
CO2: 27 mmol/L (ref 22–32)
Calcium: 9.2 mg/dL (ref 8.9–10.3)
Chloride: 105 mmol/L (ref 98–111)
Creatinine, Ser: 0.88 mg/dL (ref 0.44–1.00)
GFR calc Af Amer: >60 mL/min (ref >60)
GFR calc non Af Amer: >60 mL/min (ref >60)
Glucose, Bld: 139 mg/dL — ABNORMAL HIGH (ref 70–99)
Potassium: 3.9 mmol/L (ref 3.5–5.1)
Sodium: 139 mmol/L (ref 135–145)
Total Bilirubin: 0.7 mg/dL (ref 0.3–1.2)
Total Protein: 8.3 g/dL — ABNORMAL HIGH (ref 6.5–8.1)

## 2019-08-07 LAB — LACTIC ACID, PLASMA: Lactic Acid, Venous: 0.6 mmol/L (ref 0.5–1.9)

## 2019-08-07 MED ORDER — PREDNISONE 20 MG PO TABS: 40.0000 mg | ORAL_TABLET | Freq: Every day | ORAL | 0 refills | Status: AC

## 2019-08-07 NOTE — ED Notes (Signed)
Cannot find pt for reassessment. 

## 2019-08-07 NOTE — Progress Notes (Signed)
E Visit for Insect Sting  Thank you for describing the insect sting for [place].  Here is how we plan to help!  Based on the information you have shared it looks like you have:  An uncomplicated insect sting and can be closely followed using the instructions in your care plan. We will treat with a short course of prednisone.   The 2 greatest risks from insect stings are allergic reaction, which can be fatal in some people and infection, which is more common and less serious.  Bees, wasps, yellow jackets, and hornets belong to a class of insects called Hymenoptera.  Most insect stings cause only minor discomfort.  Stings can happen anywhere on the body and can be painful.  Most stings are from honey bees or yellow jackets.  Fire ants can sting multiple times.  The sites of the stings are more likely to become infected.    I have sent in prednisone 40 mg by mouth daily for 5 days to the pharmacy you selected.  Please make sure that you selected a pharmacy that is open now.  What can be used to prevent Insect Stings?   Insect repellant with at least 20% DEET.    Wearing long pants and shirts with socks and shoes.    Wear dark or drab-colored clothes rather than bright colors.    Avoid using perfumes and hair sprays; these attract insects.  HOME CARE ADVICE:  1. Stinger removal:  The stinger looks like a tiny black dot in the sting.  Use a fingernail, credit card edge, or knife-edge to scrape it off.  Don't pull it out because it squeezes out more venom.  If the stinger is below the skin surface, leave it alone.  It will be shed with normal skin healing. 2. Use cold compresses to the area of the sting for 10-20 minutes.  You may repeat this as needed to relieve symptoms of pain and swelling. 3.  For pain relief, take acetominophen 650 mg 4-6 hours as needed or ibuprofen 400 mg every 6-8 hours as needed or naproxen 250-500 mg every 12 hours as needed. 4.  You can also use  hydrocortisone cream 0.5% or 1% up to 4 times daily as needed for itching. 5.  If the sting becomes very itchy, take Benadryl 25-50 mg, follow directions on box. 6.  Wash the area 2-3 times daily with antibacterial soap and warm water. 7. Call your Doctor if:  Fever, a severe headache, or rash occur in the next 2 weeks.  Sting area begins to look infected.  Redness and swelling worsens after home treatment.  Your current symptoms become worse.    MAKE SURE YOU:   Understand these instructions.  Will watch your condition.  Will get help right away if you are not doing well or get worse.  Thank you for choosing an e-visit. Your e-visit answers were reviewed by a board certified advanced clinical practitioner to complete your personal care plan. Depending upon the condition, your plan could have included both over the counter or prescription medications. Please review your pharmacy choice. Be sure that the pharmacy you have chosen is open so that you can pick up your prescription now.  If there is a problem you may message your provider in MyChart to have the prescription routed to another pharmacy. Your safety is important to [place]. If you have drug allergies check your prescription carefully.  For the next 24 hours, you can use MyChart to ask questions  about today's visit, request a non-urgent call back, or ask for a work or school excuse from your e-visit provider. You will get an email in the next two days asking about your experience. I hope that your e-visit has been valuable and will speed your recovery.  I spent 10 minutes reviewing patient's previous records.

## 2019-08-07 NOTE — ED Notes (Signed)
No answer for reassessment, cannot find pt

## 2019-08-07 NOTE — ED Notes (Signed)
No answer when called several times from lobby 

## 2019-10-09 ENCOUNTER — Encounter: Payer: Self-pay | Admitting: Internal Medicine

## 2019-10-09 ENCOUNTER — Ambulatory Visit (INDEPENDENT_AMBULATORY_CARE_PROVIDER_SITE_OTHER): Payer: No Typology Code available for payment source | Admitting: Internal Medicine

## 2019-10-09 ENCOUNTER — Other Ambulatory Visit: Payer: Self-pay

## 2019-10-09 VITALS — BP 124/90 | HR 93 | Ht 65.0 in | Wt 222.3 lb

## 2019-10-09 DIAGNOSIS — E119 Type 2 diabetes mellitus without complications: Secondary | ICD-10-CM

## 2019-10-09 DIAGNOSIS — Z0000 Encounter for general adult medical examination without abnormal findings: Secondary | ICD-10-CM | POA: Diagnosis not present

## 2019-10-09 DIAGNOSIS — E669 Obesity, unspecified: Secondary | ICD-10-CM | POA: Diagnosis not present

## 2019-10-09 LAB — GLUCOSE, POCT (MANUAL RESULT ENTRY): POC Glucose: 205 mg/dl — AB (ref 70–99)

## 2019-10-09 MED ORDER — ROSUVASTATIN CALCIUM 10 MG PO TABS: 10.0000 mg | ORAL_TABLET | Freq: Every day | ORAL | 3 refills | Status: DC

## 2019-10-09 MED ORDER — PEN NEEDLES 33G X 4 MM MISC: 1.0000 | Freq: Every day | 6 refills | Status: DC

## 2019-10-09 MED ORDER — LISINOPRIL 10 MG PO TABS: 10.0000 mg | ORAL_TABLET | Freq: Every day | ORAL | 3 refills | Status: DC

## 2019-10-09 MED ORDER — INSULIN DEGLUDEC 100 UNIT/ML ~~LOC~~ SOPN: 50.0000 [IU] | PEN_INJECTOR | Freq: Every day | SUBCUTANEOUS | Status: AC

## 2019-10-09 MED ORDER — FREESTYLE LIBRE 2 SENSOR MISC: 1.0000 | 6 refills | Status: DC

## 2019-10-09 NOTE — Assessment & Plan Note (Signed)
-   The patient's blood sugar is not under control on [place]. - The patient will change the current treatment regimen to increase the Tresiba to 50 units subcutaneously daily.  - I encouraged the patient to regularly check blood sugar.  - I encouraged the patient to monitor diet. I encouraged the patient to eat low-carb and low-sugar to help prevent blood sugar spikes.  - I encouraged the patient to continue following their prescribed treatment plan for diabetes - I informed the patient to get help if blood sugar drops below 54mg /dL, or if suddenly have trouble thinking clearly or breathing.

## 2019-10-09 NOTE — Addendum Note (Signed)
Addended by: Garnet Sierras R on: 10/09/2019 12:06 PM   Modules accepted: Orders

## 2019-10-09 NOTE — Patient Instructions (Signed)

## 2019-10-09 NOTE — Assessment & Plan Note (Signed)
Pt's main problem at the present time is obesity and uncontrolled diabetes. She was advised to make every effort to lose weight. We'll increase her Tresiba to 50 units subcutaneously daily. I also started her on Statin and Ace Inhibitor. We will check her for albumin in the urine.

## 2019-10-09 NOTE — Assessment & Plan Note (Signed)
-   I encouraged the patient to lose weight.  - I educated them on making healthy dietary choices including eating more fruits and vegetables and less fried foods. - I encouraged the patient to exercise more, and educated on the benefits of exercise including weight loss, diabetes management, and hypertension prevention.   

## 2019-10-09 NOTE — Progress Notes (Addendum)
Established Patient Office Visit  SUBJECTIVE:  Subjective  Patient ID: Cheryl Carpenter, female    DOB: Mar 17, 1990  Age: 29 y.o. MRN: [id number]  CC:  Chief Complaint  Patient presents with  . Annual Exam    HPI BHUMI Carpenter is a 29 y.o. female presenting today for an annual exam.  She states that her glucose is not being regulated; this morning her blood sugar was 256. She is taking Januvia and 34 units of [place]. She is not taking anything for cholesterol or hypertension at this time.   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    Past Medical History:  Diagnosis Date  . Diabetes mellitus without complication (HCC)   . Obesity affecting pregnancy, antepartum 05/30/2017    Past Surgical History:  Procedure Laterality Date  . CESAREAN SECTION  2010   failed VAVD  . CESAREAN SECTION N/A 06/28/2017   Procedure: CESAREAN SECTION, BTL;  Surgeon: Vena Austria, MD;  Location: [facility] ORS;  Service: Obstetrics;  Laterality: N/A;  . FOOT SURGERY    . nexplanon removal  07/2016  . TUBAL LIGATION      Family History  Problem Relation Age of Onset  . Diabetes Father   . Heart failure Other     Social History   Socioeconomic History  . Marital status: Married    Spouse name: Not on file  . Number of children: Not on file  . Years of education: Not on file  . Highest education level: Not on file  Occupational History  . Not on file  Tobacco Use  . Smoking status: Never Smoker  . Smokeless tobacco: Never Used  Vaping Use  . Vaping Use: Never used  Substance and Sexual Activity  . Alcohol use: No  . Drug use: No  . Sexual activity: Yes    Birth control/protection: Surgical  Other Topics Concern  . Not on file  Social History Narrative  . Not on file   Social Determinants of Health   Financial Resource Strain:   . Difficulty of Paying Living Expenses: Not on file  Food Insecurity:   . Worried About Programme researcher, broadcasting/film/video in the Last  Year: Not on file  . Ran Out of Food in the Last Year: Not on file  Transportation Needs:   . Lack of Transportation (Medical): Not on file  . Lack of Transportation (Non-Medical): Not on file  Physical Activity:   . Days of Exercise per Week: Not on file  . Minutes of Exercise per Session: Not on file  Stress:   . Feeling of Stress : Not on file  Social Connections:   . Frequency of Communication with Friends and Family: Not on file  . Frequency of Social Gatherings with Friends and Family: Not on file  . Attends Religious Services: Not on file  . Active Member of Clubs or Organizations: Not on file  . Attends Banker Meetings: Not on file  . Marital Status: Not on file  Intimate Partner Violence:   . Fear of Current or Ex-Partner: Not on file  . Emotionally Abused: Not on file  . Physically Abused: Not on file  . Sexually Abused: Not on file     Current Outpatient Medications:  .  JANUVIA 100 MG tablet, Take 1 tablet (100 mg total) by mouth daily., Disp: 90 tablet, Rfl: 3 .  clindamycin (CLEOCIN) 300 MG capsule, Take 1 capsule (300 mg total) by  mouth 3 (three) times daily., Disp: 21 capsule, Rfl: 0 .  Continuous Blood Gluc Sensor (FREESTYLE LIBRE 2 SENSOR) MISC, 1 each by Does not apply route every 14 (fourteen) days., Disp: 1 each, Rfl: 6 .  ibuprofen (ADVIL,MOTRIN) 600 MG tablet, Take 1 tablet (600 mg total) by mouth every 6 (six) hours as needed for mild pain or cramping., Disp: 30 tablet, Rfl: 1 .  Insulin Pen Needle (PEN NEEDLES) 33G X 4 MM MISC, 1 each by Does not apply route daily before breakfast., Disp: 100 each, Rfl: 6 .  ketoconazole (NIZORAL) 2 % cream, Apply 1 application topically daily., Disp: 30 g, Rfl: 0 .  lisinopril (ZESTRIL) 10 MG tablet, Take 1 tablet (10 mg total) by mouth daily., Disp: 90 tablet, Rfl: 3 .  magic mouthwash SOLN, Take 5 mLs by mouth 3 (three) times daily., Disp: 100 mL, Rfl: 0 .  metroNIDAZOLE (FLAGYL) 500 MG tablet, Take 1 tablet  (500 mg total) by mouth 2 (two) times daily. (Patient not taking: Reported on 12/27/2018), Disp: 14 tablet, Rfl: 0 .  rosuvastatin (CRESTOR) 10 MG tablet, Take 1 tablet (10 mg total) by mouth daily., Disp: 90 tablet, Rfl: 3  Current Facility-Administered Medications:  .  insulin degludec (TRESIBA) 100 UNIT/ML FlexTouch Pen 50 Units, 50 Units, Subcutaneous, Q2200, Corky Downs, MD   Allergies  Allergen Reactions  . Fish Allergy Anaphylaxis  . Peanut-Containing Drug Products Anaphylaxis  . Shellfish Allergy Anaphylaxis  . Sulfa Antibiotics Nausea And Vomiting    ROS Review of Systems  Constitutional: Negative.   HENT: Negative.   Eyes: Negative.   Respiratory: Negative.  Negative for shortness of breath.   Cardiovascular: Negative.  Negative for chest pain.  Gastrointestinal: Negative.   Endocrine: Negative.   Genitourinary: Negative.   Musculoskeletal: Negative.   Skin: Negative.   Allergic/Immunologic: Negative.   Neurological: Negative.   Hematological: Negative.   Psychiatric/Behavioral: Negative.   All other systems reviewed and are negative.    OBJECTIVE:    Physical Exam Vitals reviewed.  Constitutional:      Appearance: Normal appearance.  HENT:     Mouth/Throat:     Mouth: Mucous membranes are moist.  Eyes:     Pupils: Pupils are equal, round, and reactive to light.  Neck:     Vascular: No carotid bruit.  Cardiovascular:     Rate and Rhythm: Normal rate and regular rhythm.     Pulses: Normal pulses.     Heart sounds: Normal heart sounds.  Pulmonary:     Effort: Pulmonary effort is normal.     Breath sounds: Normal breath sounds.  Abdominal:     General: Bowel sounds are normal.     Palpations: Abdomen is soft. There is no hepatomegaly, splenomegaly or mass.     Tenderness: There is no abdominal tenderness.     Hernia: No hernia is present.  Musculoskeletal:        General: No tenderness.     Cervical back: Neck supple.     Right lower leg: No edema.      Left lower leg: No edema.  Skin:    Findings: No rash.  Neurological:     Mental Status: She is alert and oriented to person, place, and time.     Motor: No weakness.  Psychiatric:        Mood and Affect: Mood and affect normal.        Behavior: Behavior normal.     BP 124/90  Pulse 93   Ht 5\' 5"  (1.651 m)   Wt 222 lb 4.8 oz (100.8 kg)   BMI 36.99 kg/m  Wt Readings from Last 3 Encounters:  10/09/19 222 lb 4.8 oz (100.8 kg)  08/06/19 200 lb (90.7 kg)  02/16/19 212 lb (96.2 kg)    Health Maintenance Due  Topic Date Due  . Hepatitis C Screening  Never done  . PNEUMOCOCCAL POLYSACCHARIDE VACCINE AGE 73-64 HIGH RISK  Never done  . FOOT EXAM  Never done  . OPHTHALMOLOGY EXAM  Never done  . COVID-19 Vaccine (1) Never done  . HEMOGLOBIN A1C  11/30/2017  . INFLUENZA VACCINE  08/26/2019    There are no preventive care reminders to display for this patient.  CBC Latest Ref Rng & Units 08/07/2019 12/27/2018 10/12/2018  WBC 4.0 - 10.5 K/uL 11.2(H) 11.9(H) 13.9(H)  Hemoglobin 12.0 - 15.0 g/dL 10/14/2018 12.8 78.6  Hematocrit 36 - 46 % 39.5 44.3 39.4  Platelets 150 - 400 K/uL 244 244 235   CMP Latest Ref Rng & Units 08/07/2019 12/27/2018 10/12/2018  Glucose 70 - 99 mg/dL 10/14/2018) 209(O) 709(GG)  BUN 6 - 20 mg/dL 12 6 14   Creatinine 0.44 - 1.00 mg/dL 836(O 2.94  Sodium 135 - 145 mmol/L 139 131(L) 139  Potassium 3.5 - 5.1 mmol/L 3.9 3.7 3.7  Chloride 98 - 111 mmol/L 105 97(L) 106  CO2 22 - 32 mmol/L 27 25 26   Calcium 8.9 - 10.3 mg/dL 9.2 9.2 9.3  Total Protein 6.5 - 8.1 g/dL 8.3(H) - 8.2(H)  Total Bilirubin 0.3 - 1.2 mg/dL 0.7 - 0.8  Alkaline Phos 38 - 126 U/L 44 - 60  AST 15 - 41 U/L 16 - 20  ALT 0 - 44 U/L 21 - 20    No results found for: TSH Lab Results  Component Value Date   ALBUMIN 4.1 08/07/2019   ANIONGAP 7 08/07/2019   No results found for: CHOL, HDL, LDLCALC, CHOLHDL No results found for: TRIG Lab Results  Component Value Date   HGBA1C 5.9 (H) 05/30/2017    HGBA1C 6.0 (H) 12/13/2016      ASSESSMENT & PLAN:   Problem List Items Addressed This Visit      Endocrine   Diabetes mellitus, type 2 (HCC) - Primary    - The patient's blood sugar is not under control on 08/09/2019. - The patient will change the current treatment regimen to increase the Tresiba to 50 units subcutaneously daily.  - I encouraged the patient to regularly check blood sugar.  - I encouraged the patient to monitor diet. I encouraged the patient to eat low-carb and low-sugar to help prevent blood sugar spikes.  - I encouraged the patient to continue following their prescribed treatment plan for diabetes - I informed the patient to get help if blood sugar drops below 54mg /dL, or if suddenly have trouble thinking clearly or breathing.       Relevant Medications   lisinopril (ZESTRIL) 10 MG tablet   rosuvastatin (CRESTOR) 10 MG tablet   insulin degludec (TRESIBA) 100 UNIT/ML FlexTouch Pen 50 Units   Other Relevant Orders   POCT glucose (manual entry) (Completed)   Microalbumin, urine (Completed)     Other   Annual physical exam    Pt's main problem at the present time is obesity and uncontrolled diabetes. She was advised to make every effort to lose weight. We'll increase her Tresiba to 50 units subcutaneously daily. I also started her on Statin  and Ace Inhibitor. We will check her for albumin in the urine.       Obesity (BMI 30-39.9)    - I encouraged the patient to lose weight.  - I educated them on making healthy dietary choices including eating more fruits and vegetables and less fried foods. - I encouraged the patient to exercise more, and educated on the benefits of exercise including weight loss, diabetes prevention, and hypertension management.        Relevant Medications   insulin degludec (TRESIBA) 100 UNIT/ML FlexTouch Pen 50 Units      Meds ordered this encounter  Medications  . Continuous Blood Gluc Sensor (FREESTYLE LIBRE 2 SENSOR) MISC     Sig: 1 each by Does not apply route every 14 (fourteen) days.    Dispense:  1 each    Refill:  6  . Insulin Pen Needle (PEN NEEDLES) 33G X 4 MM MISC    Sig: 1 each by Does not apply route daily before breakfast.    Dispense:  100 each    Refill:  6  . lisinopril (ZESTRIL) 10 MG tablet    Sig: Take 1 tablet (10 mg total) by mouth daily.    Dispense:  90 tablet    Refill:  3  . rosuvastatin (CRESTOR) 10 MG tablet    Sig: Take 1 tablet (10 mg total) by mouth daily.    Dispense:  90 tablet    Refill:  3  . insulin degludec (TRESIBA) 100 UNIT/ML FlexTouch Pen 50 Units    Follow-up: No follow-ups on file.    Corky DownsJaved Candies Palm, MD [facility] [address], [place], [place] [id number]   By signing my name below, I, [employer], attest that this documentation has been prepared under the direction and in the presence of Dr. Corky DownsJaved Kyrese Gartman Electronically Signed: Corky DownsJaved Tanush Drees, MD 10/23/19, 11:24 AM  I personally performed the services described in this documentation, which was SCRIBED in my presence. The recorded information has been reviewed and considered accurate. It has been edited as necessary during review. Corky DownsJaved Fahim Kats, MD

## 2019-10-10 LAB — MICROALBUMIN, URINE: Microalb, Ur: 0.3 mg/dL

## 2019-10-23 ENCOUNTER — Encounter: Payer: Self-pay | Admitting: Internal Medicine

## 2019-10-23 DIAGNOSIS — E669 Obesity, unspecified: Secondary | ICD-10-CM | POA: Insufficient documentation

## 2019-10-23 NOTE — Assessment & Plan Note (Signed)
-   I encouraged the patient to lose weight.  - I educated them on making healthy dietary choices including eating more fruits and vegetables and less fried foods. - I encouraged the patient to exercise more, and educated on the benefits of exercise including weight loss, diabetes prevention, and hypertension management.   

## 2019-12-10 ENCOUNTER — Other Ambulatory Visit: Payer: Self-pay

## 2019-12-10 ENCOUNTER — Encounter: Payer: Self-pay | Admitting: Obstetrics & Gynecology

## 2019-12-10 ENCOUNTER — Ambulatory Visit (INDEPENDENT_AMBULATORY_CARE_PROVIDER_SITE_OTHER): Payer: PRIVATE HEALTH INSURANCE | Admitting: Obstetrics & Gynecology

## 2019-12-10 VITALS — BP 110/60 | Ht 65.0 in | Wt 215.0 lb

## 2019-12-10 DIAGNOSIS — N920 Excessive and frequent menstruation with regular cycle: Secondary | ICD-10-CM

## 2019-12-10 DIAGNOSIS — Z01419 Encounter for gynecological examination (general) (routine) without abnormal findings: Secondary | ICD-10-CM | POA: Diagnosis not present

## 2019-12-10 NOTE — Progress Notes (Signed)
HPI:      [name] is a 29 y.o. [id number] who LMP was Patient's last menstrual period was 11/26/2019., she presents today for her annual examination. The patient has no complaints today. The patient is sexually active. Her last pap: approximate date 2020 and was normal. The patient does perform self breast exams.  There is no notable family history of breast or ovarian cancer in her family.  The patient has regular exercise: yes.  The patient denies current symptoms of depression.    GYN History: Contraception: tubal ligation  PMHx: Past Medical History:  Diagnosis Date  . Diabetes mellitus without complication (HCC)   . Obesity affecting pregnancy, antepartum 05/30/2017   Past Surgical History:  Procedure Laterality Date  . CESAREAN SECTION  2010   failed VAVD  . CESAREAN SECTION N/A 06/28/2017   Procedure: CESAREAN SECTION, BTL;  Surgeon: Vena Austria, MD;  Location: [facility] ORS;  Service: Obstetrics;  Laterality: N/A;  . FOOT SURGERY    . nexplanon removal  07/2016  . TUBAL LIGATION     Family History  Problem Relation Age of Onset  . Diabetes Father   . Heart failure Other    Social History   Tobacco Use  . Smoking status: Never Smoker  . Smokeless tobacco: Never Used  Vaping Use  . Vaping Use: Never used  Substance Use Topics  . Alcohol use: No  . Drug use: No    Current Outpatient Medications:  .  Continuous Blood Gluc Sensor (FREESTYLE LIBRE 2 SENSOR) MISC, 1 each by Does not apply route every 14 (fourteen) days., Disp: 1 each, Rfl: 6 .  Insulin Pen Needle (PEN NEEDLES) 33G X 4 MM MISC, 1 each by Does not apply route daily before breakfast., Disp: 100 each, Rfl: 6 .  JANUVIA 100 MG tablet, Take 1 tablet (100 mg total) by mouth daily., Disp: 90 tablet, Rfl: 3 .  lisinopril (ZESTRIL) 10 MG tablet, Take 1 tablet (10 mg total) by mouth daily., Disp: 90 tablet, Rfl: 3 .  rosuvastatin (CRESTOR) 10 MG tablet, Take 1 tablet (10 mg total) by mouth daily., Disp: 90  tablet, Rfl: 3 .  clindamycin (CLEOCIN) 300 MG capsule, Take 1 capsule (300 mg total) by mouth 3 (three) times daily., Disp: 21 capsule, Rfl: 0 .  ibuprofen (ADVIL,MOTRIN) 600 MG tablet, Take 1 tablet (600 mg total) by mouth every 6 (six) hours as needed for mild pain or cramping., Disp: 30 tablet, Rfl: 1 .  ketoconazole (NIZORAL) 2 % cream, Apply 1 application topically daily., Disp: 30 g, Rfl: 0 .  magic mouthwash SOLN, Take 5 mLs by mouth 3 (three) times daily., Disp: 100 mL, Rfl: 0 .  metroNIDAZOLE (FLAGYL) 500 MG tablet, Take 1 tablet (500 mg total) by mouth 2 (two) times daily. (Patient not taking: Reported on 12/27/2018), Disp: 14 tablet, Rfl: 0  Current Facility-Administered Medications:  .  insulin degludec (TRESIBA) 100 UNIT/ML FlexTouch Pen 50 Units, 50 Units, Subcutaneous, Q2200, Masoud, Javed, MD Allergies: Fish allergy, Peanut-containing drug products, Shellfish allergy, and Sulfa antibiotics  Review of Systems  Constitutional: Negative for chills, fever and malaise/fatigue.  HENT: Negative for congestion, sinus pain and sore throat.   Eyes: Negative for blurred vision and pain.  Respiratory: Negative for cough and wheezing.   Cardiovascular: Negative for chest pain and leg swelling.  Gastrointestinal: Negative for abdominal pain, constipation, diarrhea, heartburn, nausea and vomiting.  Genitourinary: Negative for dysuria, frequency, hematuria and urgency.  Musculoskeletal: Negative for  back pain, joint pain, myalgias and neck pain.  Skin: Negative for itching and rash.  Neurological: Negative for dizziness, tremors and weakness.  Endo/Heme/Allergies: Does not bruise/bleed easily.  Psychiatric/Behavioral: Negative for depression. The patient is not nervous/anxious and does not have insomnia.     Objective: BP 110/60   Ht 5\' 5"  (1.651 m)   Wt 215 lb (97.5 kg)   LMP 11/26/2019   BMI 35.78 kg/m   Filed Weights   12/10/19 1032  Weight: 215 lb (97.5 kg)   Body mass index  is 35.78 kg/m. Physical Exam Constitutional:      General: She is not in acute distress.    Appearance: She is well-developed. She is obese.  Genitourinary:     Pelvic exam was performed with patient supine.     Vagina, uterus and rectum normal.     No lesions in the vagina.     No vaginal bleeding.     No cervical motion tenderness, friability, lesion or polyp.     Uterus is mobile.     Uterus is not enlarged.     No uterine mass detected.    Uterus is midaxial.     No right or left adnexal mass present.     Right adnexa not tender.     Left adnexa not tender.  HENT:     Head: Normocephalic and atraumatic. No laceration.     Right Ear: Hearing normal.     Left Ear: Hearing normal.     Mouth/Throat:     Pharynx: Uvula midline.  Eyes:     Pupils: Pupils are equal, round, and reactive to light.  Neck:     Thyroid: No thyromegaly.  Cardiovascular:     Rate and Rhythm: Normal rate and regular rhythm.     Heart sounds: No murmur heard.  No friction rub. No gallop.   Pulmonary:     Effort: Pulmonary effort is normal. No respiratory distress.     Breath sounds: Normal breath sounds. No wheezing.  Chest:     Breasts:        Right: No mass, skin change or tenderness.        Left: No mass, skin change or tenderness.  Abdominal:     General: Bowel sounds are normal. There is no distension.     Palpations: Abdomen is soft.     Tenderness: There is no abdominal tenderness. There is no rebound.  Musculoskeletal:        General: Normal range of motion.     Cervical back: Normal range of motion and neck supple.  Neurological:     Mental Status: She is alert and oriented to person, place, and time.     Cranial Nerves: No cranial nerve deficit.  Skin:    General: Skin is warm and dry.  Psychiatric:        Judgment: Judgment normal.  Vitals reviewed.     Assessment:  ANNUAL EXAM 1. Women's annual routine gynecological examination   2. Menorrhagia with regular cycle       Screening Plan:            1.  Cervical Screening-  Pap smear schedule reviewed with patient  2. Breast screening- Exam annually and mammogram>40 planned   3. Colonoscopy every 10 years, Hemoccult testing - after age 32  4. Labs managed by PCP  5. Counseling for contraception: bilateral tubal ligation    6. Menorrhagia with regular cycle Patient has abnormal uterine bleeding .  She has a normal exam today, with no evidence of lesions.  Evaluation includes the following: exam, labs such as hormonal testing, and pelvic ultrasound to evaluate for any structural gynecologic abnormalities.  Patient to follow up after testing.  Treatment option for menorrhagia or menometrorrhagia discussed in great detail with the patient.  Options include hormonal therapy, IUD therapy such as Mirena, D&C, Ablation, and Hysterectomy.  The pros and cons of each option discussed with patient.     F/U  Return in about 2 weeks (around 12/24/2019) for Follow up w GYN [place].  Annamarie Major, MD, [facility], [facility] 12/10/2019  11:30 AM

## 2019-12-10 NOTE — Patient Instructions (Signed)
PAP every three years Labs yearly (with PCP)  Thank you for choosing [facility]. As part of our ongoing efforts to improve patient experience, we would appreciate your feedback. Please fill out the short survey that you will receive by mail or MyChart. Your opinion is important to [place]! - Dr. Tiburcio Pea  Endometrial Ablation Endometrial ablation is a procedure that destroys the thin inner layer of the lining of the uterus (endometrium). This procedure may be done:  To stop heavy periods.  To stop bleeding that is causing anemia.  To control irregular bleeding.  To treat bleeding caused by small tumors (fibroids) in the endometrium. This procedure is often an alternative to major surgery, such as removal of the uterus and cervix (hysterectomy). As a result of this procedure:  You may not be able to have children. However, if you are premenopausal (you have not gone through menopause): ? You may still have a small chance of getting pregnant. ? You will need to use a reliable method of birth control after the procedure to prevent pregnancy.  You may stop having a menstrual period, or you may have only a small amount of bleeding during your period. Menstruation may return several years after the procedure. Tell a health care provider about:  Any allergies you have.  All medicines you are taking, including vitamins, herbs, eye drops, creams, and over-the-counter medicines.  Any problems you or family members have had with the use of anesthetic medicines.  Any blood disorders you have.  Any surgeries you have had.  Any medical conditions you have. What are the risks? Generally, this is a safe procedure. However, problems may occur, including:  A hole (perforation) in the uterus or bowel.  Infection of the uterus, bladder, or vagina.  Bleeding.  Damage to other structures or organs.  An air bubble in the lung (air embolus).  Problems with pregnancy after the  procedure.  Failure of the procedure.  Decreased ability to diagnose cancer in the endometrium. What happens before the procedure?  You will have tests of your endometrium to make sure there are no pre-cancerous cells or cancer cells present.  You may have an ultrasound of the uterus.  You may be given medicines to thin the endometrium.  Ask your health care provider about: ? Changing or stopping your regular medicines. This is especially important if you take diabetes medicines or blood thinners. ? Taking medicines such as aspirin and ibuprofen. These medicines can thin your blood. Do not take these medicines before your procedure if your doctor tells you not to.  Plan to have someone take you home from the hospital or clinic. What happens during the procedure?   You will lie on an exam table with your feet and legs supported as in a pelvic exam.  To lower your risk of infection: ? Your health care team will wash or sanitize their hands and put on germ-free (sterile) gloves. ? Your genital area will be washed with soap.  An IV tube will be inserted into one of your veins.  You will be given a medicine to help you relax (sedative).  A surgical instrument with a light and camera (resectoscope) will be inserted into your vagina and moved into your uterus. This allows your surgeon to see inside your uterus.  Endometrial tissue will be removed using one of the following methods: ? Radiofrequency. This method uses a radiofrequency-alternating electric current to remove the endometrium. ? Cryotherapy. This method uses extreme cold to freeze  the endometrium. ? Heated-free liquid. This method uses a heated saltwater (saline) solution to remove the endometrium. ? Microwave. This method uses high-energy microwaves to heat up the endometrium and remove it. ? Thermal balloon. This method involves inserting a catheter with a balloon tip into the uterus. The balloon tip is filled with heated  fluid to remove the endometrium. The procedure may vary among health care providers and hospitals. What happens after the procedure?  Your blood pressure, heart rate, breathing rate, and blood oxygen level will be monitored until the medicines you were given have worn off.  As tissue healing occurs, you may notice vaginal bleeding for 4-6 weeks after the procedure. You may also experience: ? Cramps. ? Thin, watery vaginal discharge that is light pink or brown in color. ? A need to urinate more frequently than usual. ? Nausea.  Do not drive for 24 hours if you were given a sedative.  Do not have sex or insert anything into your vagina until your health care provider approves. Summary  Endometrial ablation is done to treat the many causes of heavy menstrual bleeding.  The procedure may be done only after medications have been tried to control the bleeding.  Plan to have someone take you home from the hospital or clinic. This information is not intended to replace advice given to you by your health care provider. Make sure you discuss any questions you have with your health care provider. Document Revised: 06/28/2017 Document Reviewed: 01/29/2016 Elsevier Patient Education  2020 [employer].

## 2020-01-08 ENCOUNTER — Ambulatory Visit (INDEPENDENT_AMBULATORY_CARE_PROVIDER_SITE_OTHER): Payer: PRIVATE HEALTH INSURANCE

## 2020-01-08 ENCOUNTER — Other Ambulatory Visit: Payer: Self-pay

## 2020-01-08 ENCOUNTER — Ambulatory Visit (INDEPENDENT_AMBULATORY_CARE_PROVIDER_SITE_OTHER): Payer: PRIVATE HEALTH INSURANCE | Admitting: Obstetrics & Gynecology

## 2020-01-08 ENCOUNTER — Encounter: Payer: Self-pay | Admitting: Obstetrics & Gynecology

## 2020-01-08 ENCOUNTER — Other Ambulatory Visit: Payer: Self-pay | Admitting: Obstetrics & Gynecology

## 2020-01-08 VITALS — BP 100/70 | Ht 65.0 in | Wt 218.0 lb

## 2020-01-08 DIAGNOSIS — N920 Excessive and frequent menstruation with regular cycle: Secondary | ICD-10-CM

## 2020-01-08 NOTE — Progress Notes (Signed)
  HPI: Pt has resumed periods since removal of Nexplanon.  For the last 4-5 mos her periods have been prolonged for 9-10 days and heavy for most of those days.  Associated w some cramping.  Prior BTL, no fertility desired.  Has been counseled on options, and desires ablation.  [place] today to assess uterus.  Ultrasound demonstrates no masses seen These findings are Pelvis normal  Last PAP 11/2018 nml  PMHx: She  has a past medical history of Diabetes mellitus without complication (HCC) and Obesity affecting pregnancy, antepartum (05/30/2017). Also,  has a past surgical history that includes Foot surgery; nexplanon removal (07/2016); Tubal ligation; Cesarean section (2010); and Cesarean section (N/A, 06/28/2017)., family history includes Diabetes in her father; Heart failure in an other family member.,  reports that she has never smoked. She has never used smokeless tobacco. She reports that she does not drink alcohol and does not use drugs.  She has a current medication list which includes the following prescription(s): pen needles, januvia, lisinopril, rosuvastatin, and freestyle libre 2 sensor. Also, is allergic to fish allergy, peanut-containing drug products, shellfish allergy, and sulfa antibiotics.  Review of Systems  All other systems reviewed and are negative.   Objective: BP 100/70   Ht 5\' 5"  (1.651 m)   Wt 218 lb (98.9 kg)   LMP 01/06/2020   BMI 36.28 kg/m   Physical examination Constitutional NAD, Conversant  Skin No rashes, lesions or ulceration.   Extremities: Moves all appropriately.  Normal ROM for age. No lymphadenopathy.  Neuro: Grossly intact  Psych: Oriented to PPT.  Normal mood. Normal affect.   14/12/2019 PELVIS TRANSVAGINAL NON-OB (TV ONLY)  Result Date: 01/08/2020 Patient Name: Cheryl Carpenter DOB: 04/15/1990 MRN: 1990 ULTRASOUND REPORT Location: [facility] Date of Service: 01/08/2020 Indications:AUB and Menorrhagia Findings: The uterus is retroverted and measures  7.0 x 6.0 x 4.5 cm. Echo texture is homogenous without evidence of focal masses. The Endometrium measures 4.3 mm. Right Ovary measures 3.6 x 3.2 x 1.8 cm. It is normal in appearance. Left Ovary measures 4.3 x 1.7 x 1.6 cm. It is normal in appearance. Survey of the adnexa demonstrates no adnexal masses. There is no free fluid in the cul de sac. Impression: 1. Normal pelvic ultrasound. Recommendations: 1.Clinical correlation with the patient's History and Physical Exam. 01/10/2020, RT Review of ULTRASOUND.    I have personally reviewed images and report of recent ultrasound done at [facility].    Plan of management to be discussed with patient. [facility], MD, FACOG [facility], [facility] 01/08/2020  11:15 AM    Assessment:  Menorrhagia with regular cycle Options discussed; hormones vs ablation vs hysterectomy.  Desires ablation.  Pros and cons and recovery discussed. Plan after next cycle in Jan.  A total of 20 minutes were spent face-to-face with the patient as well as preparation, review, communication, and documentation during this encounter.   Feb, MD, [facility], [facility] 01/08/2020  11:23 AM

## 2020-01-21 ENCOUNTER — Telehealth: Payer: Self-pay | Admitting: Obstetrics & Gynecology

## 2020-01-21 NOTE — Telephone Encounter (Signed)
-----   Message from Nadara Mustard, MD sent at 01/08/2020 11:22 AM EST ----- Regarding: Surgery Surgery Booking Request Patient Full Name:  Cheryl Carpenter  MRN: [id number]  DOB: 1990/09/14  Surgeon: Letitia Libra, MD  Requested Surgery Date and Time: Week of Jan 10 Primary Diagnosis AND Code:   1. Menorrhagia with regular cycle  N92.0  Secondary Diagnosis and Code:  Surgical Procedure: Hysteroscopy D&C with Ablation RNFA Requested?: No L&D Notification: No Admission Status: same day surgery Length of Surgery: 25 min Special Case Needs: Yes - MINERVA H&P: Yes Phone Interview???:  Yes Interpreter: No Medical Clearance:  No Special Scheduling Instructions: No Any known health/anesthesia issues, diabetes, sleep apnea, latex allergy, defibrillator/pacemaker?: No Acuity: P3   (P1 highest, P2 delay may cause harm, P3 low, elective gyn, P4 lowest)

## 2020-01-21 NOTE — Telephone Encounter (Signed)
Called patient to schedule Hysteroscopy D&C w Minerva w Tiburcio Pea  DOS - in office 02/05/2020 @ 1:30  H&P 01/28/20 @  11:00  Vic Ripper is available

## 2020-01-28 ENCOUNTER — Encounter: Payer: PRIVATE HEALTH INSURANCE | Admitting: Obstetrics & Gynecology

## 2020-01-31 ENCOUNTER — Encounter: Payer: PRIVATE HEALTH INSURANCE | Admitting: Obstetrics & Gynecology

## 2020-02-05 ENCOUNTER — Ambulatory Visit: Payer: PRIVATE HEALTH INSURANCE | Admitting: Obstetrics & Gynecology

## 2020-02-29 ENCOUNTER — Other Ambulatory Visit: Payer: Self-pay | Admitting: Internal Medicine

## 2020-02-29 ENCOUNTER — Encounter: Payer: Self-pay | Admitting: Obstetrics & Gynecology

## 2020-02-29 ENCOUNTER — Ambulatory Visit (INDEPENDENT_AMBULATORY_CARE_PROVIDER_SITE_OTHER): Payer: 59 | Admitting: Obstetrics & Gynecology

## 2020-02-29 ENCOUNTER — Other Ambulatory Visit: Payer: Self-pay

## 2020-02-29 VITALS — BP 120/80 | Ht 65.0 in | Wt 210.0 lb

## 2020-02-29 DIAGNOSIS — N920 Excessive and frequent menstruation with regular cycle: Secondary | ICD-10-CM | POA: Diagnosis not present

## 2020-02-29 NOTE — Progress Notes (Signed)
Hysteroscopy with Minerva Endometrial Ablation  Operative Report   Place of Surgery: [facility] Pre-Operative Diagnosis: Menorrhagia   Procedure: Hysteroscopy with Minerva Endometrial Ablation  Surgeon: Annamarie Major, MD  Anesthesia: Paracervical Block   Operative Technique:  The patient was prepped and draped in the usual manner for Hysteroscopy with Minerva. Patient had voided prior to coming to the procedure room. A short weighted speculum was placed into the vagina and the anterior lip of the cervix was grasped with a single tooth tenaculum. Deep intra-cervical block was placed with a mixture of Marcaine 0.5% (6 cc's) and Lidocaine 1% (6 cc's) by placing 5 cc's into each quadrant with a spinal needle. The endocervical canal was measured to 3 centimeters. The uterus was then sounded to 7 centimeters. The cervix was dilated to 8 mm with Hegar dilators. Hysteroscopy was then carried out using saline as the distention media.  Hysteroscopy revealed:   A normal endometrial cavity without perforation or myometrial damage. Both tubal ostia were visualized.   It was decided to proceed with the Minerva ablation. The device was set for the appropriate cavity length and was placed into the uterus and deployed. The width was noted on the device to be in the approved Green Zone. The cervical balloon was inflated and the cervical seal was confirmed by the Minerva Multiplex Controller. The foot pedal was then pressed and the 2-part Uterine Integrity Test (UIT) confirmed the integrity of the cavity. Upon completion of the UIT, the treatment cycle lasted 120 seconds. The device was unlocked and removed.   Repeat hysteroscopy revealed that all visual areas of active endometrium had been adequately treated. At this point the procedure is complete and the patient was in stable condition.   She will be allowed to return to her home, provided that she is doing well. She will be instructed to call to report any increased  bleeding, uncontrolled pain, fever, SOB, or any other significant change in her condition. She is given a postoperative instruction sheet and a follow up appointment for 2 weeks.   Annamarie Major, MD, [facility], [facility] 02/29/2020  9:11 AM

## 2020-02-29 NOTE — Patient Instructions (Signed)
Endometrial Ablation, Care After The following information offers guidance on how to care for yourself after your procedure. Your health care provider may also give you more specific instructions. If you have problems or questions, contact your health care provider. What can I expect after the procedure? After the procedure, it is common to have:  A need to urinate more often than usual for the first 24 hours.  Cramps that feel like menstrual cramps. These may last for 1-2 days.  A thin, watery vaginal discharge that is light pink or brown. This may last for a few weeks. Discharge will be heavy for the first few days after your procedure. You may need to wear a sanitary pad.  Nausea.  Vaginal bleeding for 4-6 weeks after the procedure, as tissue healing occurs. Follow these instructions at home: Medicines  Take over-the-counter and prescription medicines only as told by your health care provider.  If you were prescribed an antibiotic medicine, take it as told by your health care provider. Do not stop taking the antibiotic even if you start to feel better.  Ask your health care provider if the medicine prescribed to you: ? Requires you to avoid driving or using machinery. ? Can cause constipation. You may need to take these actions to prevent or treat constipation:  Drink enough fluid to keep your urine pale yellow.  Take over-the-counter or prescription medicines.  Eat foods that are high in fiber, such as beans, whole grains, and fresh fruits and vegetables.  Limit foods that are high in fat and processed sugars, such as fried or sweet foods.   Activity  If you were given a sedative during the procedure, it can affect you for several hours. Do not drive or operate machinery until your health care provider says that it is safe.  Do not have sex or put anything into your vagina until your health care provider says that it is safe.  Do not lift anything that is heavier than 5 lb  (2.3 kg), or the limit that you are told, until your health care provider says that it is safe.  Return to your normal activities as told by your health care provider. Ask your health care provider what activities are safe for you. General instructions  Do not take baths, swim, or use a hot tub until your health care provider says that it is safe. You will be able to take showers.  Check your vaginal area every day for signs of infection. Check for: ? Redness, swelling, or more pain. ? More blood coming from your vagina. ? A bad-smelling discharge.  Keep all follow-up visits. This is important. Contact a health care provider if:  You have vaginal redness, swelling, or more pain.  You have discharge or bleeding from your vagina that is getting worse.  You have a bad-smelling vaginal discharge.  You have a fever or chills.  You have trouble urinating. Get help right away if:  You have heavy, bright red vaginal bleeding that may include blood clots.  You have severe cramps that do not get better with medicine. Summary  After endometrial ablation, it is normal to have a thin, watery vaginal discharge that is light pink or brown. This may last a few weeks and may be heavier right after the procedure.  Vaginal bleeding is common after the procedure and should get better with time.  Check your vaginal area every day for signs of infection, such as a bad-smelling discharge.  Keep all follow-up   visits. This is important. This information is not intended to replace advice given to you by your health care provider. Make sure you discuss any questions you have with your health care provider. Document Revised: 08/02/2019 Document Reviewed: 08/02/2019 Elsevier Patient Education  2021 Elsevier Inc.  

## 2020-03-03 NOTE — Telephone Encounter (Signed)
Can you send in anything for pain? She had a in office ablation last week with RPH.

## 2020-03-04 ENCOUNTER — Other Ambulatory Visit: Payer: Self-pay | Admitting: Obstetrics & Gynecology

## 2020-03-04 MED ORDER — MELOXICAM 15 MG PO TABS: 15.0000 mg | ORAL_TABLET | Freq: Two times a day (BID) | ORAL | 3 refills | Status: DC | PRN

## 2020-03-06 ENCOUNTER — Ambulatory Visit: Payer: 59 | Admitting: Obstetrics & Gynecology

## 2020-03-06 NOTE — Telephone Encounter (Signed)
Is the odor normal?

## 2020-03-14 ENCOUNTER — Ambulatory Visit: Payer: 59 | Admitting: Obstetrics & Gynecology

## 2020-05-05 ENCOUNTER — Encounter: Payer: Self-pay | Admitting: Radiology

## 2020-05-05 ENCOUNTER — Emergency Department
Admission: EM | Admit: 2020-05-05 | Discharge: 2020-05-05 | Disposition: A | Discharge status: Home / Self Care | Payer: 59 | Attending: Emergency Medicine | Admitting: Emergency Medicine

## 2020-05-05 ENCOUNTER — Emergency Department: Payer: 59

## 2020-05-05 DIAGNOSIS — W2210XA Striking against or struck by unspecified automobile airbag, initial encounter: Secondary | ICD-10-CM

## 2020-05-05 DIAGNOSIS — Y92410 Unspecified street and highway as the place of occurrence of the external cause: Secondary | ICD-10-CM | POA: Insufficient documentation

## 2020-05-05 DIAGNOSIS — Z91010 Allergy to peanuts: Secondary | ICD-10-CM | POA: Diagnosis not present

## 2020-05-05 DIAGNOSIS — R079 Chest pain, unspecified: Secondary | ICD-10-CM | POA: Diagnosis not present

## 2020-05-05 DIAGNOSIS — E119 Type 2 diabetes mellitus without complications: Secondary | ICD-10-CM | POA: Diagnosis not present

## 2020-05-05 DIAGNOSIS — Z794 Long term (current) use of insulin: Secondary | ICD-10-CM | POA: Diagnosis not present

## 2020-05-05 DIAGNOSIS — M546 Pain in thoracic spine: Secondary | ICD-10-CM | POA: Diagnosis not present

## 2020-05-05 DIAGNOSIS — R0789 Other chest pain: Secondary | ICD-10-CM | POA: Insufficient documentation

## 2020-05-05 DIAGNOSIS — R5381 Other malaise: Secondary | ICD-10-CM | POA: Diagnosis not present

## 2020-05-05 MED ORDER — OXYCODONE-ACETAMINOPHEN 5-325 MG PO TABS: 1.0000 | ORAL_TABLET | ORAL | 0 refills | Status: DC | PRN

## 2020-05-05 MED ORDER — IBUPROFEN 800 MG PO TABS: 800.0000 mg | ORAL_TABLET | Freq: Three times a day (TID) | ORAL | 0 refills | Status: DC | PRN

## 2020-05-05 MED ORDER — OXYCODONE-ACETAMINOPHEN 5-325 MG PO TABS
1.0000 | ORAL_TABLET | Freq: Once | ORAL | Status: AC
  Administered 2020-05-05: 1 via ORAL
  Filled 2020-05-05: qty 1

## 2020-05-05 NOTE — Discharge Instructions (Signed)
You may take pain medicines as needed (Motrin/Percocet #15).  You may apply ice to affected area several times daily.  Return to the ER for worsening symptoms, persistent vomiting, difficulty breathing or other concerns.
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
## 2020-07-10 ENCOUNTER — Other Ambulatory Visit: Payer: Self-pay

## 2020-07-10 ENCOUNTER — Telehealth: Payer: 59 | Admitting: Physician Assistant

## 2020-07-10 DIAGNOSIS — N898 Other specified noninflammatory disorders of vagina: Secondary | ICD-10-CM

## 2020-07-10 DIAGNOSIS — T3695XA Adverse effect of unspecified systemic antibiotic, initial encounter: Secondary | ICD-10-CM | POA: Diagnosis not present

## 2020-07-10 DIAGNOSIS — B379 Candidiasis, unspecified: Secondary | ICD-10-CM | POA: Diagnosis not present

## 2020-07-10 DIAGNOSIS — R3989 Other symptoms and signs involving the genitourinary system: Secondary | ICD-10-CM | POA: Diagnosis not present

## 2020-07-10 MED ORDER — CEPHALEXIN 500 MG PO CAPS: 500.0000 mg | ORAL_CAPSULE | Freq: Two times a day (BID) | ORAL | 0 refills | Status: DC

## 2020-07-10 MED ORDER — FLUCONAZOLE 150 MG PO TABS: 150.0000 mg | ORAL_TABLET | Freq: Once | ORAL | 0 refills | Status: AC

## 2020-07-10 MED FILL — Sitagliptin Phosphate Tab 100 MG (Base Equiv): ORAL | 90 days supply | Qty: 90 | Fill #0 | Status: CN

## 2020-07-10 MED FILL — Insulin Degludec Soln Pen-Injector 100 Unit/ML: SUBCUTANEOUS | 40 days supply | Qty: 15 | Fill #0 | Status: CN

## 2020-07-10 MED FILL — Meloxicam Tab 15 MG: ORAL | 30 days supply | Qty: 60 | Fill #0 | Status: CN

## 2020-07-10 MED FILL — Sitagliptin Phosphate Tab 100 MG (Base Equiv): ORAL | 30 days supply | Qty: 30 | Fill #0 | Status: CN

## 2020-07-10 NOTE — Progress Notes (Signed)
Patient did previous evisit but did not mention partner with swollen urethra, or her having discolored discharge that has an odor. Patient redirected for in-person evaluation for STD testing.

## 2020-07-10 NOTE — Progress Notes (Signed)
We are sorry that you are not feeling well.  Here is how we plan to help!  Based on what you shared with me it looks like you most likely have a simple urinary tract infection.  A UTI (Urinary Tract Infection) is a bacterial infection of the bladder.  Most cases of urinary tract infections are simple to treat but a key part of your care is to encourage you to drink plenty of fluids and watch your symptoms carefully.  I have prescribed Keflex 500 mg twice a day for 7 days.  Your symptoms should gradually improve. Call us if the burning in your urine worsens, you develop worsening fever, back pain or pelvic pain or if your symptoms do not resolve after completing the antibiotic.  I will also send in Diflucan 150mg  tablet for yeast infection.   Urinary tract infections can be prevented by drinking plenty of water to keep your body hydrated.  Also be sure when you wipe, wipe from front to back and don't hold it in!  If possible, empty your bladder every 4 hours.  Your e-visit answers were reviewed by a board certified advanced clinical practitioner to complete your personal care plan.  Depending on the condition, your plan could have included both over the counter or prescription medications.  If there is a problem please reply  once you have received a response from your provider.  Your safety is important to .  If you have drug allergies check your prescription carefully.    You can use MyChart to ask questions about today's visit, request a non-urgent call back, or ask for a work or school excuse for 24 hours related to this e-Visit. If it has been greater than 24 hours you will need to follow up with your provider, or enter a new e-Visit to address those concerns.   You will get an e-mail in the next two days asking about your experience.  I hope that your e-visit has been valuable and will speed your recovery. Thank you for using e-visits.  I provided 6 minutes of non face-to-face time  during this encounter for chart review and documentation.

## 2020-07-24 ENCOUNTER — Encounter: Payer: 59 | Admitting: Family Medicine

## 2020-07-29 ENCOUNTER — Other Ambulatory Visit: Payer: Self-pay

## 2020-10-21 ENCOUNTER — Other Ambulatory Visit: Payer: Self-pay

## 2020-10-21 MED ORDER — FLUARIX QUADRIVALENT 0.5 ML IM SUSY
PREFILLED_SYRINGE | INTRAMUSCULAR | 0 refills | Status: DC
  Filled 2020-10-21: qty 0.5, 1d supply, fill #0

## 2020-11-07 ENCOUNTER — Other Ambulatory Visit: Payer: Self-pay

## 2020-11-21 ENCOUNTER — Telehealth: Payer: 59 | Admitting: Physician Assistant

## 2020-11-21 DIAGNOSIS — B001 Herpesviral vesicular dermatitis: Secondary | ICD-10-CM

## 2020-11-21 MED ORDER — VALACYCLOVIR HCL 1 G PO TABS: 2000.0000 mg | ORAL_TABLET | Freq: Two times a day (BID) | ORAL | 0 refills | Status: AC

## 2020-11-21 NOTE — Progress Notes (Signed)

## 2020-12-12 ENCOUNTER — Other Ambulatory Visit: Payer: Self-pay

## 2020-12-12 MED FILL — Meloxicam Tab 15 MG: ORAL | 30 days supply | Qty: 60 | Fill #0 | Status: AC

## 2020-12-14 ENCOUNTER — Telehealth: Payer: 59 | Admitting: Physician Assistant

## 2020-12-14 DIAGNOSIS — B3731 Acute candidiasis of vulva and vagina: Secondary | ICD-10-CM

## 2020-12-15 MED ORDER — FLUCONAZOLE 150 MG PO TABS: 150.0000 mg | ORAL_TABLET | Freq: Once | ORAL | 0 refills | Status: AC

## 2020-12-15 NOTE — Progress Notes (Signed)

## 2021-01-23 ENCOUNTER — Ambulatory Visit: Payer: 59 | Admitting: Obstetrics & Gynecology

## 2021-01-29 ENCOUNTER — Telehealth: Payer: 59 | Admitting: Nurse Practitioner

## 2021-01-29 ENCOUNTER — Other Ambulatory Visit: Payer: Self-pay

## 2021-01-29 DIAGNOSIS — N3000 Acute cystitis without hematuria: Secondary | ICD-10-CM | POA: Diagnosis not present

## 2021-01-29 MED ORDER — NITROFURANTOIN MONOHYD MACRO 100 MG PO CAPS
100.0000 mg | ORAL_CAPSULE | Freq: Two times a day (BID) | ORAL | 0 refills | Status: AC
  Filled 2021-01-29: qty 10, 5d supply, fill #0

## 2021-01-29 NOTE — Progress Notes (Signed)
E-Visit for Urinary Problems ° °We are sorry that you are not feeling well.  Here is how we plan to help! ° °Based on what you shared with me it looks like you most likely have a simple urinary tract infection. ° °A UTI (Urinary Tract Infection) is a bacterial infection of the bladder. ° °Most cases of urinary tract infections are simple to treat but a key part of your care is to encourage you to drink plenty of fluids and watch your symptoms carefully. ° °I have prescribed MacroBid 100 mg twice a day for 5 days.  Your symptoms should gradually improve. Call us if the burning in your urine worsens, you develop worsening fever, back pain or pelvic pain or if your symptoms do not resolve after completing the antibiotic. ° °Urinary tract infections can be prevented by drinking plenty of water to keep your body hydrated.  Also be sure when you wipe, wipe from front to back and don't hold it in!  If possible, empty your bladder every 4 hours. ° °HOME CARE °Drink plenty of fluids °Compete the full course of the antibiotics even if the symptoms resolve °Remember, when you need to go…go. Holding in your urine can increase the likelihood of getting a UTI! °GET HELP RIGHT AWAY IF: °You cannot urinate °You get a high fever °Worsening back pain occurs °You see blood in your urine °You feel sick to your stomach or throw up °You feel like you are going to pass out ° °MAKE SURE YOU  °Understand these instructions. °Will watch your condition. °Will get help right away if you are not doing well or get worse. ° ° °Thank you for choosing an e-visit. ° °Your e-visit answers were reviewed by a board certified advanced clinical practitioner to complete your personal care plan. Depending upon the condition, your plan could have included both over the counter or prescription medications. ° °Please review your pharmacy choice. Make sure the pharmacy is open so you can pick up prescription now. If there is a problem, you may contact your  provider through MyChart messaging and have the prescription routed to another pharmacy.  Your safety is important to us. If you have drug allergies check your prescription carefully.  ° °For the next 24 hours you can use MyChart to ask questions about today's visit, request a non-urgent call back, or ask for a work or school excuse. °You will get an email in the next two days asking about your experience. I hope that your e-visit has been valuable and will speed your recovery.  ° °I spent approximately 7 minutes reviewing the patient's history, current symptoms and coordinating their plan of care today.   ° °Meds ordered this encounter  °Medications  ° nitrofurantoin, macrocrystal-monohydrate, (MACROBID) 100 MG capsule  °  Sig: Take 1 capsule (100 mg total) by mouth 2 (two) times daily for 5 days.  °  Dispense:  10 capsule  °  Refill:  0  °  °

## 2021-04-20 ENCOUNTER — Telehealth: Payer: 59 | Admitting: Emergency Medicine

## 2021-04-20 ENCOUNTER — Other Ambulatory Visit: Payer: Self-pay

## 2021-04-20 DIAGNOSIS — B9789 Other viral agents as the cause of diseases classified elsewhere: Secondary | ICD-10-CM

## 2021-04-20 DIAGNOSIS — J019 Acute sinusitis, unspecified: Secondary | ICD-10-CM

## 2021-04-20 MED ORDER — AZELASTINE HCL 0.1 % NA SOLN
2.0000 | Freq: Two times a day (BID) | NASAL | 0 refills | Status: DC
  Filled 2021-04-20: qty 30, 30d supply, fill #0

## 2021-04-20 NOTE — Progress Notes (Signed)
E-Visit for Sinus Problems ? ?We are sorry that you are not feeling well.  Here is how we plan to help! ? ?Based on what you have shared with me it looks like you have sinusitis.  Sinusitis is inflammation and infection in the sinus cavities of the head.  Based on your presentation I believe you most likely have Acute Viral Sinusitis.This is an infection most likely caused by a virus. There is not specific treatment for viral sinusitis other than to help you with the symptoms until the infection runs its course.  You may use an oral decongestant such as Mucinex D or if you have glaucoma or high blood pressure use plain Mucinex. Saline nasal spray or saline irrigation such as with a Neti pot can help and can safely be used as often as needed for congestion, I have prescribed: Azelastine nasal spray 2 sprays in each nostril twice a day. Please also continue using your Flonase daily.   ? ?Some authorities believe that zinc sprays or the use of Echinacea may shorten the course of your symptoms. ? ?Sinus infections are not as easily transmitted as other respiratory infection, however we still recommend that you avoid close contact with loved ones, especially the very young and elderly.  Remember to wash your hands thoroughly throughout the day as this is the number one way to prevent the spread of infection! ? ?For your sore throat, use a saltwater gargle- ? to ? teaspoon of salt dissolved in a 4-ounce to 8-ounce glass of warm water.  Gargle the solution for approximately 15-30 seconds and then spit.  It is important not to swallow the solution.  You can also use throat lozenges/cough drops and Chloraseptic spray to help with throat pain or discomfort.  Warm or cold liquids can also be helpful in relieving throat pain. ? ?Home Care: ?Only take medications as instructed by your medical team. ?Do not take these medications with alcohol. ?A steam or ultrasonic humidifier can help congestion.  You can place a towel over your  head and breathe in the steam from hot water coming from a faucet. ?Avoid close contacts especially the very young and the elderly. ?Cover your mouth when you cough or sneeze. ?Always remember to wash your hands. ? ?Get Help Right Away If: ?You develop worsening fever or sinus pain. ?You develop a severe head ache or visual changes. ?Your symptoms persist after you have completed your treatment plan. ? ?Make sure you ?Understand these instructions. ?Will watch your condition. ?Will get help right away if you are not doing well or get worse. ? ? ?Thank you for choosing an e-visit. ? ?Your e-visit answers were reviewed by a board certified advanced clinical practitioner to complete your personal care plan. Depending upon the condition, your plan could have included both over the counter or prescription medications. ? ?Please review your pharmacy choice. Make sure the pharmacy is open so you can pick up prescription now. If there is a problem, you may contact your provider through [employer] and have the prescription routed to another pharmacy.  Your safety is important to [place]. If you have drug allergies check your prescription carefully.  ? ?For the next 24 hours you can use MyChart to ask questions about today's visit, request a non-urgent call back, or ask for a work or school excuse. ?You will get an email in the next two days asking about your experience. I hope that your e-visit has been valuable and will speed your recovery. ? ?  I have spent 5 minutes in review of e-visit questionnaire, review and updating patient chart, medical decision making and response to patient.  ? ?Willeen Cass, PhD, FNP-BC ?  ? ?

## 2021-04-24 IMAGING — CT CT ANGIO CHEST
2 of 6 series · 19 of 36 positions shown · IV contrast (APPLIED)
Comparison: None.

CLINICAL DATA: Chest pain

EXAM:
CT ANGIOGRAPHY CHEST WITH CONTRAST
TECHNIQUE: Multidetector CT imaging of the chest was performed using the
standard protocol during bolus administration of intravenous
contrast. Multiplanar CT image reconstructions and MIPs were
obtained to evaluate the vascular anatomy.
CONTRAST:  100mL OMNIPAQUE IOHEXOL 350 MG/ML SOLN

[Series 5: thins · axial · 0.69mm/px · z∈[-443,-227]mm · 18 of 242 slices shown]
[im 13/242  lung]
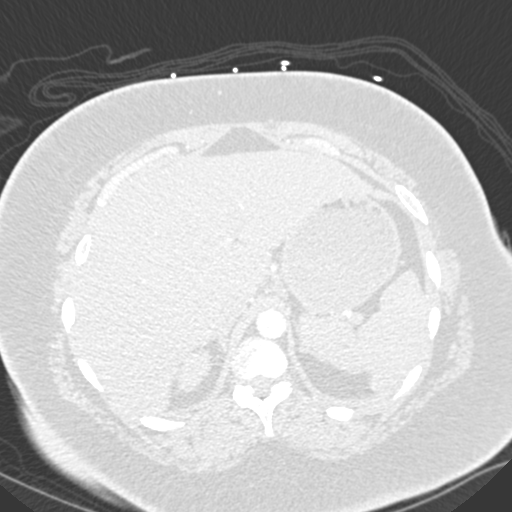
[im 25/242  mediastinal]
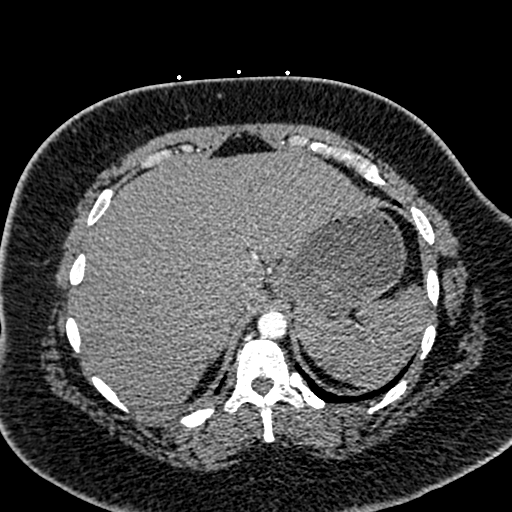
[im 37/242  lung]
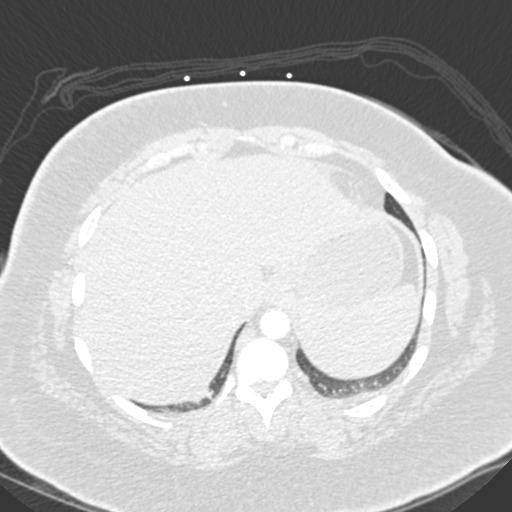
[im 49/242  mediastinal]
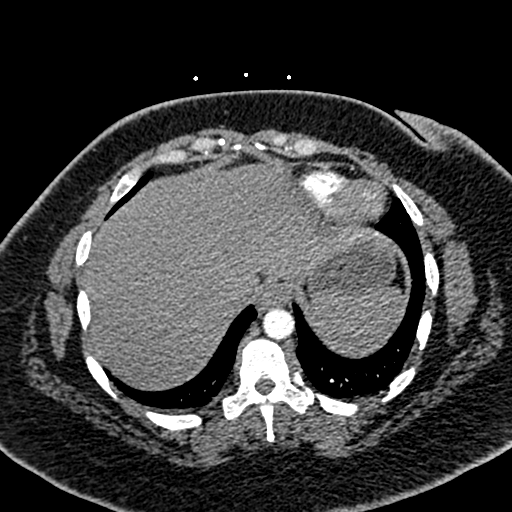
[im 61/242  lung]
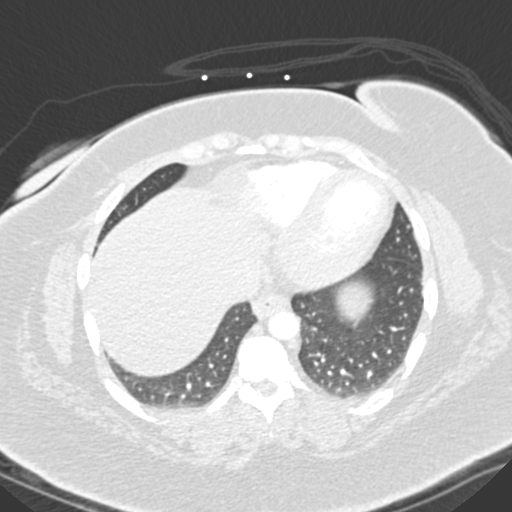
[im 73/242  mediastinal]
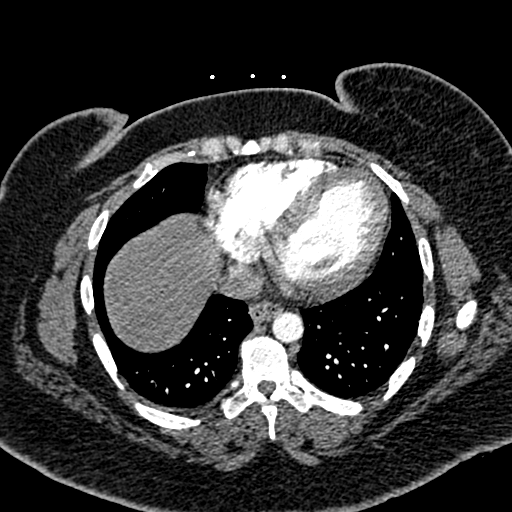
[im 85/242  lung]
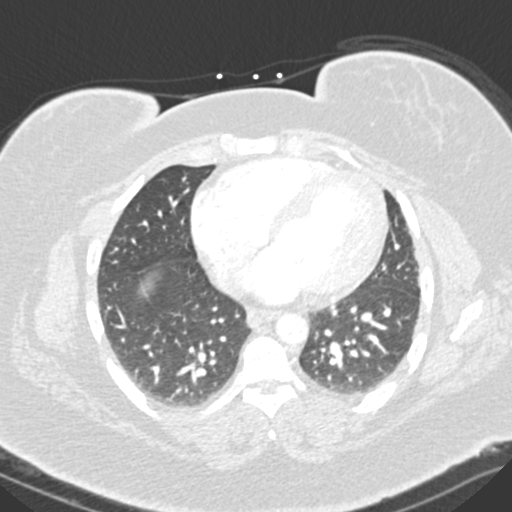
[im 97/242  mediastinal]
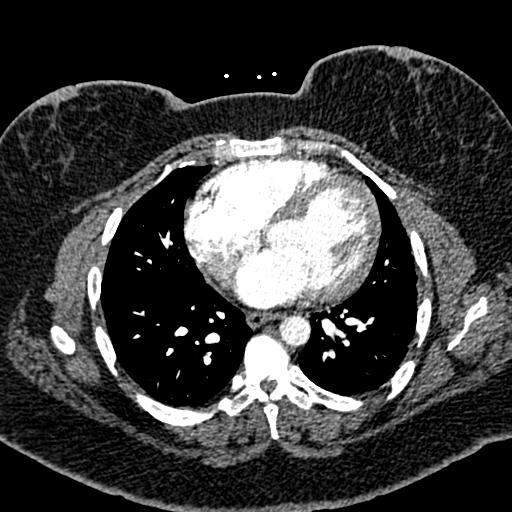
[im 109/242  lung]
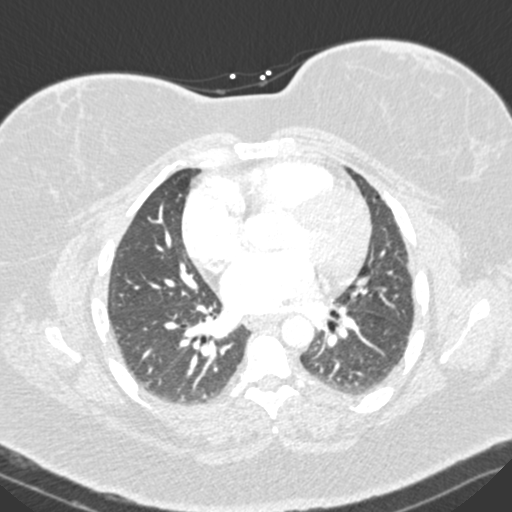
[im 133/242  mediastinal]
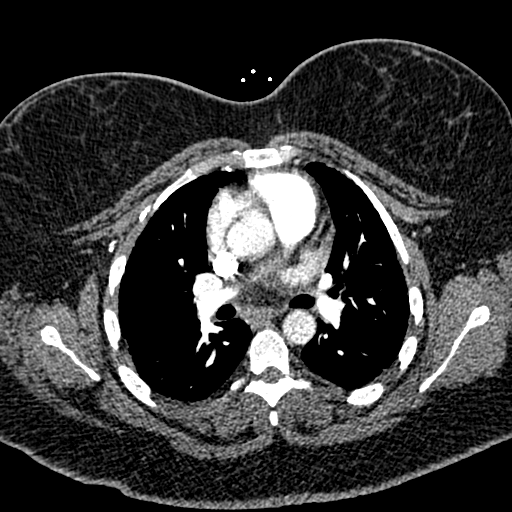
[im 145/242  lung]
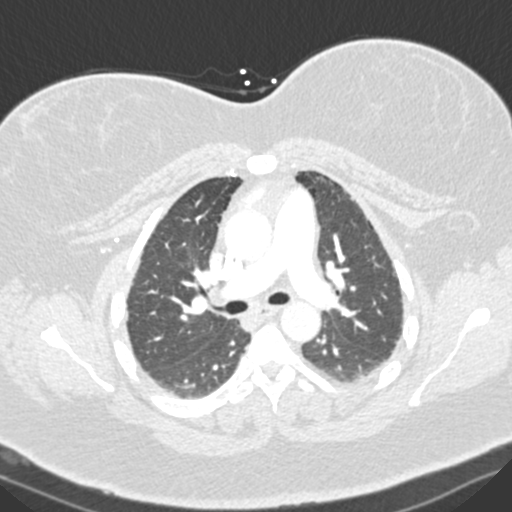
[im 157/242  mediastinal]
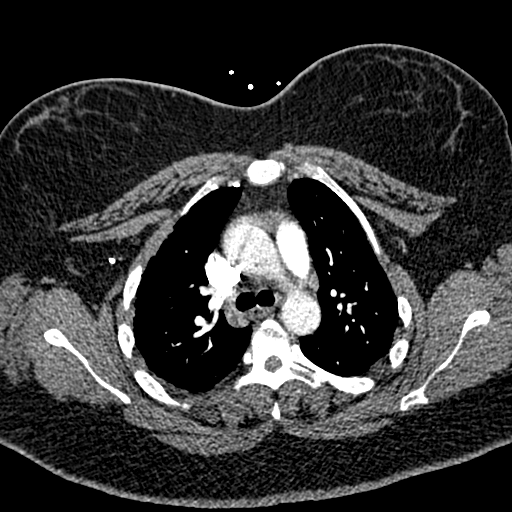
[im 169/242  lung]
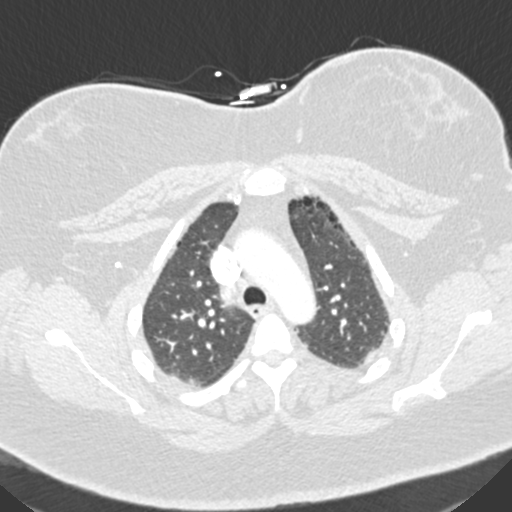
[im 181/242  mediastinal]
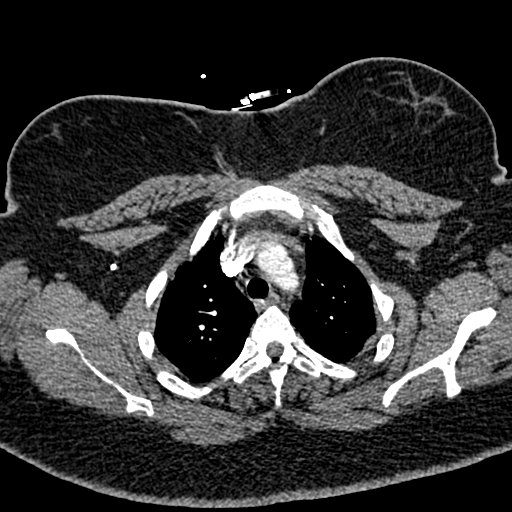
[im 193/242  lung]
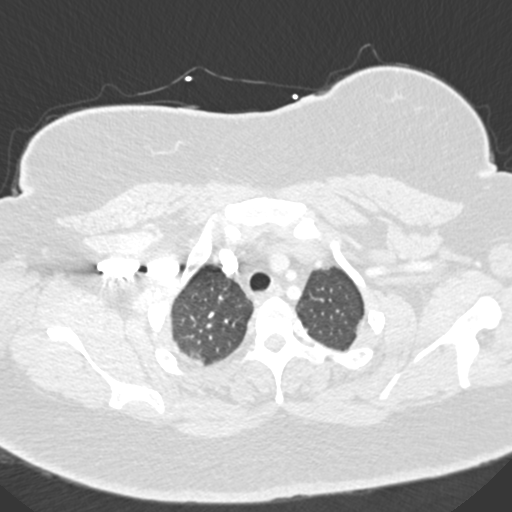
[im 205/242  mediastinal]
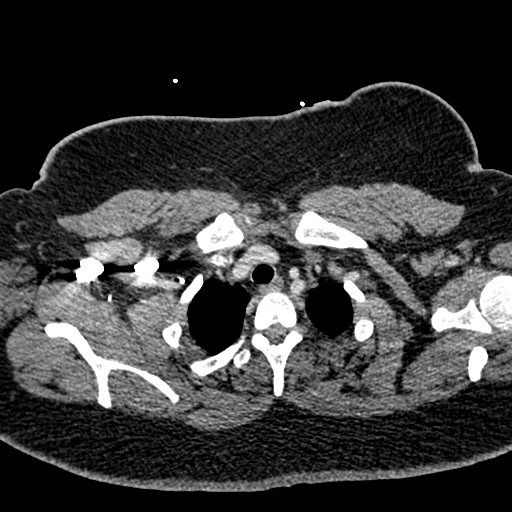
[im 217/242  lung]
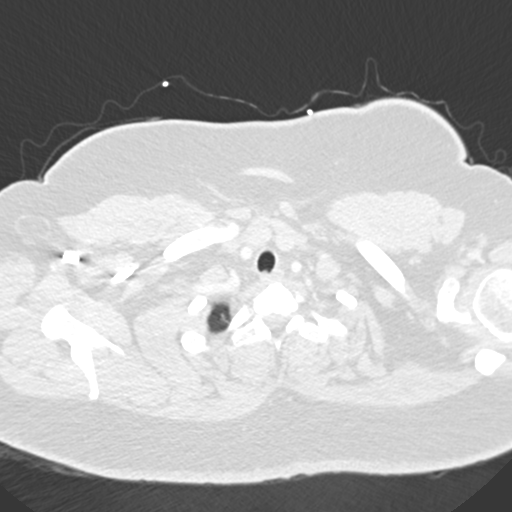
[im 229/242  mediastinal]
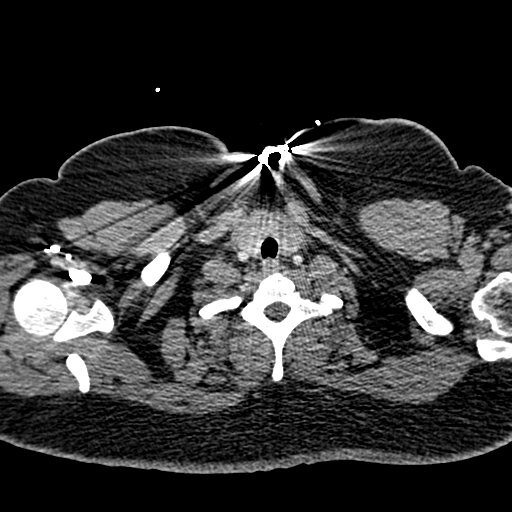

[Series 7: coronal mpr · coronal · 0.49mm/px · 1 of 89 slices shown]
[im 45/89  mediastinal]
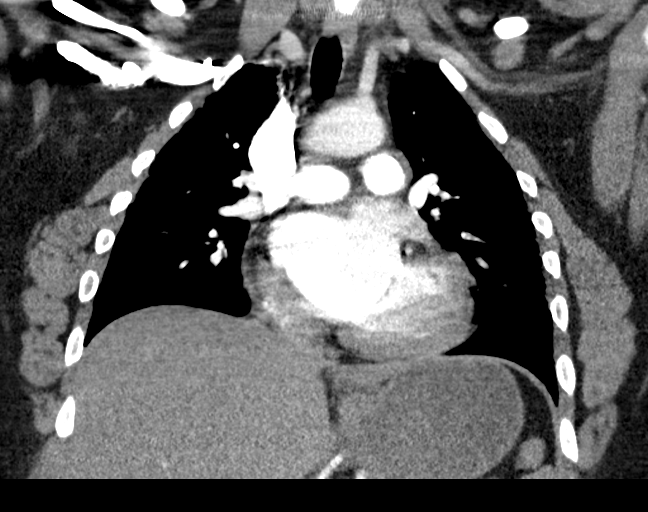

[19 of 36 positions shown; findings below may reference images not displayed]

FINDINGS: Cardiovascular: Thoracic aorta and its branches are within normal
limits. No dissection is seen. No cardiac enlargement is noted. The
pulmonary artery shows a normal branching pattern. No intraluminal
filling defect is noted to suggest pulmonary embolism.

Mediastinum/Nodes: Thoracic inlet is within normal limits. No
sizable hilar or mediastinal adenopathy is noted. The esophagus as
visualized is within normal limits.

Lungs/Pleura: Lungs are well aerated bilaterally. Very minimal
subpleural blebs are seen. No focal infiltrate or sizable effusion
is noted.

Upper Abdomen: Visualized upper abdomen is unremarkable.

Musculoskeletal: Mild degenerative change of the thoracic spine is
noted. No acute rib abnormality is noted.

Review of the MIP images confirms the above findings.
IMPRESSION: No evidence of pulmonary emboli.

No acute abnormality noted.

## 2021-04-28 ENCOUNTER — Other Ambulatory Visit (HOSPITAL_COMMUNITY)
Admission: RE | Admit: 2021-04-28 | Discharge: 2021-04-28 | Disposition: A | Discharge status: Home / Self Care | Payer: 59 | Source: Ambulatory Visit | Attending: Licensed Practical Nurse | Admitting: Licensed Practical Nurse

## 2021-04-28 ENCOUNTER — Other Ambulatory Visit: Payer: Self-pay

## 2021-04-28 ENCOUNTER — Encounter: Payer: Self-pay | Admitting: Licensed Practical Nurse

## 2021-04-28 ENCOUNTER — Ambulatory Visit (INDEPENDENT_AMBULATORY_CARE_PROVIDER_SITE_OTHER): Payer: 59 | Admitting: Licensed Practical Nurse

## 2021-04-28 VITALS — BP 122/70 | Ht 65.0 in | Wt 202.0 lb

## 2021-04-28 DIAGNOSIS — Z01419 Encounter for gynecological examination (general) (routine) without abnormal findings: Secondary | ICD-10-CM

## 2021-04-28 DIAGNOSIS — R5383 Other fatigue: Secondary | ICD-10-CM | POA: Diagnosis not present

## 2021-04-28 DIAGNOSIS — N920 Excessive and frequent menstruation with regular cycle: Secondary | ICD-10-CM

## 2021-04-28 DIAGNOSIS — Z113 Encounter for screening for infections with a predominantly sexual mode of transmission: Secondary | ICD-10-CM

## 2021-04-28 DIAGNOSIS — Z124 Encounter for screening for malignant neoplasm of cervix: Secondary | ICD-10-CM

## 2021-04-28 DIAGNOSIS — G4700 Insomnia, unspecified: Secondary | ICD-10-CM

## 2021-04-28 MED ORDER — ZOLPIDEM TARTRATE 5 MG PO TABS: 5.0000 mg | ORAL_TABLET | Freq: Every evening | ORAL | 0 refills | Status: DC | PRN

## 2021-04-28 MED ORDER — ZOLPIDEM TARTRATE 5 MG PO TABS
5.0000 mg | ORAL_TABLET | Freq: Every evening | ORAL | 0 refills | Status: DC | PRN
  Filled 2021-04-28: qty 14, 14d supply, fill #0

## 2021-04-28 NOTE — Progress Notes (Signed)
? ? ? ?Gynecology Annual Exam   ?PCP: Cletis Athens, MD ? ?Chief Complaint:  ?Chief Complaint  ?Patient presents with  ? Gynecologic Exam  ? ? ?History of Present Illness: Patient is a 31 y.o. G2P2002 presents for annual exam. The patient has fatigue and heavy menstrual bleeding  ? ?LMP: Patient's last menstrual period was 04/19/2021 (exact date). ?Average Interval: regular, 28 days ?Duration of flow: 7 days ?Heavy Menses: yes ?Clots: yes ?Intermenstrual Bleeding: no ?Postcoital Bleeding: no ?Dysmenorrhea: no ? ?The patient is sexually active with 1 female partner.  She currently uses tubal ligation for contraception. She has occasional dyspareunia.  The patient does perform self breast exams.  There is no notable family history of breast or ovarian cancer in her family. ? 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  When she gets off of work and goes home, she is able to fall asleep but wakes up after two hours and cannot fall back asleep, on nights that she does not work, she is up all night.  She has tried melatonin, benadryl and other antihistamines. She has blackout curtains in her room and does not use screens prior to sleep.  Is interested in trying Ambien.  ? ?Heavy cycles: had a uterine ablation few years ago, feels like her cycles are now heavier, they last 7 days, she needs to change super tampons 3-4 times a day, she does pass clots, she is tired of having this bleeding and would like it to stop.  Is open to hormonal treatment, unsure about a hysterectomy.  ? ?The patient wears seatbelts: yes.   The patient has regular exercise: no.  Does follow a particular diet, aware she should be following a diabetic diet but does not.  ? ?The patient denies current symptoms of depression.  Admits stress has been pretty high over the last few weeks d/t normal work-life events.  ?Lives with Husband and children, feels safe at home  ?Wears glasses, last exam a month ago ?Dentist: last  exam a week ago  ? ?Review of Systems: Review of Systems  ?Constitutional:  Positive for malaise/fatigue.  ? ?Past Medical History:  ?Patient Active Problem List  ? Diagnosis Date Noted  ? Obesity (BMI 30-39.9) 10/23/2019  ? Annual physical exam 10/09/2019  ? Menorrhagia with regular cycle 12/06/2018  ? Pre-existing diabetes mellitus in pregnancy 12/13/2016  ?  Current Diabetic Medications:  Metformin  ?Valu.Nieves ] Aspirin 81 mg daily after 12 weeks; discontinue after 36 weeks (? A2/B GDM) ?- metformin BID ? ?For A2/B GDM or higher classes of DM ?[X]  Diabetes Education and Testing Supplies ?[ ]  Nutrition Counsult ?[x]  Fetal ECHO after 22-24 weeks  ?[ ]  Eye exam for retina evaluation  ?[ ]  Baseline EKG ?[X]  US fetal growth every 4 weeks starting at 28 weeks  ?      05/04/2017 at 32 weeks EFW of 1901g or 4lbs 3oz c/w 55%ile ?[X]  Twice weekly NST starting at [redacted] weeks gestation ?[ ]  Delivery planning contingent on fetal growth, AFI, glycemic control, and other co-morbidities but at least by 39 weeks ? ?Baseline and surveillance labs (pulled in from [facility], refresh links as needed)  ?Lab Results  ?Component Value Date  ? CREATININE 0.71 03/13/2015  ? LABPROT 13.7 03/21/2008  ?No results found for: HGBA1C ? ?Antenatal Testing ?Class of DM U/S NST/AFI DELIVERY  ?Diabetes ?  A1 - good control - O24.410 ? ?  A2 - good control -  O24.419   ? ?  A2  - poor control or poor compliance - O24.419, E11.65 ?  (Macrosomia or polyhydramnios) **E11.65 is extra code for poor control** ? ?  A2/B - O24.919  and B-C O24.319 ? ?Poor control B-C or D-R-F-T - O24.319  or  Type I DM - O24.019  ?20-38 ? ?20-38 ? ?20-24-28-32-36 ? ? ?20-24-28-32-35-38//fetal echo ? ?20-24-27-30-33-36-38//fetal echo  ?40 ? ?32//2 x wk ? ?32//2 x wk ? ? ?32//2 x wk ? ?28//BPP wkly then 32//2 x wk  ?40 ? ?39 ? ?PRN ? ? ?68 ? ?PRN  ? ? ?  ? Diabetes mellitus, type 2 ([place]) 11/10/2016  ? ALLERGIC RHINITIS 03/21/2010  ?  Qualifier: Diagnosis of  ?BySabra Heck MD, J. Arnell Sieving ? ?   ? ? ?Past Surgical History:  ?Past Surgical History:  ?Procedure Laterality Date  ? CESAREAN SECTION  2010  ? failed VAVD  ? CESAREAN SECTION N/A 06/28/2017  ? Procedure: CESAREAN SECTION, BTL;  Surgeon: Malachy Mood, MD;  Location: [facility] ORS;  Service: Obstetrics;  Laterality: N/A;  ? FOOT SURGERY    ? nexplanon removal  07/2016  ? TUBAL LIGATION    ? ? ?Gynecologic History:  ?Patient's last menstrual period was 04/19/2021 (exact date). ?Contraception: tubal ligation ?Last Pap: Results were: no abnormalities 12/06/2018  ? ?Obstetric History: [id number] ? ?Family History:  ?Family History  ?Problem Relation Age of Onset  ? Diabetes Father   ? Heart failure Other   ? ? ?Social History:  ?Social History  ? ?Socioeconomic History  ? Marital status: Married  ?  Spouse name: Not on file  ? Number of children: Not on file  ? Years of education: Not on file  ? Highest education level: Not on file  ?Occupational History  ? Not on file  ?Tobacco Use  ? Smoking status: Never  ? Smokeless tobacco: Never  ?Vaping Use  ? Vaping Use: Never used  ?Substance and Sexual Activity  ? Alcohol use: No  ? Drug use: No  ? Sexual activity: Yes  ?  Birth control/protection: Surgical  ?Other Topics Concern  ? Not on file  ?Social History Narrative  ? Not on file  ? ?Social Determinants of Health  ? ?Financial Resource Strain: Not on file  ?Food Insecurity: Not on file  ?Transportation Needs: Not on file  ?Physical Activity: Not on file  ?Stress: Not on file  ?Social Connections: Not on file  ?Intimate Partner Violence: Not on file  ? ? ?Allergies:  ?Allergies  ?Allergen Reactions  ? Fish Allergy Anaphylaxis  ? Peanut-Containing Drug Products Anaphylaxis  ? Shellfish Allergy Anaphylaxis  ? Sulfa Antibiotics Nausea And Vomiting  ? ? ?Medications: ?Prior to Admission medications   ?Medication Sig Start Date End Date Taking? Authorizing Provider  ?influenza vac split quadrivalent PF (FLUARIX QUADRIVALENT) 0.5 ML injection Inject into the muscle.  10/21/20  Yes Carlyle Basques, MD  ?Insulin Pen Needle (PEN NEEDLES) 33G X 4 MM MISC 1 each by Does not apply route daily before breakfast. 10/09/19  Yes Masoud, Viann Shove, MD  ?azelastine (ASTELIN) 0.1 % nasal spray Place 2 sprays into both nostrils 2 (two) times daily. Use in each nostril as directed ?Patient not taking: Reported on 04/28/2021 04/20/21   Carvel Getting, NP  ?cephALEXin (KEFLEX) 500 MG capsule Take 1 capsule (500 mg total) by mouth 2 (two) times daily. 07/10/20   Mar Daring, PA-C  ?JANUVIA 100 MG tablet TAKE 1 TABLET (100 MG TOTAL) BY  MOUTH DAILY. 07/16/19 08/09/20  Cletis Athens, MD  ?lisinopril (ZESTRIL) 10 MG tablet Take 1 tablet (10 mg total) by mouth daily. ?Patient not taking: Reported on 04/28/2021 10/09/19   Cletis Athens, MD  ?rosuvastatin (CRESTOR) 10 MG tablet Take 1 tablet (10 mg total) by mouth daily. ?Patient not taking: Reported on 04/28/2021 10/09/19   Cletis Athens, MD  ? ? ?Physical Exam ?Vitals: Blood pressure 122/70, height 5\' 5"  (1.651 m), weight 202 lb (91.6 kg), last menstrual period 04/19/2021. ? ?General: NAD ?HEENT: normocephalic, anicteric ?Thyroid: no enlargement, no palpable nodules ?Pulmonary: No increased work of breathing, CTAB ?Cardiovascular: RRR, distal pulses 2+ ?Breast: Breast symmetrical, no tenderness, no palpable nodules or masses, no skin or nipple retraction present, no nipple discharge.  No axillary or supraclavicular lymphadenopathy. ?Abdomen: NABS, soft, non-tender, non-distended.  Umbilicus without lesions.  No hepatomegaly, splenomegaly or masses palpable. No evidence of hernia  ?Genitourinary: ? External: Normal external female genitalia.  Normal urethral meatus, normal Bartholin's and Skene's glands.   ? Vagina: Normal vaginal mucosa, no evidence of prolapse.   ? Cervix: Grossly normal in appearance, no bleeding ? Uterus: Non-enlarged, mobile, normal contour.  No CMT ? Adnexa: ovaries non-enlarged, no adnexal masses ? Rectal: deferred ? Lymphatic: no  evidence of inguinal lymphadenopathy ?Extremities: no edema, erythema, or tenderness ?Neurologic: Grossly intact ?Psychiatric: mood appropriate, affect full ? ? ?Assessment: 31 y.o. G2P2002 routine annual exa

## 2021-04-29 ENCOUNTER — Other Ambulatory Visit: Payer: Self-pay

## 2021-04-29 LAB — CBC WITH DIFFERENTIAL/PLATELET
Basophils Absolute: 0 10*3/uL (ref 0.0–0.2)
Basos: 1 %
EOS (ABSOLUTE): 0.2 10*3/uL (ref 0.0–0.4)
Eos: 2 %
Hematocrit: 39.6 % (ref 34.0–46.6)
Hemoglobin: 14 g/dL (ref 11.1–15.9)
Immature Grans (Abs): 0 10*3/uL (ref 0.0–0.1)
Immature Granulocytes: 0 %
Lymphocytes Absolute: 2 10*3/uL (ref 0.7–3.1)
Lymphs: 24 %
MCH: 31.9 pg (ref 26.6–33.0)
MCHC: 35.4 g/dL (ref 31.5–35.7)
MCV: 90 fL (ref 79–97)
Monocytes Absolute: 0.5 10*3/uL (ref 0.1–0.9)
Monocytes: 6 %
Neutrophils Absolute: 5.7 10*3/uL (ref 1.4–7.0)
Neutrophils: 67 %
Platelets: 245 10*3/uL (ref 150–450)
RBC: 4.39 x10E6/uL (ref 3.77–5.28)
RDW: 12.4 % (ref 11.7–15.4)
WBC: 8.4 10*3/uL (ref 3.4–10.8)

## 2021-04-29 LAB — HEP, RPR, HIV PANEL
HIV Screen 4th Generation wRfx: NONREACTIVE
Hepatitis B Surface Ag: NEGATIVE
RPR Ser Ql: NONREACTIVE

## 2021-05-04 LAB — CYTOLOGY - PAP
Chlamydia: NEGATIVE
Comment: NEGATIVE
Comment: NEGATIVE
Comment: NEGATIVE
Comment: NORMAL
Diagnosis: NEGATIVE
Diagnosis: REACTIVE
High risk HPV: NEGATIVE
Neisseria Gonorrhea: NEGATIVE
Trichomonas: NEGATIVE

## 2021-05-07 ENCOUNTER — Ambulatory Visit: Payer: 59 | Admitting: Nurse Practitioner

## 2021-05-08 ENCOUNTER — Telehealth: Payer: 59 | Admitting: Physician Assistant

## 2021-05-08 ENCOUNTER — Ambulatory Visit: Payer: 59 | Admitting: Nurse Practitioner

## 2021-05-08 DIAGNOSIS — R3989 Other symptoms and signs involving the genitourinary system: Secondary | ICD-10-CM | POA: Diagnosis not present

## 2021-05-08 MED ORDER — CEPHALEXIN 500 MG PO CAPS: 500.0000 mg | ORAL_CAPSULE | Freq: Two times a day (BID) | ORAL | 0 refills | Status: DC

## 2021-05-08 NOTE — Progress Notes (Signed)

## 2021-07-13 LAB — FETAL NONSTRESS TEST

## 2021-07-26 ENCOUNTER — Telehealth: Payer: 59 | Admitting: Family

## 2021-07-26 DIAGNOSIS — R350 Frequency of micturition: Secondary | ICD-10-CM

## 2021-07-26 DIAGNOSIS — N898 Other specified noninflammatory disorders of vagina: Secondary | ICD-10-CM

## 2021-07-26 NOTE — Progress Notes (Signed)
Because you are having vaginal discharge and UTI symptoms, I feel your condition warrants further evaluation and I recommend that you be seen in a face to face visit so they can do testing and make sure what the exact issue is.    NOTE: There will be NO CHARGE for this eVisit   If you are having a true medical emergency please call 911.      For an urgent face to face visit, [place] has seven urgent care centers for your convenience:     [facility] Health Urgent Care Center at [facility] Directions [phone] [address] [place], [place] [address]    [facility] Health Urgent Care Center [facility]) Get Driving Directions [phone] [address] [place], [place] [address]  [facility] Health Urgent Care Center [facility] [place]) Get Driving Directions [phone] [address] [place],  [place]  [address]  [facility] Health Urgent Care Center [facility] Directions  [phone] [address] [place],  [place] [address]   [facility] Health Urgent Care at [facility] Get Driving Directions [phone] [place], [place] [address]   [facility] Health Urgent Care at [facility] Get Driving Directions  [phone] [address] [place], [place] [address]   [facility] Health Urgent [facility] at [facility] Directions [phone] [address] [place], [place] [address]  Your MyChart E-visit questionnaire answers were reviewed by a board certified advanced clinical practitioner to complete your personal care plan based on your specific symptoms.  Thank you for using e-Visits.

## 2021-09-20 ENCOUNTER — Other Ambulatory Visit: Payer: Self-pay

## 2021-09-20 ENCOUNTER — Telehealth: Payer: 59 | Admitting: Physician Assistant

## 2021-09-20 DIAGNOSIS — B3731 Acute candidiasis of vulva and vagina: Secondary | ICD-10-CM

## 2021-09-20 MED ORDER — FLUCONAZOLE 150 MG PO TABS
150.0000 mg | ORAL_TABLET | ORAL | 0 refills | Status: DC | PRN
  Filled 2021-09-20: qty 2, 6d supply, fill #0

## 2021-09-20 NOTE — Progress Notes (Signed)

## 2021-09-21 ENCOUNTER — Other Ambulatory Visit: Payer: Self-pay

## 2021-10-08 ENCOUNTER — Ambulatory Visit (INDEPENDENT_AMBULATORY_CARE_PROVIDER_SITE_OTHER): Payer: 59 | Admitting: Nurse Practitioner

## 2021-10-08 ENCOUNTER — Other Ambulatory Visit: Payer: Self-pay

## 2021-10-08 ENCOUNTER — Encounter: Payer: Self-pay | Admitting: Nurse Practitioner

## 2021-10-08 VITALS — BP 132/81 | HR 92 | Ht 65.0 in | Wt 197.9 lb

## 2021-10-08 DIAGNOSIS — E119 Type 2 diabetes mellitus without complications: Secondary | ICD-10-CM | POA: Diagnosis not present

## 2021-10-08 DIAGNOSIS — E669 Obesity, unspecified: Secondary | ICD-10-CM

## 2021-10-08 DIAGNOSIS — I10 Essential (primary) hypertension: Secondary | ICD-10-CM | POA: Diagnosis not present

## 2021-10-08 DIAGNOSIS — Z6832 Body mass index (BMI) 32.0-32.9, adult: Secondary | ICD-10-CM | POA: Diagnosis not present

## 2021-10-08 LAB — POCT GLYCOSYLATED HEMOGLOBIN (HGB A1C): HbA1c POC (<> result, manual entry): 10.4 % (ref 4.0–5.6)

## 2021-10-08 LAB — GLUCOSE, POCT (MANUAL RESULT ENTRY): POC Glucose: 302 mg/dl — AB (ref 70–99)

## 2021-10-08 MED ORDER — METFORMIN HCL 500 MG PO TABS
500.0000 mg | ORAL_TABLET | Freq: Two times a day (BID) | ORAL | 3 refills | Status: DC
  Filled 2021-10-08: qty 180, 90d supply, fill #0

## 2021-10-08 MED ORDER — SEMAGLUTIDE (1 MG/DOSE) 4 MG/3ML ~~LOC~~ SOPN
0.2500 mg | PEN_INJECTOR | SUBCUTANEOUS | 3 refills | Status: DC
  Filled 2021-10-08 – 2021-10-09 (×2): qty 3, 28d supply, fill #0

## 2021-10-08 MED ORDER — SITAGLIPTIN PHOSPHATE 50 MG PO TABS
50.0000 mg | ORAL_TABLET | Freq: Every day | ORAL | 1 refills | Status: DC
  Filled 2021-10-08: qty 30, 30d supply, fill #0

## 2021-10-08 NOTE — Assessment & Plan Note (Signed)
Body mass index is 32.93 kg/m. Advised pt to lose weight. Advised patient to avoid trans fat, fatty and fried food. Follow a regular physical activity schedule. Went over the risk of chronic diseases with increased weight.

## 2021-10-08 NOTE — Assessment & Plan Note (Addendum)
Blood pressure 132/81 in the office today. Patient blood pressure is controlled by lifestyle intervention. Patient is not taking any medication at present. We will continue to monitor if needed we will start her on medication. Labs ordered

## 2021-10-08 NOTE — Progress Notes (Signed)
Established Patient Office Visit  Subjective:  Patient ID: Cheryl Carpenter, female    DOB: 07/18/1990  Age: 31 y.o. MRN: [id number]  CC:  Chief Complaint  Patient presents with   Diabetes     HPI  [place] presents for routine follow-up.  Patient has history of diabetes, hypertension and hyperlipidemia. She is not taking any medication at present.   Diabetes Pertinent negatives for hypoglycemia include no confusion. Pertinent negatives for diabetes include no chest pain.     Past Medical History:  Diagnosis Date   Diabetes mellitus without complication (HCC)    Obesity affecting pregnancy, antepartum 05/30/2017    Past Surgical History:  Procedure Laterality Date   CESAREAN SECTION  2010   failed VAVD   CESAREAN SECTION N/A 06/28/2017   Procedure: CESAREAN SECTION, BTL;  Surgeon: Vena Austria, MD;  Location: [facility] ORS;  Service: Obstetrics;  Laterality: N/A;   FOOT SURGERY     nexplanon removal  07/2016   TUBAL LIGATION      Family History  Problem Relation Age of Onset   Diabetes Father    Heart failure Other     Social History   Socioeconomic History   Marital status: Married    Spouse name: Not on file   Number of children: Not on file   Years of education: Not on file   Highest education level: Not on file  Occupational History   Not on file  Tobacco Use   Smoking status: Never   Smokeless tobacco: Never  Vaping Use   Vaping Use: Never used  Substance and Sexual Activity   Alcohol use: No   Drug use: No   Sexual activity: Yes    Birth control/protection: Surgical  Other Topics Concern   Not on file  Social History Narrative   Not on file   Social Determinants of Health   Financial Resource Strain: Not on file  Food Insecurity: Not on file  Transportation Needs: Not on file  Physical Activity: Not on file  Stress: Not on file  Social Connections: Not on file  Intimate Partner Violence: Not on file     Outpatient Medications  Prior to Visit  Medication Sig Dispense Refill   Insulin Pen Needle (PEN NEEDLES) 33G X 4 MM MISC 1 each by Does not apply route daily before breakfast. 100 each 6   fluconazole (DIFLUCAN) 150 MG tablet Take 1 tablet (150 mg total) by mouth every 3 (three) days as needed. 2 tablet 0   influenza vac split quadrivalent PF (FLUARIX QUADRIVALENT) 0.5 ML injection Inject into the muscle. 0.5 mL 0   JANUVIA 100 MG tablet TAKE 1 TABLET (100 MG TOTAL) BY MOUTH DAILY. 90 tablet 3   lisinopril (ZESTRIL) 10 MG tablet Take 1 tablet (10 mg total) by mouth daily. (Patient not taking: Reported on 04/28/2021) 90 tablet 3   rosuvastatin (CRESTOR) 10 MG tablet Take 1 tablet (10 mg total) by mouth daily. (Patient not taking: Reported on 04/28/2021) 90 tablet 3   zolpidem (AMBIEN) 5 MG tablet Take 1 tablet (5 mg total) by mouth at bedtime as needed for up to 14 days for sleep. 14 tablet 0   No facility-administered medications prior to visit.    Allergies  Allergen Reactions   Fish Allergy Anaphylaxis   Peanut-Containing Drug Products Anaphylaxis   Shellfish Allergy Anaphylaxis   Sulfa Antibiotics Nausea And Vomiting    ROS Review of Systems  Constitutional: Negative.   HENT: Negative.  Eyes: Negative.   Respiratory:  Negative for apnea and shortness of breath.   Cardiovascular:  Negative for chest pain and palpitations.  Gastrointestinal: Negative.   Genitourinary: Negative.   Musculoskeletal: Negative.   Neurological: Negative.   Psychiatric/Behavioral:  Negative for agitation, behavioral problems and confusion.       Objective:    Physical Exam Constitutional:      Appearance: Normal appearance. She is obese.  HENT:     Head: Normocephalic.     Right Ear: Tympanic membrane normal.     Left Ear: Tympanic membrane normal.     Nose: Nose normal.     Mouth/Throat:     Mouth: Mucous membranes are moist.     Pharynx: Oropharynx is clear.  Eyes:     Extraocular Movements: Extraocular  movements intact.     Conjunctiva/sclera: Conjunctivae normal.     Pupils: Pupils are equal, round, and reactive to light.  Cardiovascular:     Rate and Rhythm: Normal rate and regular rhythm.     Pulses: Normal pulses.     Heart sounds: Normal heart sounds.  Pulmonary:     Effort: Pulmonary effort is normal. No respiratory distress.     Breath sounds: Normal breath sounds. No rhonchi.  Abdominal:     General: Bowel sounds are normal.     Palpations: Abdomen is soft. There is no mass.     Tenderness: There is no abdominal tenderness.     Hernia: No hernia is present.  Musculoskeletal:        General: Normal range of motion.     Cervical back: Neck supple. No tenderness.  Skin:    General: Skin is warm.     Capillary Refill: Capillary refill takes less than 2 seconds.  Neurological:     General: No focal deficit present.     Mental Status: She is alert and oriented to person, place, and time. Mental status is at baseline.  Psychiatric:        Mood and Affect: Mood normal.        Behavior: Behavior normal.        Thought Content: Thought content normal.        Judgment: Judgment normal.     BP 132/81   Pulse 92   Ht 5\' 5"  (1.651 m)   Wt 197 lb 14.4 oz (89.8 kg)   BMI 32.93 kg/m  Wt Readings from Last 3 Encounters:  10/08/21 197 lb 14.4 oz (89.8 kg)  04/28/21 202 lb (91.6 kg)  05/05/20 204 lb (92.5 kg)     Health Maintenance  Topic Date Due   FOOT EXAM  Never done   OPHTHALMOLOGY EXAM  Never done   Hepatitis C Screening  Never done   Diabetic kidney evaluation - Urine ACR  04/25/2019   COVID-19 Vaccine (3 - Moderna series) 10/01/2019   Diabetic kidney evaluation - GFR measurement  08/06/2020   INFLUENZA VACCINE  08/25/2021   HEMOGLOBIN A1C  04/08/2022   PAP SMEAR-Modifier  04/28/2024   TETANUS/TDAP  04/21/2027   HIV Screening  Completed   HPV VACCINES  Aged Out    There are no preventive care reminders to display for this patient.  No results found for:  "TSH" Lab Results  Component Value Date   WBC 8.4 04/28/2021   HGB 14.0 04/28/2021   HCT 39.6 04/28/2021   MCV 90 04/28/2021   PLT 245 04/28/2021   Lab Results  Component Value Date   NA 139  08/07/2019   K 3.9 08/07/2019   CO2 27 08/07/2019   GLUCOSE 139 (H) 08/07/2019   BUN 12 08/07/2019   CREATININE 0.88 08/07/2019   BILITOT 0.7 08/07/2019   ALKPHOS 44 08/07/2019   AST 16 08/07/2019   ALT 21 08/07/2019   PROT 8.3 (H) 08/07/2019   ALBUMIN 4.1 08/07/2019   CALCIUM 9.2 08/07/2019   ANIONGAP 7 08/07/2019   No results found for: "CHOL" No results found for: "HDL" No results found for: "LDLCALC" No results found for: "TRIG" No results found for: "CHOLHDL" Lab Results  Component Value Date   HGBA1C 10.4 10/08/2021      Assessment & Plan:   Problem List Items Addressed This Visit       Cardiovascular and Mediastinum   Hypertension    Blood pressure 132/81 in the office today. Patient blood pressure is controlled by lifestyle intervention. Patient is not taking any medication at present. We will continue to monitor if needed we will start her on medication. Labs ordered      Relevant Orders   CBC with Differential/Platelet   COMPLETE METABOLIC PANEL WITH GFR   Lipid panel   TSH     Endocrine   Diabetes mellitus, type 2 (HCC) - Primary    Patient hemoglobin A1c 10.4 and blood sugar 302 in the office today. Patient is not taking any medication at present. Started her on metformin and and Ozempic. Advised her to check the BS regularly, make a log and bring to next appointment.  Advised her to eat variety of food including fruits, vegetables, whole grains, complex carbohydrates and proteins.  Addendum: Patient insurance is not covering Ozempic and will start her on Januvia.  Patient has allergy to sulfa and not a candidate for sulfonylureas.          Relevant Medications   Semaglutide, 1 MG/DOSE, 4 MG/3ML SOPN   metFORMIN (GLUCOPHAGE) 500 MG tablet    sitaGLIPtin (JANUVIA) 50 MG tablet   Other Relevant Orders   POCT glucose (manual entry) (Completed)   POCT HgB A1C (Completed)   Lipid panel     Other   Class 1 obesity without serious comorbidity with body mass index (BMI) of 32.0 to 32.9 in adult    Body mass index is 32.93 kg/m. Advised pt to lose weight. Advised patient to avoid trans fat, fatty and fried food. Follow a regular physical activity schedule. Went over the risk of chronic diseases with increased weight.           Relevant Medications   Semaglutide, 1 MG/DOSE, 4 MG/3ML SOPN   metFORMIN (GLUCOPHAGE) 500 MG tablet   sitaGLIPtin (JANUVIA) 50 MG tablet     Meds ordered this encounter  Medications   Semaglutide, 1 MG/DOSE, 4 MG/3ML SOPN    Sig: Inject 0.25 mg as directed once a week.    Dispense:  3 mL    Refill:  3   metFORMIN (GLUCOPHAGE) 500 MG tablet    Sig: Take 1 tablet (500 mg total) by mouth 2 (two) times daily with a meal.    Dispense:  180 tablet    Refill:  3   sitaGLIPtin (JANUVIA) 50 MG tablet    Sig: Take 1 tablet (50 mg total) by mouth daily.    Dispense:  30 tablet    Refill:  1     Follow-up: Return in about 2 weeks (around 10/22/2021).    Kara Dies, NP

## 2021-10-08 NOTE — Assessment & Plan Note (Addendum)
Patient hemoglobin A1c 10.4 and blood sugar 302 in the office today. Patient is not taking any medication at present. Started her on metformin and and Ozempic. Advised her to check the BS regularly, make a log and bring to next appointment.  Advised her to eat variety of food including fruits, vegetables, whole grains, complex carbohydrates and proteins.  Addendum: Patient insurance is not covering Ozempic and will start her on Januvia.  Patient has allergy to sulfa and not a candidate for sulfonylureas.

## 2021-10-09 ENCOUNTER — Other Ambulatory Visit: Payer: Self-pay

## 2021-10-13 ENCOUNTER — Ambulatory Visit (INDEPENDENT_AMBULATORY_CARE_PROVIDER_SITE_OTHER): Payer: 59 | Admitting: *Deleted

## 2021-10-13 ENCOUNTER — Other Ambulatory Visit: Payer: Self-pay

## 2021-10-13 DIAGNOSIS — E119 Type 2 diabetes mellitus without complications: Secondary | ICD-10-CM | POA: Diagnosis not present

## 2021-10-13 DIAGNOSIS — Z0000 Encounter for general adult medical examination without abnormal findings: Secondary | ICD-10-CM | POA: Diagnosis not present

## 2021-10-13 DIAGNOSIS — I10 Essential (primary) hypertension: Secondary | ICD-10-CM | POA: Diagnosis not present

## 2021-10-14 LAB — CBC WITH DIFFERENTIAL/PLATELET
Absolute Monocytes: 348 cells/uL (ref 200–950)
Basophils Absolute: 32 cells/uL (ref 0–200)
Basophils Relative: 0.4 %
Eosinophils Absolute: 170 cells/uL (ref 15–500)
Eosinophils Relative: 2.1 %
HCT: 38.5 % (ref 35.0–45.0)
Hemoglobin: 13.6 g/dL (ref 11.7–15.5)
Lymphs Abs: 1750 cells/uL (ref 850–3900)
MCH: 32.3 pg (ref 27.0–33.0)
MCHC: 35.3 g/dL (ref 32.0–36.0)
MCV: 91.4 fL (ref 80.0–100.0)
MPV: 11 fL (ref 7.5–12.5)
Monocytes Relative: 4.3 %
Neutro Abs: 5800 cells/uL (ref 1500–7800)
Neutrophils Relative %: 71.6 %
Platelets: 216 10*3/uL (ref 140–400)
RBC: 4.21 10*6/uL (ref 3.80–5.10)
RDW: 12.9 % (ref 11.0–15.0)
Total Lymphocyte: 21.6 %
WBC: 8.1 10*3/uL (ref 3.8–10.8)

## 2021-10-14 LAB — TSH: TSH: 1.84 mIU/L

## 2021-10-14 LAB — COMPLETE METABOLIC PANEL WITH GFR
AG Ratio: 1.3 (calc) (ref 1.0–2.5)
ALT: 17 U/L (ref 6–29)
AST: 19 U/L (ref 10–30)
Albumin: 4 g/dL (ref 3.6–5.1)
Alkaline phosphatase (APISO): 60 U/L (ref 31–125)
BUN: 10 mg/dL (ref 7–25)
CO2: 20 mmol/L (ref 20–32)
Calcium: 9.4 mg/dL (ref 8.6–10.2)
Chloride: 100 mmol/L (ref 98–110)
Creat: 0.64 mg/dL (ref 0.50–0.97)
Globulin: 3 g/dL (calc) (ref 1.9–3.7)
Glucose, Bld: 339 mg/dL — ABNORMAL HIGH (ref 65–99)
Potassium: 4 mmol/L (ref 3.5–5.3)
Sodium: 133 mmol/L — ABNORMAL LOW (ref 135–146)
Total Bilirubin: 0.4 mg/dL (ref 0.2–1.2)
Total Protein: 7 g/dL (ref 6.1–8.1)
eGFR: 120 mL/min/{1.73_m2} (ref >=60)

## 2021-10-14 LAB — LIPID PANEL
Cholesterol: 114 mg/dL (ref <200)
HDL: 15 mg/dL — ABNORMAL LOW (ref >=50)
Non-HDL Cholesterol (Calc): 99 mg/dL (calc) (ref <130)
Total CHOL/HDL Ratio: 7.6 (calc) — ABNORMAL HIGH (ref <5.0)
Triglycerides: 1183 mg/dL — ABNORMAL HIGH (ref <150)

## 2021-10-17 ENCOUNTER — Other Ambulatory Visit: Payer: Self-pay | Admitting: Nurse Practitioner

## 2021-10-17 MED ORDER — FENOFIBRATE 145 MG PO TABS
145.0000 mg | ORAL_TABLET | Freq: Every day | ORAL | 2 refills | Status: DC
  Filled 2021-10-17: qty 30, 30d supply, fill #0

## 2021-10-18 ENCOUNTER — Other Ambulatory Visit: Payer: Self-pay

## 2021-10-19 ENCOUNTER — Other Ambulatory Visit: Payer: Self-pay

## 2021-10-22 ENCOUNTER — Other Ambulatory Visit: Payer: Self-pay

## 2021-10-22 ENCOUNTER — Other Ambulatory Visit: Payer: Self-pay | Admitting: *Deleted

## 2021-10-22 ENCOUNTER — Encounter: Payer: Self-pay | Admitting: Nurse Practitioner

## 2021-10-22 ENCOUNTER — Ambulatory Visit (INDEPENDENT_AMBULATORY_CARE_PROVIDER_SITE_OTHER): Payer: 59 | Admitting: Nurse Practitioner

## 2021-10-22 DIAGNOSIS — E119 Type 2 diabetes mellitus without complications: Secondary | ICD-10-CM | POA: Diagnosis not present

## 2021-10-22 LAB — GLUCOSE, POCT (MANUAL RESULT ENTRY): POC Glucose: 416 mg/dl — AB (ref 70–99)

## 2021-10-22 MED ORDER — SEMAGLUTIDE(0.25 OR 0.5MG/DOS) 2 MG/3ML ~~LOC~~ SOPN
0.2500 mg | PEN_INJECTOR | SUBCUTANEOUS | 3 refills | Status: DC
  Filled 2021-10-22: qty 3, 56d supply, fill #0
  Filled 2021-12-10: qty 3, 56d supply, fill #1
  Filled 2022-01-11: qty 3, 56d supply, fill #2

## 2021-10-22 NOTE — Assessment & Plan Note (Signed)
Her blood sugar at office is 416 the office today. Started patient on Ozempic once a week injection. Continue metformin. If needed will change to medication regimen during the next appointment.

## 2021-10-22 NOTE — Progress Notes (Signed)
Established Patient Office Visit  Subjective:  Patient ID: Cheryl Carpenter, female    DOB: 02/01/1990  Age: 31 y.o. MRN: [id number]  CC:  Chief Complaint  Patient presents with   Diabetes     HPI  [place] presents for routine follow-up on diabetes. Her BS at home average in the high 200's.  She has not started taking fenofibrate yet.  She is taking metformin and that is giving her GI side effect.  Diabetes She presents for her follow-up diabetic visit. She has type 2 diabetes mellitus. Pertinent negatives for hypoglycemia include no confusion. Pertinent negatives for diabetes include no chest pain.     Past Medical History:  Diagnosis Date   Diabetes mellitus without complication ([place])    Obesity affecting pregnancy, antepartum 05/30/2017    Past Surgical History:  Procedure Laterality Date   CESAREAN SECTION  2010   failed VAVD   CESAREAN SECTION N/A 06/28/2017   Procedure: CESAREAN SECTION, BTL;  Surgeon: Malachy Mood, MD;  Location: [facility] ORS;  Service: Obstetrics;  Laterality: N/A;   FOOT SURGERY     nexplanon removal  07/2016   TUBAL LIGATION      Family History  Problem Relation Age of Onset   Diabetes Father    Heart failure Other     Social History   Socioeconomic History   Marital status: Married    Spouse name: Not on file   Number of children: Not on file   Years of education: Not on file   Highest education level: Not on file  Occupational History   Not on file  Tobacco Use   Smoking status: Never   Smokeless tobacco: Never  Vaping Use   Vaping Use: Never used  Substance and Sexual Activity   Alcohol use: No   Drug use: No   Sexual activity: Yes    Birth control/protection: Surgical  Other Topics Concern   Not on file  Social History Narrative   Not on file   Social Determinants of Health   Financial Resource Strain: Not on file  Food Insecurity: Not on file  Transportation Needs: Not on file  Physical Activity: Not on  file  Stress: Not on file  Social Connections: Not on file  Intimate Partner Violence: Not on file     Outpatient Medications Prior to Visit  Medication Sig Dispense Refill   fenofibrate (TRICOR) 145 MG tablet Take 1 tablet (145 mg total) by mouth daily. 30 tablet 2   metFORMIN (GLUCOPHAGE) 500 MG tablet Take 1 tablet (500 mg total) by mouth 2 (two) times daily with a meal. 180 tablet 3   Semaglutide,0.25 or 0.5MG/DOS, 2 MG/3ML SOPN Inject 0.25 mg into the skin as directed once a week. 3 mL 3   Semaglutide, 1 MG/DOSE, 4 MG/3ML SOPN Inject 0.25 mg as directed once a week. 3 mL 3   sitaGLIPtin (JANUVIA) 50 MG tablet Take 1 tablet (50 mg total) by mouth daily. 30 tablet 1   No facility-administered medications prior to visit.    Allergies  Allergen Reactions   Fish Allergy Anaphylaxis   Peanut-Containing Drug Products Anaphylaxis   Shellfish Allergy Anaphylaxis   Sulfa Antibiotics Nausea And Vomiting    ROS Review of Systems  Constitutional: Negative.   HENT: Negative.    Eyes: Negative.   Respiratory:  Negative for apnea and shortness of breath.   Cardiovascular:  Negative for chest pain and palpitations.  Gastrointestinal: Negative.   Genitourinary: Negative.  Musculoskeletal: Negative.   Neurological: Negative.   Psychiatric/Behavioral:  Negative for agitation, behavioral problems and confusion.       Objective:    Physical Exam Constitutional:      Appearance: Normal appearance. She is obese.  HENT:     Head: Normocephalic.     Right Ear: Tympanic membrane normal.     Left Ear: Tympanic membrane normal.     Nose: Nose normal.     Mouth/Throat:     Mouth: Mucous membranes are moist.     Pharynx: Oropharynx is clear.  Eyes:     Extraocular Movements: Extraocular movements intact.     Conjunctiva/sclera: Conjunctivae normal.     Pupils: Pupils are equal, round, and reactive to light.  Cardiovascular:     Rate and Rhythm: Normal rate and regular rhythm.      Pulses: Normal pulses.     Heart sounds: Normal heart sounds.  Pulmonary:     Effort: Pulmonary effort is normal. No respiratory distress.     Breath sounds: Normal breath sounds. No rhonchi.  Abdominal:     General: Bowel sounds are normal.     Palpations: Abdomen is soft. There is no mass.     Tenderness: There is no abdominal tenderness.     Hernia: No hernia is present.  Musculoskeletal:        General: Normal range of motion.     Cervical back: Neck supple. No tenderness.  Skin:    General: Skin is warm.     Capillary Refill: Capillary refill takes less than 2 seconds.  Neurological:     General: No focal deficit present.     Mental Status: She is alert and oriented to person, place, and time. Mental status is at baseline.  Psychiatric:        Mood and Affect: Mood normal.        Behavior: Behavior normal.        Thought Content: Thought content normal.        Judgment: Judgment normal.     There were no vitals taken for this visit. Wt Readings from Last 3 Encounters:  10/08/21 197 lb 14.4 oz (89.8 kg)  04/28/21 202 lb (91.6 kg)  05/05/20 204 lb (92.5 kg)     Health Maintenance  Topic Date Due   FOOT EXAM  Never done   OPHTHALMOLOGY EXAM  Never done   Hepatitis C Screening  Never done   Diabetic kidney evaluation - Urine ACR  04/25/2019   COVID-19 Vaccine (3 - Moderna series) 10/01/2019   INFLUENZA VACCINE  08/25/2021   HEMOGLOBIN A1C  04/08/2022   Diabetic kidney evaluation - GFR measurement  10/14/2022   PAP SMEAR-Modifier  04/28/2024   TETANUS/TDAP  04/21/2027   HIV Screening  Completed   HPV VACCINES  Aged Out    There are no preventive care reminders to display for this patient.  Lab Results  Component Value Date   TSH 1.84 10/13/2021   Lab Results  Component Value Date   WBC 8.1 10/13/2021   HGB 13.6 10/13/2021   HCT 38.5 10/13/2021   MCV 91.4 10/13/2021   PLT 216 10/13/2021   Lab Results  Component Value Date   NA 133 (L) 10/13/2021   K  4.0 10/13/2021   CO2 20 10/13/2021   GLUCOSE 339 (H) 10/13/2021   BUN 10 10/13/2021   CREATININE 0.64 10/13/2021   BILITOT 0.4 10/13/2021   ALKPHOS 44 08/07/2019   AST 19 10/13/2021  ALT 17 10/13/2021   PROT 7.0 10/13/2021   ALBUMIN 4.1 08/07/2019   CALCIUM 9.4 10/13/2021   ANIONGAP 7 08/07/2019   EGFR 120 10/13/2021   Lab Results  Component Value Date   CHOL 114 10/13/2021   Lab Results  Component Value Date   HDL 15 (L) 10/13/2021   Lab Results  Component Value Date   [facility]  10/13/2021     Comment:     . LDL cholesterol not calculated. Triglyceride levels greater than 400 mg/dL invalidate calculated LDL results. . Reference range: <100 . Desirable range <100 mg/dL for primary prevention;   <70 mg/dL for patients with CHD or diabetic patients  with > or = 2 CHD risk factors. Marland Kitchen LDL-C is now calculated using the Martin-Hopkins  calculation, which is a validated novel method providing  better accuracy than the Friedewald equation in the  estimation of LDL-C.  Cresenciano Genre et al. Annamaria Helling. 5158;265(87): 2061-2068  (http://education.[employer].com/faq/FAQ164)    Lab Results  Component Value Date   TRIG 1,183 (H) 10/13/2021   Lab Results  Component Value Date   CHOLHDL 7.6 (H) 10/13/2021   Lab Results  Component Value Date   HGBA1C 10.4 10/08/2021      Assessment & Plan:   Problem List Items Addressed This Visit       Endocrine   Diabetes mellitus, type 2 ([place]) - Primary    Her blood sugar at office is 416 the office today. Started patient on Ozempic once a week injection. Continue metformin. If needed will change to medication regimen during the next appointment.       Relevant Orders   POCT glucose (manual entry) (Completed)     No orders of the defined types were placed in this encounter.    Follow-up: No follow-ups on file.    Theresia Lo, NP

## 2021-10-30 ENCOUNTER — Other Ambulatory Visit: Payer: Self-pay

## 2021-10-30 ENCOUNTER — Telehealth: Payer: 59 | Admitting: Physician Assistant

## 2021-10-30 DIAGNOSIS — M5441 Lumbago with sciatica, right side: Secondary | ICD-10-CM

## 2021-10-30 MED ORDER — CYCLOBENZAPRINE HCL 10 MG PO TABS
5.0000 mg | ORAL_TABLET | Freq: Three times a day (TID) | ORAL | 0 refills | Status: DC | PRN
  Filled 2021-10-30: qty 30, 10d supply, fill #0

## 2021-10-30 MED ORDER — NAPROXEN 500 MG PO TABS
500.0000 mg | ORAL_TABLET | Freq: Two times a day (BID) | ORAL | 0 refills | Status: DC
  Filled 2021-10-30: qty 30, 15d supply, fill #0

## 2021-10-30 NOTE — Progress Notes (Signed)

## 2021-11-05 ENCOUNTER — Ambulatory Visit: Payer: 59 | Admitting: Nurse Practitioner

## 2021-11-14 ENCOUNTER — Emergency Department (HOSPITAL_COMMUNITY)
Admission: EM | Admit: 2021-11-14 | Discharge: 2021-11-14 | Disposition: A | Discharge status: Home / Self Care | Payer: 59 | Attending: Emergency Medicine | Admitting: Emergency Medicine

## 2021-11-14 ENCOUNTER — Encounter (HOSPITAL_COMMUNITY): Payer: Self-pay

## 2021-11-14 ENCOUNTER — Other Ambulatory Visit: Payer: Self-pay

## 2021-11-14 ENCOUNTER — Emergency Department (HOSPITAL_COMMUNITY): Payer: 59

## 2021-11-14 DIAGNOSIS — E119 Type 2 diabetes mellitus without complications: Secondary | ICD-10-CM | POA: Diagnosis not present

## 2021-11-14 DIAGNOSIS — I10 Essential (primary) hypertension: Secondary | ICD-10-CM | POA: Diagnosis not present

## 2021-11-14 DIAGNOSIS — R0789 Other chest pain: Secondary | ICD-10-CM

## 2021-11-14 DIAGNOSIS — Z7984 Long term (current) use of oral hypoglycemic drugs: Secondary | ICD-10-CM | POA: Diagnosis not present

## 2021-11-14 DIAGNOSIS — R079 Chest pain, unspecified: Secondary | ICD-10-CM | POA: Diagnosis not present

## 2021-11-14 DIAGNOSIS — Z91010 Allergy to peanuts: Secondary | ICD-10-CM | POA: Insufficient documentation

## 2021-11-14 DIAGNOSIS — K760 Fatty (change of) liver, not elsewhere classified: Secondary | ICD-10-CM | POA: Diagnosis not present

## 2021-11-14 LAB — BASIC METABOLIC PANEL
Anion gap: 7 (ref 5–15)
BUN: 9 mg/dL (ref 6–20)
CO2: 25 mmol/L (ref 22–32)
Calcium: 8.9 mg/dL (ref 8.9–10.3)
Chloride: 105 mmol/L (ref 98–111)
Creatinine, Ser: 0.65 mg/dL (ref 0.44–1.00)
GFR, Estimated: >60 mL/min (ref >60)
Glucose, Bld: 261 mg/dL — ABNORMAL HIGH (ref 70–99)
Potassium: 3.3 mmol/L — ABNORMAL LOW (ref 3.5–5.1)
Sodium: 137 mmol/L (ref 135–145)

## 2021-11-14 LAB — CBC
HCT: 40.9 % (ref 36.0–46.0)
Hemoglobin: 13.9 g/dL (ref 12.0–15.0)
MCH: 30.9 pg (ref 26.0–34.0)
MCHC: 34 g/dL (ref 30.0–36.0)
MCV: 90.9 fL (ref 80.0–100.0)
Platelets: 233 10*3/uL (ref 150–400)
RBC: 4.5 MIL/uL (ref 3.87–5.11)
RDW: 13.2 % (ref 11.5–15.5)
WBC: 9.7 10*3/uL (ref 4.0–10.5)
nRBC: 0 % (ref 0.0–0.2)

## 2021-11-14 LAB — D-DIMER, QUANTITATIVE: D-Dimer, Quant: 0.51 ug/mL-FEU — ABNORMAL HIGH (ref 0.00–0.50)

## 2021-11-14 LAB — TROPONIN I (HIGH SENSITIVITY)
Troponin I (High Sensitivity): <2 ng/L (ref <18)
Troponin I (High Sensitivity): <2 ng/L (ref <18)

## 2021-11-14 LAB — TSH: TSH: 0.972 u[IU]/mL (ref 0.350–4.500)

## 2021-11-14 MED ORDER — DICLOFENAC SODIUM 75 MG PO TBEC: 75.0000 mg | DELAYED_RELEASE_TABLET | Freq: Two times a day (BID) | ORAL | 0 refills | Status: DC

## 2021-11-14 MED ORDER — IOHEXOL 350 MG/ML SOLN
100.0000 mL | Freq: Once | INTRAVENOUS | Status: AC | PRN
  Administered 2021-11-14: 100 mL via INTRAVENOUS

## 2021-11-14 MED ORDER — ACETAMINOPHEN 500 MG PO TABS: 1000.0000 mg | ORAL_TABLET | Freq: Once | ORAL | Status: DC

## 2021-11-14 MED ORDER — ALUM & MAG HYDROXIDE-SIMETH 200-200-20 MG/5ML PO SUSP
30.0000 mL | Freq: Once | ORAL | Status: AC
  Administered 2021-11-14: 30 mL via ORAL
  Filled 2021-11-14: qty 30

## 2021-11-14 NOTE — ED Provider Notes (Signed)
[facility] EMERGENCY DEPARTMENT Provider Note   CSN: [id number] Arrival date & time: 11/14/21  1502     History  Chief Complaint  Patient presents with   Chest Pain    PEARLEE ARVIZU is a 31 y.o. female.  The history is provided by the patient and medical records. No language interpreter was used.  Chest Pain    31 year old female with significant history of diabetes, hypertension, obesity, presenting complaining of chest pain.  Patient report for the past several weeks she has been experiencing intermittent pain in her chest.  She described pain as a sharp stabbing sensation to her epigastric region and midsternal region that radiates towards her shoulder blade.  Pain usually lasting for several minutes and she would have to catch her breath during these episodes.  Pain is waxing waning without any provocation.  No specific treatment tried.  She does not complain of any fever chills no productive cough hemoptysis nausea vomiting lightheadedness dizziness or diaphoresis.  Eating does not brought on the pain.  No prior issue of PE or DVT no recent surgery prolonged bedrest active cancer hemoptysis no calf swelling lower extremity swelling.  No history of thyroid disease.  Currently rates the pain as 8 out of 10.  Home Medications Prior to Admission medications   Medication Sig Start Date End Date Taking? Authorizing Provider  cyclobenzaprine (FLEXERIL) 10 MG tablet Take 0.5-1 tablets (5-10 mg total) by mouth 3 (three) times daily as needed. 10/30/21   Margaretann Loveless, PA-C  fenofibrate (TRICOR) 145 MG tablet Take 1 tablet (145 mg total) by mouth daily. 10/17/21   Kara Dies, NP  metFORMIN (GLUCOPHAGE) 500 MG tablet Take 1 tablet (500 mg total) by mouth 2 (two) times daily with a meal. 10/08/21   Kara Dies, NP  naproxen (NAPROSYN) 500 MG tablet Take 1 tablet (500 mg total) by mouth 2 (two) times daily with a meal. 10/30/21   Burnette, Alessandra Bevels, PA-C  Semaglutide,0.25  or 0.5MG /DOS, 2 MG/3ML SOPN Inject 0.25 mg into the skin as directed once a week. 10/22/21   Kara Dies, NP      Allergies    Fish allergy, Peanut-containing drug products, Shellfish allergy, and Sulfa antibiotics    Review of Systems   Review of Systems  Cardiovascular:  Positive for chest pain.  All other systems reviewed and are negative.   Physical Exam Updated Vital Signs BP 127/85 (BP Location: Right Arm)   Pulse (!) 108   Temp 98.7 F (37.1 C) (Oral)   Resp 18   Ht 5\' 5"  (1.651 m)   Wt 84.8 kg   SpO2 99%   BMI 31.12 kg/m  Physical Exam Vitals and nursing note reviewed.  Constitutional:      General: She is not in acute distress.    Appearance: She is well-developed.  HENT:     Head: Atraumatic.  Eyes:     Conjunctiva/sclera: Conjunctivae normal.  Cardiovascular:     Rate and Rhythm: Tachycardia present.     Pulses: Normal pulses.     Heart sounds: Normal heart sounds.  Pulmonary:     Effort: Pulmonary effort is normal.  Abdominal:     Palpations: Abdomen is soft.     Tenderness: There is no abdominal tenderness.  Musculoskeletal:     Cervical back: Neck supple.     Right lower leg: No edema.     Left lower leg: No edema.  Skin:    Findings: No rash.  Neurological:  Mental Status: She is alert.  Psychiatric:        Mood and Affect: Mood normal.     ED Results / Procedures / Treatments   Labs (all labs ordered are listed, but only abnormal results are displayed) Labs Reviewed  BASIC METABOLIC PANEL - Abnormal; Notable for the following components:      Result Value   Potassium 3.3 (*)    Glucose, Bld 261 (*)    All other components within normal limits  D-DIMER, QUANTITATIVE - Abnormal; Notable for the following components:   D-Dimer, Quant 0.51 (*)    All other components within normal limits  CBC  TSH  TROPONIN I (HIGH SENSITIVITY)  TROPONIN I (HIGH SENSITIVITY)    EKG None ED ECG REPORT   Date: 11/14/2021  Rate: 108   Rhythm: sinus tachycardia  QRS Axis: normal  Intervals: normal  ST/T Wave abnormalities: nonspecific T wave changes  Conduction Disutrbances:none  Narrative Interpretation:   Old EKG Reviewed: unchanged  I have personally reviewed the EKG tracing and agree with the computerized printout as noted.   Radiology DG Chest 2 View  Result Date: 11/14/2021 CLINICAL DATA:  Chest and back pain for 2-3 weeks. EXAM: CHEST - 2 VIEW COMPARISON:  Chest x-ray dated 05/05/2020 FINDINGS: Heart size and mediastinal contours are within normal limits. Lungs are clear. No pleural effusion or pneumothorax is seen. Osseous structures about the chest are unremarkable. IMPRESSION: No active cardiopulmonary disease. No evidence of pneumonia or pulmonary edema. Electronically Signed   By: Franki Cabot M.D.   On: 11/14/2021 15:37    Procedures Procedures    Medications Ordered in ED Medications  alum & mag hydroxide-simeth (MAALOX/MYLANTA) 200-200-20 MG/5ML suspension 30 mL (has no administration in time range)    ED Course/ Medical Decision Making/ A&P                           Medical Decision Making Amount and/or Complexity of Data Reviewed Labs: ordered. Radiology: ordered.  Risk OTC drugs.   BP 127/85 (BP Location: Right Arm)   Pulse (!) 108   Temp 98.7 F (37.1 C) (Oral)   Resp 18   Ht 5\' 5"  (1.651 m)   Wt 84.8 kg   SpO2 99%   BMI 31.12 kg/m   81:84 PM 31 year old female with significant history of diabetes, hypertension, obesity, presenting complaining of chest pain.  Patient report for the past several weeks she has been experiencing intermittent pain in her chest.  She described pain as a sharp stabbing sensation to her epigastric region and midsternal region that radiates towards her shoulder blade.  Pain usually lasting for several minutes and she would have to catch her breath during these episodes.  Pain is waxing waning without any provocation.  No specific treatment tried.  She  does not complain of any fever chills no productive cough hemoptysis nausea vomiting lightheadedness dizziness or diaphoresis.  Eating does not brought on the pain.  No prior issue of PE or DVT no recent surgery prolonged bedrest active cancer hemoptysis no calf swelling lower extremity swelling.  No history of thyroid disease.  Currently rates the pain as 8 out of 10.  On exam this is an obese female resting comfortably appears to be in no acute discomfort.  She is tachycardic however no murmurs or gallops heard.  Lungs are clear on auscultation.  Abdomen is soft nontender.  Vital signs remarkable for elevated heart rate of  108.  No hypoxia, she is afebrile, she does not appear to be dehydrated.  No evidence of thyromegaly on exam.  Since I am unable to use PERC criteria to rule out PE, a D-dimer have been ordered.  I will also check TSH and along with basic labs and chest x-ray and EKG.  A GI cocktail given.  6:03 PM Labs, EKG, and imaging obtained independently reviewed and interpreted by me and I agree with radiologist interpretation.  Patient has made a mild elevated D-dimer of 0.51 therefore I will obtain chest CT angiogram for further assessment.  Patient has normal TSH.  Glucose mildly elevated at 261 without anion gap.  Normal WBC, normal H&H, normal troponin, chest x-ray unremarkable, EKG shows sinus tachycardia.  Pt sign out to oncoming team who will f/u on CTA result and determine disposition.         Final Clinical Impression(s) / ED Diagnoses Final diagnoses:  None    Rx / DC Orders ED Discharge Orders     None         Fayrene Helper, PA-C 11/14/21 1833    Lonell Grandchild, MD 11/15/21 1153

## 2021-11-14 NOTE — ED Provider Notes (Signed)
Patient's care assumed by me at 7 PM from [name] [facility].  Patient has a CT angio chest pending to rule out pulmonary embolus.  CT angio chest returned and shows no acute abnormality.  Patient is counseled on results.  Advised to follow-up with her primary care physician for recheck   Cheryl Carpenter 11/14/21 2020    Cristie Hem, MD 11/15/21 1153

## 2021-11-14 NOTE — ED Notes (Signed)
Patient transported to CT 

## 2021-11-14 NOTE — ED Triage Notes (Signed)
CP that started 2 weeks ago. Pain continuous. Pain in mid anterior torso. Pt reports radiation to her back. Shooting pain

## 2021-11-24 ENCOUNTER — Other Ambulatory Visit: Payer: Self-pay

## 2021-12-10 ENCOUNTER — Other Ambulatory Visit: Payer: Self-pay

## 2022-01-11 ENCOUNTER — Other Ambulatory Visit: Payer: Self-pay

## 2022-02-01 ENCOUNTER — Other Ambulatory Visit: Payer: Self-pay

## 2022-02-01 ENCOUNTER — Encounter: Payer: Self-pay | Admitting: Obstetrics and Gynecology

## 2022-02-01 ENCOUNTER — Ambulatory Visit (INDEPENDENT_AMBULATORY_CARE_PROVIDER_SITE_OTHER): Payer: 59 | Admitting: Obstetrics and Gynecology

## 2022-02-01 VITALS — BP 100/64 | Ht 65.0 in | Wt 204.0 lb

## 2022-02-01 DIAGNOSIS — N939 Abnormal uterine and vaginal bleeding, unspecified: Secondary | ICD-10-CM

## 2022-02-01 DIAGNOSIS — K439 Ventral hernia without obstruction or gangrene: Secondary | ICD-10-CM | POA: Insufficient documentation

## 2022-02-01 MED ORDER — NORETHINDRONE ACETATE 5 MG PO TABS
5.0000 mg | ORAL_TABLET | Freq: Every day | ORAL | 0 refills | Status: DC
  Filled 2022-02-01: qty 14, 14d supply, fill #0

## 2022-02-01 NOTE — Patient Instructions (Signed)
I value your feedback and you entrusting us with your care. If you get a [facility] patient survey, I would appreciate you taking the time to let us know about your experience today. Thank you! ? ? ?

## 2022-02-01 NOTE — Progress Notes (Signed)
Cheryl Dies, NP   Chief Complaint  Patient presents with   Vaginal Bleeding    Cycles are every month, this last cycle has been going on for two weeks, moderate cramping with heavy flow.    HPI:      Cheryl Carpenter is a 32 y.o. [id number] whose LMP was Patient's last menstrual period was 01/19/2022 (exact date)., presents today for AUB this cycle.  Menses are usually monthly, lasting 3-4 days, changing products BID (used to be heavy and 7 days) after endometrial ablation a few yrs ago, no BTB, no dysmen. Menses came 1 wk early this cycle and has persisted. Flow is heavy at times with "gushes", changing products hourly at heaviest times, with nickel to quarter sized clots. Also with dysmen, no meds taken. No increased stress/sickness/wt changes. Normal TSH 10/23 when went to ED for chest pain.  She is sexually active, s/p TL; no pain/bleeding.  Neg pap 04/28/21. Pt also notices abd hernia with valsalva maneuver.   Patient Active Problem List   Diagnosis Date Noted   Ventral hernia without obstruction or gangrene 02/01/2022   Hypertension 10/08/2021   Insomnia 04/28/2021   Class 1 obesity without serious comorbidity with body mass index (BMI) of 32.0 to 32.9 in adult 10/23/2019   Annual physical exam 10/09/2019   Menorrhagia with regular cycle 12/06/2018   Pre-existing diabetes mellitus in pregnancy 12/13/2016   Diabetes mellitus, type 2 ([facility]) 11/10/2016   ALLERGIC RHINITIS 03/21/2010    Past Surgical History:  Procedure Laterality Date   CESAREAN SECTION  2010   failed VAVD   CESAREAN SECTION N/A 06/28/2017   Procedure: CESAREAN SECTION, BTL;  Surgeon: Vena Austria, MD;  Location: [facility] ORS;  Service: Obstetrics;  Laterality: N/A;   FOOT SURGERY     nexplanon removal  07/2016   TUBAL LIGATION      Family History  Problem Relation Age of Onset   Diabetes Father     Social History   Socioeconomic History   Marital status: Married    Spouse name: Not on file    Number of children: Not on file   Years of education: Not on file   Highest education level: Not on file  Occupational History   Not on file  Tobacco Use   Smoking status: Never   Smokeless tobacco: Never  Vaping Use   Vaping Use: Never used  Substance and Sexual Activity   Alcohol use: No   Drug use: No   Sexual activity: Yes    Birth control/protection: Surgical    Comment: Tubal Ligation  Other Topics Concern   Not on file  Social History Narrative   Not on file   Social Determinants of Health   Financial Resource Strain: Not on file  Food Insecurity: Not on file  Transportation Needs: Not on file  Physical Activity: Not on file  Stress: Not on file  Social Connections: Not on file  Intimate Partner Violence: Not on file    Outpatient Medications Prior to Visit  Medication Sig Dispense Refill   Semaglutide,0.25 or 0.5MG /DOS, 2 MG/3ML SOPN Inject 0.25 mg into the skin as directed once a week. 3 mL 3   cyclobenzaprine (FLEXERIL) 10 MG tablet Take 0.5-1 tablets (5-10 mg total) by mouth 3 (three) times daily as needed. 30 tablet 0   diclofenac (VOLTAREN) 75 MG EC tablet Take 1 tablet (75 mg total) by mouth 2 (two) times daily. 20 tablet 0   fenofibrate (TRICOR) 145  MG tablet Take 1 tablet (145 mg total) by mouth daily. 30 tablet 2   metFORMIN (GLUCOPHAGE) 500 MG tablet Take 1 tablet (500 mg total) by mouth 2 (two) times daily with a meal. 180 tablet 3   naproxen (NAPROSYN) 500 MG tablet Take 1 tablet (500 mg total) by mouth 2 (two) times daily with a meal. 30 tablet 0   No facility-administered medications prior to visit.      ROS:  Review of Systems  Constitutional:  Negative for fever.  Gastrointestinal:  Negative for blood in stool, constipation, diarrhea, nausea and vomiting.  Genitourinary:  Positive for menstrual problem. Negative for dyspareunia, dysuria, flank pain, frequency, hematuria, urgency, vaginal bleeding, vaginal discharge and vaginal pain.   Musculoskeletal:  Negative for back pain.  Skin:  Negative for rash.   BREAST: No symptoms   OBJECTIVE:   Vitals:  BP 100/64   Ht 5\' 5"  (1.651 m)   Wt 204 lb (92.5 kg)   LMP 01/19/2022 (Exact Date)   BMI 33.95 kg/m   Physical Exam Vitals reviewed.  Constitutional:      Appearance: She is well-developed.  Pulmonary:     Effort: Pulmonary effort is normal.  Abdominal:     Palpations: Abdomen is soft.     Hernia: A hernia is present. Hernia is present in the ventral area.    Genitourinary:    General: Normal vulva.     Pubic Area: No rash.      Labia:        Right: No rash, tenderness or lesion.        Left: No rash, tenderness or lesion.      Vagina: Bleeding present. No vaginal discharge, erythema or tenderness.     Cervix: Normal.     Uterus: Normal. Not enlarged and not tender.      Adnexa: Right adnexa normal and left adnexa normal.       Right: No mass or tenderness.         Left: No mass or tenderness.       Comments: SMALL AMT BLEEDING ON EXAM Musculoskeletal:        General: Normal range of motion.     Cervical back: Normal range of motion.  Skin:    General: Skin is warm and dry.  Neurological:     General: No focal deficit present.     Mental Status: She is alert and oriented to person, place, and time.  Psychiatric:        Mood and Affect: Mood normal.        Behavior: Behavior normal.        Thought Content: Thought content normal.        Judgment: Judgment normal.    Assessment/Plan: Abnormal uterine bleeding (AUB) - Plan: norethindrone (AYGESTIN) 5 MG tablet; AUB for 1 cycle. Flow waxes and wanes. Try Rx aygestin for 2 wks. See if menses resumes to normal. If not, will do GYN u/s and further labs.   Ventral hernia without obstruction or gangrene - Plan: Ambulatory referral to General Surgery; reducible, refer to gen surg for eval/mgmt.    Meds ordered this encounter  Medications   norethindrone (AYGESTIN) 5 MG tablet    Sig: Take 1 tablet  (5 mg total) by mouth daily for 14 days.    Dispense:  14 tablet    Refill:  0    Order Specific Question:   Supervising Provider    Answer:   Rubie Maid [id number]  Return if symptoms worsen or fail to improve.  Khiry Pasquariello B. Asier Desroches, PA-C 02/01/2022 3:05 PM

## 2022-02-13 ENCOUNTER — Encounter: Payer: Self-pay | Admitting: Obstetrics and Gynecology

## 2022-02-15 ENCOUNTER — Ambulatory Visit (INDEPENDENT_AMBULATORY_CARE_PROVIDER_SITE_OTHER): Payer: 59 | Admitting: Surgery

## 2022-02-15 ENCOUNTER — Encounter: Payer: Self-pay | Admitting: Surgery

## 2022-02-15 ENCOUNTER — Other Ambulatory Visit: Payer: Self-pay

## 2022-02-15 VITALS — BP 116/87 | HR 91 | Temp 98.3°F | Ht 65.0 in | Wt 194.0 lb

## 2022-02-15 DIAGNOSIS — M6208 Separation of muscle (nontraumatic), other site: Secondary | ICD-10-CM | POA: Diagnosis not present

## 2022-02-15 HISTORY — DX: Separation of muscle (nontraumatic), other site: M62.08

## 2022-02-15 NOTE — Patient Instructions (Addendum)
Please call the office with any questions or concerns.  Diastasis Recti  Diastasis recti is a condition in which the muscles of the abdomen (rectus abdominis muscles) become thin and separate. The result is a wider space between the muscles of the right and left abdomen (abdominal muscles). This wider space between the muscles may cause a bulge in the middle of the abdomen. This bulge may be noticed when a person is straining or when he or she sits up after lying down. Diastasis recti can affect men and women. It is most common among pregnant women, babies, people with obesity, and people who have had abdominal surgery. Exercise or surgery may help correct this condition. What are the causes? Common causes of this condition include: Pregnancy. As the uterus grows in size, it puts pressure on the abdominal muscles, causing the muscles to separate. Obesity. Excess fat puts pressure on abdominal muscles. Weight lifting. Some exercises of the abdomen. Advanced age. Genetics. Having had surgery on the abdomen before. What increases the risk? This condition is more likely to develop in: Women. Newborns, especially newborns who are born early (prematurely). What are the signs or symptoms? Common symptoms of this condition include: A bulge in the middle of your abdomen. You will notice it most when you sit up or strain. Pain in your low back, hips, or the area between your hip bones (pelvis). Constipation. Being unable to control when you urinate (urinary incontinence). Bloating. Poor posture. How is this diagnosed? This condition is diagnosed with a physical exam. During the exam, your health care provider will ask you to lie flat on your back and do a crunch or half sit-up. If you have diastasis recti, a bulge will appear lengthwise between your abdominal muscles in the center of your abdomen. Your health care provider will measure the gap between your muscles with one of the following: A  medical device used to measure the space between two objects (caliper). A tape measure. CT scan. Ultrasound. Finger spaces. Your health care provider will measure the space using his or her fingers. How is this treated? If your muscle separation is not too large, you may not need treatment. However, if you are a woman who plans to become pregnant again, you should treat this condition before your next pregnancy. Treatment may include: Physical therapy exercises to strengthen and tighten your abdominal muscles. Lifestyle changes such as weight loss and exercise. Over-the-counter pain medicines as needed. Surgery to correct the separation. Follow these instructions at home: Activity Return to your normal activities as told by your health care provider. Ask your health care provider what activities are safe for you. Do exercises as told by your health care provider. Make sure you are doing your exercises and movements correctly when lifting weights or doing exercises using your abdominal muscles or the muscles in the center of your body that give stability (core muscles). Proper form can help to prevent this condition from happening again. General instructions If you are overweight, ask your health care provider for help with weight loss. Losing even a small amount of weight can help to improve your diastasis recti. Take over-the-counter or prescription medicines only as told by your health care provider. Do not strain. Straining can make the separation worse. Examples of straining include: Pushing hard to have a bowel movement, such as when you have constipation. Lifting heavy objects or lifting children. Standing up and sitting down. You may need to take these actions to prevent or treat constipation:  Drink enough fluid to keep your urine pale yellow. Take over-the-counter or prescription medicines. Eat foods that are high in fiber, such as beans, whole grains, and fresh fruits and  vegetables. Limit foods that are high in fat and processed sugars, such as fried or sweet foods. Keep all follow-up visits. This is important. Contact a health care provider if: You notice a new bulge in your abdomen. Get help right away if: You experience severe discomfort in your abdomen. You develop severe abdominal pain along with nausea, vomiting, or a fever. Summary Diastasis recti is a condition in which the muscles of the abdomen (rectus abdominismuscles) become thin and separate. You may notice a bulge in your abdomen because the space has widened between the muscles of the right and left abdomen. The most common symptom is a bulge in the middle of your abdomen. You will notice it most when you sit up or strain. This condition is diagnosed with a physical exam. If the muscle separation is not too big, you may not need treatment. Otherwise, you may need to do physical therapy or have surgery. This information is not intended to replace advice given to you by your health care provider. Make sure you discuss any questions you have with your health care provider. Document Revised: 09/14/2019 Document Reviewed: 09/14/2019 Elsevier Patient Education  [place].

## 2022-02-15 NOTE — Progress Notes (Signed)
02/15/2022  Reason for Visit:  Ventral hernia  Requesting Provider:  Ardeth Perfect, PA-C  History of Present Illness: Cheryl Carpenter is a 32 y.o. female presenting for evaluation of a possible ventral hernia.  The patient reports that she had noticed an area of bulging in her upper abdomen for about 7 months, but it had not been bothering her and had not really paid much attention to it.  She brought it up to her provider during her last OB/GYN check, and was referred to [place] for further evaluation.  She does think the area of bulging is getting larger with time.  She denies any abdominal pain, but does report sometimes discomfort when she eats something very greasy.  Otherwise denies any chest pain, shortness of breath, nausea, vomiting, constipation.  She does have a history of uncontrolled diabetes with a HgA1c of 10.4 in September 2023 and was started on Ozempic.  She reports that her glucose levels are better, in 130s range, but has not had a new HgA1c check done.  She has had two pregnancies and 2 c-sections.  Past Medical History: Past Medical History:  Diagnosis Date   Diabetes mellitus without complication ([place])    Obesity affecting pregnancy, antepartum 05/30/2017     Past Surgical History: Past Surgical History:  Procedure Laterality Date   CESAREAN SECTION  2010   failed VAVD   CESAREAN SECTION N/A 06/28/2017   Procedure: CESAREAN SECTION, BTL;  Surgeon: Malachy Mood, MD;  Location: [facility] ORS;  Service: Obstetrics;  Laterality: N/A;   FOOT SURGERY     nexplanon removal  07/2016   TUBAL LIGATION      Home Medications: Prior to Admission medications   Medication Sig Start Date End Date Taking? Authorizing Provider  Semaglutide,0.25 or 0.5MG /DOS, 2 MG/3ML SOPN Inject 0.25 mg into the skin as directed once a week. 10/22/21  Yes Theresia Lo, NP    Allergies: Allergies  Allergen Reactions   Fish Allergy Anaphylaxis   Peanut-Containing Drug Products Anaphylaxis    Shellfish Allergy Anaphylaxis   Sulfa Antibiotics Nausea And Vomiting    Social History:  reports that she has never smoked. She has never used smokeless tobacco. She reports that she does not drink alcohol and does not use drugs.   Family History: Family History  Problem Relation Age of Onset   Diabetes Father     Review of Systems: Review of Systems  Constitutional:  Negative for chills and fever.  Respiratory:  Negative for shortness of breath.   Cardiovascular:  Negative for chest pain.  Gastrointestinal:  Negative for abdominal pain, nausea and vomiting.  Genitourinary:  Negative for dysuria.    Physical Exam BP 116/87   Pulse 91   Temp 98.3 F (36.8 C) (Oral)   Ht 5\' 5"  (1.651 m)   Wt 194 lb (88 kg)   LMP 01/19/2022 (Exact Date)   SpO2 99%   BMI 32.28 kg/m  CONSTITUTIONAL: No acute distress, well nourished. HEENT:  Normocephalic, atraumatic, extraocular motion intact. RESPIRATORY:  Lungs are clear, and breath sounds are equal bilaterally. Normal respiratory effort without pathologic use of accessory muscles. CARDIOVASCULAR: Heart is regular without murmurs, gallops, or rubs. GI: The abdomen is soft, non-distended, non-tender to palpation.  The patient has diastasis recti of the upper abdomen, but no hernia defect involved.  MUSCULOSKELETAL:  Normal muscle strength and tone in all four extremities.  No peripheral edema or cyanosis. SKIN: Skin turgor is normal. There are no pathologic skin lesions.  NEUROLOGIC:  Motor and sensation is grossly normal.  Cranial nerves are grossly intact. PSYCH:  Alert and oriented to person, place and time. Affect is normal.  Laboratory Analysis: Labs from 11/14/21: Na 137, K 3.3, Cl 105, CO2 25, BUN 9, Cr 0.65.  WBC 9.7, Hgb 13.9, Hct 40.9, Plt 233.  Imaging: No abdominal imaging.  Assessment and Plan: This is a 32 y.o. female with diastasis recti.  --Discussed with the patient that she does not have a ventral hernia but she  does have diastasis recti and explained to the patient what this is and the risk factors for it.  No surgery is needed as there is no hernia, but the diastasis does increase the risk of possible hernia in the future.  Discussed with her that weight loss would be helpful to decrease the risk and controlling her diabetes better would help as well. --Return precautions given and discussed signs/symptoms of developing hernias. --Follow up as needed.  I spent 30 minutes dedicated to the care of this patient on the date of this encounter to include pre-visit review of records, face-to-face time with the patient discussing diagnosis and management, and any post-visit coordination of care.   Melvyn Neth, [facility]

## 2022-02-28 ENCOUNTER — Other Ambulatory Visit: Payer: Self-pay

## 2022-02-28 ENCOUNTER — Other Ambulatory Visit: Payer: Self-pay | Admitting: Obstetrics and Gynecology

## 2022-02-28 DIAGNOSIS — G4700 Insomnia, unspecified: Secondary | ICD-10-CM

## 2022-02-28 MED ORDER — ZOLPIDEM TARTRATE 5 MG PO TABS
5.0000 mg | ORAL_TABLET | Freq: Every evening | ORAL | 0 refills | Status: DC | PRN
  Filled 2022-02-28 – 2022-03-01 (×2): qty 30, 30d supply, fill #0

## 2022-02-28 NOTE — Progress Notes (Signed)
Rx RF ambien prn insomnia.

## 2022-03-01 ENCOUNTER — Other Ambulatory Visit: Payer: Self-pay

## 2022-03-03 NOTE — Progress Notes (Unsigned)
Established Patient Office Visit  Subjective:  Patient ID: Cheryl Carpenter, female    DOB: 1990/12/29  Age: 32 y.o. MRN: [id number]  CC: No chief complaint on file.    HPI  [place] presents to the clinic for her annual physical exam.   Dentist: Eye examination: Exercise:  Smoking:  Diet: Patient does/ does not eat meat. Patient consumes fruits and veggies. Patient eat some .Marland Kitchen... fried food. Patient drinks water, ... coffee  Health Maintenance  Topic Date Due   FOOT EXAM  Never done   OPHTHALMOLOGY EXAM  Never done   Diabetic kidney evaluation - Urine ACR  Never done   Hepatitis C Screening  Never done   INFLUENZA VACCINE  08/25/2021   COVID-19 Vaccine (3 - 2023-24 season) 09/25/2021   HEMOGLOBIN A1C  04/08/2022   Diabetic kidney evaluation - eGFR measurement  11/15/2022   PAP SMEAR-Modifier  04/28/2024   DTaP/Tdap/Td (2 - Td or Tdap) 04/21/2027   HIV Screening  Completed   HPV VACCINES  Aged Out     HPI   Past Medical History:  Diagnosis Date   Diabetes mellitus without complication ([place])    Obesity affecting pregnancy, antepartum 05/30/2017    Past Surgical History:  Procedure Laterality Date   CESAREAN SECTION  2010   failed VAVD   CESAREAN SECTION N/A 06/28/2017   Procedure: CESAREAN SECTION, BTL;  Surgeon: Malachy Mood, MD;  Location: [facility] ORS;  Service: Obstetrics;  Laterality: N/A;   FOOT SURGERY     nexplanon removal  07/2016   TUBAL LIGATION      Family History  Problem Relation Age of Onset   Diabetes Father     Social History   Socioeconomic History   Marital status: Married    Spouse name: Not on file   Number of children: Not on file   Years of education: Not on file   Highest education level: Not on file  Occupational History   Not on file  Tobacco Use   Smoking status: Never   Smokeless tobacco: Never  Vaping Use   Vaping Use: Never used  Substance and Sexual Activity   Alcohol use: No   Drug use: No   Sexual  activity: Yes    Birth control/protection: Surgical    Comment: Tubal Ligation  Other Topics Concern   Not on file  Social History Narrative   Not on file   Social Determinants of Health   Financial Resource Strain: Not on file  Food Insecurity: Not on file  Transportation Needs: Not on file  Physical Activity: Not on file  Stress: Not on file  Social Connections: Not on file  Intimate Partner Violence: Not on file     Outpatient Medications Prior to Visit  Medication Sig Dispense Refill   Semaglutide,0.25 or 0.5MG /DOS, 2 MG/3ML SOPN Inject 0.25 mg into the skin as directed once a week. 3 mL 3   zolpidem (AMBIEN) 5 MG tablet Take 1 tablet (5 mg total) by mouth at bedtime as needed for sleep. 30 tablet 0   No facility-administered medications prior to visit.    Allergies  Allergen Reactions   Fish Allergy Anaphylaxis   Peanut-Containing Drug Products Anaphylaxis   Shellfish Allergy Anaphylaxis   Sulfa Antibiotics Nausea And Vomiting    ROS Review of Systems    Objective:    Physical Exam  There were no vitals taken for this visit. Wt Readings from Last 3 Encounters:  02/15/22 194 lb (88  kg)  02/01/22 204 lb (92.5 kg)  11/14/21 187 lb (84.8 kg)       There are no preventive care reminders to display for this patient.  Lab Results  Component Value Date   TSH 0.972 11/14/2021   Lab Results  Component Value Date   WBC 9.7 11/14/2021   HGB 13.9 11/14/2021   HCT 40.9 11/14/2021   MCV 90.9 11/14/2021   PLT 233 11/14/2021   Lab Results  Component Value Date   NA 137 11/14/2021   K 3.3 (L) 11/14/2021   CO2 25 11/14/2021   GLUCOSE 261 (H) 11/14/2021   BUN 9 11/14/2021   CREATININE 0.65 11/14/2021   BILITOT 0.4 10/13/2021   ALKPHOS 44 08/07/2019   AST 19 10/13/2021   ALT 17 10/13/2021   PROT 7.0 10/13/2021   ALBUMIN 4.1 08/07/2019   CALCIUM 8.9 11/14/2021   ANIONGAP 7 11/14/2021   EGFR 120 10/13/2021   Lab Results  Component Value Date    CHOL 114 10/13/2021   Lab Results  Component Value Date   HDL 15 (L) 10/13/2021   Lab Results  Component Value Date   [facility]  10/13/2021     Comment:     . LDL cholesterol not calculated. Triglyceride levels greater than 400 mg/dL invalidate calculated LDL results. . Reference range: <100 . Desirable range <100 mg/dL for primary prevention;   <70 mg/dL for patients with CHD or diabetic patients  with > or = 2 CHD risk factors. Marland Kitchen LDL-C is now calculated using the Martin-Hopkins  calculation, which is a validated novel method providing  better accuracy than the Friedewald equation in the  estimation of LDL-C.  Cresenciano Genre et al. Annamaria Helling. 8341;962(22): 2061-2068  (http://education.[employer].com/faq/FAQ164)    Lab Results  Component Value Date   TRIG 1,183 (H) 10/13/2021   Lab Results  Component Value Date   CHOLHDL 7.6 (H) 10/13/2021   Lab Results  Component Value Date   HGBA1C 10.4 10/08/2021      Assessment & Plan:   Problem List Items Addressed This Visit   None    No orders of the defined types were placed in this encounter.    Follow-up: No follow-ups on file.    Theresia Lo, NP

## 2022-03-04 ENCOUNTER — Encounter: Payer: 59 | Admitting: Nurse Practitioner

## 2022-03-25 ENCOUNTER — Other Ambulatory Visit: Payer: Self-pay | Admitting: Obstetrics and Gynecology

## 2022-03-25 DIAGNOSIS — G4700 Insomnia, unspecified: Secondary | ICD-10-CM

## 2022-03-26 ENCOUNTER — Other Ambulatory Visit: Payer: Self-pay

## 2022-03-26 ENCOUNTER — Other Ambulatory Visit: Payer: Self-pay | Admitting: Obstetrics and Gynecology

## 2022-03-26 DIAGNOSIS — G4700 Insomnia, unspecified: Secondary | ICD-10-CM

## 2022-03-28 ENCOUNTER — Other Ambulatory Visit: Payer: Self-pay | Admitting: Obstetrics and Gynecology

## 2022-03-28 DIAGNOSIS — G4700 Insomnia, unspecified: Secondary | ICD-10-CM

## 2022-03-29 ENCOUNTER — Other Ambulatory Visit: Payer: Self-pay

## 2022-03-29 MED ORDER — ZOLPIDEM TARTRATE 5 MG PO TABS
5.0000 mg | ORAL_TABLET | Freq: Every evening | ORAL | 0 refills | Status: DC | PRN
  Filled 2022-03-29: qty 30, 30d supply, fill #0

## 2022-05-01 ENCOUNTER — Telehealth: Payer: 59 | Admitting: Family

## 2022-05-01 DIAGNOSIS — R399 Unspecified symptoms and signs involving the genitourinary system: Secondary | ICD-10-CM

## 2022-05-02 MED ORDER — CEPHALEXIN 500 MG PO CAPS: 500.0000 mg | ORAL_CAPSULE | Freq: Two times a day (BID) | ORAL | 0 refills | Status: DC

## 2022-05-02 NOTE — Progress Notes (Signed)

## 2022-05-26 ENCOUNTER — Other Ambulatory Visit: Payer: Self-pay

## 2022-05-26 ENCOUNTER — Telehealth: Payer: 59 | Admitting: Physician Assistant

## 2022-05-26 DIAGNOSIS — B370 Candidal stomatitis: Secondary | ICD-10-CM

## 2022-05-26 MED ORDER — NYSTATIN 100000 UNIT/ML MT SUSP
5.0000 mL | Freq: Four times a day (QID) | OROMUCOSAL | 0 refills | Status: DC
  Filled 2022-05-26: qty 100, 5d supply, fill #0

## 2022-05-26 NOTE — Progress Notes (Signed)
E-Visit for Dental Pain  We are sorry that you are not feeling well.  Here is how we plan to help!  Based on what you have shared with me in the questionnaire, it sounds like you have thrush of the mouth, which you are at a higher risk for giving your history of type II diabetes. I have prescribed a nystatin mouthwash to use as directed. If not resolving, you need to  evaluated in person.    HOME CARE:   For toothaches: Over-the-counter pain medications such as acetaminophen or ibuprofen may be used. Take these as directed on the package while you arrange for a dental appointment. Avoid very cold or hot foods, because they may make the pain worse. You may get relief from biting on a cotton ball soaked in oil of cloves. You can get oil of cloves at most drug stores.  For jaw pain:  Aspirin may be helpful for problems in the joint of the jaw in adults. If pain happens every time you open your mouth widely, the temporomandibular joint (TMJ) may be the source of the pain. Yawning or taking a large bite of food may worsen the pain. An appointment with your doctor or dentist will help you find the cause.     GET HELP RIGHT AWAY IF:  You have a high fever or chills If you have had a recent head or face injury and develop headache, light headedness, nausea, vomiting, or other symptoms that concern you after an injury to your face or mouth, you could have a more serious injury in addition to your dental injury. A facial rash associated with a toothache: This condition may improve with medication. Contact your doctor for them to decide what is appropriate. Any jaw pain occurring with chest pain: Although jaw pain is most commonly caused by dental disease, it is sometimes referred pain from other areas. People with heart disease, especially people who have had stents placed, people with diabetes, or those who have had heart surgery may have jaw pain as a symptom of heart attack or angina. If your jaw  or tooth pain is associated with lightheadedness, sweating, or shortness of breath, you should see a doctor as soon as possible. Trouble swallowing or excessive pain or bleeding from gums: If you have a history of a weakened immune system, diabetes, or steroid use, you may be more susceptible to infections. Infections can often be more severe and extensive or caused by unusual organisms. Dental and gum infections in people with these conditions may require more aggressive treatment. An abscess may need draining or IV antibiotics, for example.  MAKE SURE YOU   Understand these instructions. Will watch your condition. Will get help right away if you are not doing well or get worse.  Thank you for choosing an e-visit.  Your e-visit answers were reviewed by a board certified advanced clinical practitioner to complete your personal care plan. Depending upon the condition, your plan could have included both over the counter or prescription medications.  Please review your pharmacy choice. Make sure the pharmacy is open so you can pick up prescription now. If there is a problem, you may contact your provider through [employer] and have the prescription routed to another pharmacy.  Your safety is important to [place]. If you have drug allergies check your prescription carefully.   For the next 24 hours you can use MyChart to ask questions about today's visit, request a non-urgent call back, or ask for a  work or school excuse. You will get an email in the next two days asking about your experience. I hope that your e-visit has been valuable and will speed your recovery.

## 2022-05-26 NOTE — Progress Notes (Signed)
I have spent 5 minutes in review of e-visit questionnaire, review and updating patient chart, medical decision making and response to patient.   Cheryl Khatoon Cody Mechel Schutter, PA-C    

## 2022-07-12 ENCOUNTER — Emergency Department: Payer: 59

## 2022-07-12 ENCOUNTER — Other Ambulatory Visit: Payer: Self-pay

## 2022-07-12 ENCOUNTER — Emergency Department
Admission: EM | Admit: 2022-07-12 | Discharge: 2022-07-12 | Disposition: A | Discharge status: Home / Self Care | Payer: 59 | Attending: Student in an Organized Health Care Education/Training Program | Admitting: Student in an Organized Health Care Education/Training Program

## 2022-07-12 DIAGNOSIS — R55 Syncope and collapse: Secondary | ICD-10-CM | POA: Insufficient documentation

## 2022-07-12 DIAGNOSIS — R4182 Altered mental status, unspecified: Secondary | ICD-10-CM | POA: Diagnosis not present

## 2022-07-12 LAB — BASIC METABOLIC PANEL
Anion gap: 8 (ref 5–15)
BUN: 12 mg/dL (ref 6–20)
CO2: 23 mmol/L (ref 22–32)
Calcium: 8 mg/dL — ABNORMAL LOW (ref 8.9–10.3)
Chloride: 101 mmol/L (ref 98–111)
Creatinine, Ser: 0.64 mg/dL (ref 0.44–1.00)
GFR, Estimated: >60 mL/min (ref >60)
Glucose, Bld: 323 mg/dL — ABNORMAL HIGH (ref 70–99)
Potassium: 3.8 mmol/L (ref 3.5–5.1)
Sodium: 132 mmol/L — ABNORMAL LOW (ref 135–145)

## 2022-07-12 LAB — CBC WITH DIFFERENTIAL/PLATELET
Abs Immature Granulocytes: 0.06 10*3/uL (ref 0.00–0.07)
Basophils Absolute: 0.1 10*3/uL (ref 0.0–0.1)
Basophils Relative: 1 %
Eosinophils Absolute: 0.1 10*3/uL (ref 0.0–0.5)
Eosinophils Relative: 2 %
HCT: 38.5 % (ref 36.0–46.0)
Hemoglobin: 13.4 g/dL (ref 12.0–15.0)
Immature Granulocytes: 1 %
Lymphocytes Relative: 27 %
Lymphs Abs: 2.4 10*3/uL (ref 0.7–4.0)
MCH: 31.2 pg (ref 26.0–34.0)
MCHC: 34.8 g/dL (ref 30.0–36.0)
MCV: 89.7 fL (ref 80.0–100.0)
Monocytes Absolute: 0.5 10*3/uL (ref 0.1–1.0)
Monocytes Relative: 6 %
Neutro Abs: 6 10*3/uL (ref 1.7–7.7)
Neutrophils Relative %: 63 %
Platelets: 273 10*3/uL (ref 150–400)
RBC: 4.29 MIL/uL (ref 3.87–5.11)
RDW: 13.8 % (ref 11.5–15.5)
WBC: 9.2 10*3/uL (ref 4.0–10.5)
nRBC: 0.2 % (ref 0.0–0.2)

## 2022-07-12 LAB — HCG, QUANTITATIVE, PREGNANCY: hCG, Beta Chain, Quant, S: <1 m[IU]/mL (ref <5)

## 2022-07-12 LAB — TROPONIN I (HIGH SENSITIVITY): Troponin I (High Sensitivity): 4 ng/L (ref <18)

## 2022-07-12 MED ORDER — GLIMEPIRIDE 2 MG PO TABS
2.0000 mg | ORAL_TABLET | Freq: Every day | ORAL | 2 refills | Status: DC
  Filled 2022-07-12: qty 90, 90d supply, fill #0
  Filled 2022-10-03 – 2022-10-18 (×2): qty 90, 90d supply, fill #1

## 2022-07-12 MED ORDER — SODIUM CHLORIDE 0.9 % IV BOLUS
1000.0000 mL | Freq: Once | INTRAVENOUS | Status: AC
  Administered 2022-07-12: 1000 mL via INTRAVENOUS

## 2022-07-12 MED ORDER — ACETAMINOPHEN 325 MG PO TABS
650.0000 mg | ORAL_TABLET | Freq: Once | ORAL | Status: AC
  Administered 2022-07-12: 650 mg via ORAL
  Filled 2022-07-12: qty 2

## 2022-07-12 MED ORDER — NITROGLYCERIN 0.4 MG SL SUBL
0.4000 mg | SUBLINGUAL_TABLET | SUBLINGUAL | 3 refills | Status: DC | PRN
  Filled 2022-07-12: qty 25, 25d supply, fill #0

## 2022-07-12 NOTE — ED Provider Notes (Signed)
[facility] Provider Note    Event Date/Time   First MD Initiated Contact with Patient 07/12/22 1718     (approximate)   History   unresponsive   HPI  Cheryl Carpenter is a 32 y.o. 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  She was seated.  Reported that she was feeling bad and had taken different medication possibly nitro and staff reports that she "fell out." There is no seizure-like activity.  She arrives to the ER with eyes closed spontaneous respirations no discoloration no sign of trauma.  Patient not providing any additional history.  Staff members do not report any history of seizures they state that she is a diabetic.  CBG 300.     Physical Exam   Triage Vital Signs: ED Triage Vitals [07/12/22 1714]  Enc Vitals Group     BP (!) 152/99     Pulse Rate (!) 105     Resp 20     Temp      Temp src      SpO2 100 %     Weight 180 lb (81.6 kg)     Height 5\' 4"  (1.626 m)     Head Circumference      Peak Flow      Pain Score      Pain Loc      Pain Edu?      Excl. in GC?     Most recent vital signs: Vitals:   07/12/22 1719 07/12/22 1821  BP:  128/84  Pulse:  89  Resp:  16  Temp: 98.3 F (36.8 C)   SpO2:  100%     Constitutional: Patient laying supine in bed with eyes closed.  She has good tone.  She is closing eyes against resistance. Eyes: Conjunctivae are normal.  Pupils normal and reactive. Head: Atraumatic. Nose: No congestion/rhinnorhea. Mouth/Throat: Mucous membranes are moist.   Neck: Painless ROM.  Cardiovascular:   Good peripheral circulation. Respiratory: Normal respiratory effort.  No retractions.  Gastrointestinal: Soft and nontender.  Musculoskeletal: no deformity. Neurologic: Will move thumb bilaterally and wiggle toes bilaterally.  Does react to painful stimuli bilaterally. No facial droop. No seizure  like activity Skin:  Skin is warm, dry and intact. No rash noted.     ED Results / Procedures / Treatments   Labs (all labs ordered are listed, but only abnormal results are displayed) Labs Reviewed  BASIC METABOLIC PANEL - Abnormal; Notable for the following components:      Result Value   Sodium 132 (*)    Glucose, Bld 323 (*)    Calcium 8.0 (*)    All other components within normal limits  CBC WITH DIFFERENTIAL/PLATELET  HCG, QUANTITATIVE, PREGNANCY  URINE DRUG SCREEN, QUALITATIVE ([facility] ONLY)  POC URINE PREG, ED  TROPONIN I (HIGH SENSITIVITY)     EKG  ED ECG REPORT I, Willy Eddy, the attending physician, personally viewed and interpreted this ECG.   Date: 07/12/2022  EKG Time: 17:11  Rate: 105  Rhythm: sinus  Axis: normal  Intervals: normal  ST&T Change: no stemi, no depressions    RADIOLOGY Please see ED Course for my review and interpretation.  I personally reviewed all radiographic images ordered to evaluate for the above acute complaints and reviewed radiology reports and findings.  These findings were personally discussed with the patient.  Please see  medical record for radiology report.    PROCEDURES:  Critical Care performed: No  Procedures   MEDICATIONS ORDERED IN ED: Medications  sodium chloride 0.9 % bolus 1,000 mL (0 mLs Intravenous Stopped 07/12/22 1844)  sodium chloride 0.9 % bolus 1,000 mL (0 mLs Intravenous Stopped 07/12/22 1950)  acetaminophen (TYLENOL) tablet 650 mg (650 mg Oral Given 07/12/22 2044)     IMPRESSION / MDM / ASSESSMENT AND PLAN / ED COURSE  I reviewed the triage vital signs and the nursing notes.                              Differential diagnosis includes, but is not limited to, dysrhythmia, hypoglycemia, DKA, anemia, ectopic, ACS, seizure, CVA, pseudoseizure, stress reaction  Patient presenting to the ER for evaluation of symptoms as described above.  Based on symptoms, risk factors and considered above  differential, this presenting complaint could reflect a potentially life-threatening illness therefore the patient will be placed on continuous pulse oximetry and telemetry for monitoring.  Laboratory evaluation will be sent to evaluate for the above complaints.  Her sugar is normal.  Is apparent clinically the patient has good tone and is purposely closing her eyes and not responding to staff.  Will check labs.   Clinical Course as of 07/12/22 2102  Mon Jul 12, 2022  1724 Nurse reapproach me with concern that the patient was having seizure.  I entered the room the patient was not demonstrating any shaking or seizure-like activity.  Upon raising her eyelids she pushed my hands away, was moving all extremities opening eyes voluntarily.  Very upset stating "I can hear everything all her saying I want you to get the fuck out of here."  she is pulling her EKG leads off and wanting to leave.  She was agreeable to stay for further observation and check blood work.  This does seem more consistent with stress reaction or pseudoseizure. [PR]  1827 Patient is reportedly acting completely at baseline per family.  Further history provided by staff does report that she was in a disciplinary meeting which makes this more likely to be stress reaction particular given stable vital signs no white count no anemia.  She is not pregnant. [PR]  1836 This x-ray my review and interpretation without evidence of consolidation or infiltrate.  CT head ordered for concern for reported headache.  Does not have any focal deficits.  CT head without acute abnormality.  Blood work reassuring.  She is appropriate for outpatient follow-up. [PR]    Clinical Course User Index [PR] Willy Eddy, MD     FINAL CLINICAL IMPRESSION(S) / ED DIAGNOSES   Final diagnoses:  Fainting spell     Rx / DC Orders   ED Discharge Orders     None        Note:  This document was prepared using Dragon voice recognition software and may  include unintentional dictation errors.    Willy Eddy, MD 07/12/22 2102

## 2022-07-12 NOTE — ED Notes (Signed)
Pt now awake and speaking and saying "I'm gonna fucking leave". Pt cursed at EDP and sent him out of the room". Coworkers still at bedside with pt. Pt appears confused.

## 2022-07-12 NOTE — ED Triage Notes (Signed)
Pt was at work meeting at [facility] [employer] and was sitting and suddenly slumped over in chair and became unresponsibe. This occurred about 10 minutes ago. Pt had told others at [facility] that she had taken 2 NTG today by accident and may have been given wrong medication by PCP.  Pt is breathing and has pulse. Pt followed commands to PCP. Eyes are closed. CBG over 300.

## 2022-07-12 NOTE — ED Notes (Signed)
Upon talking to patient, she appears to be back at baseline mentation other than mentioned headache. She states that she felt confused and unaware of what was happening initially but is coherent at this moment

## 2022-07-13 ENCOUNTER — Other Ambulatory Visit: Payer: Self-pay

## 2022-07-13 ENCOUNTER — Telehealth: Payer: 59 | Admitting: Family Medicine

## 2022-07-13 ENCOUNTER — Telehealth: Payer: Self-pay

## 2022-07-13 DIAGNOSIS — B3731 Acute candidiasis of vulva and vagina: Secondary | ICD-10-CM | POA: Diagnosis not present

## 2022-07-13 LAB — CBG MONITORING, ED: Glucose-Capillary: 339 mg/dL — ABNORMAL HIGH (ref 70–99)

## 2022-07-13 MED ORDER — FLUCONAZOLE 150 MG PO TABS
150.0000 mg | ORAL_TABLET | Freq: Every day | ORAL | 0 refills | Status: DC
Start: 2022-07-13 — End: 2022-09-07
  Filled 2022-07-13: qty 1, 1d supply, fill #0

## 2022-07-13 NOTE — Transitions of Care (Post Inpatient/ED Visit) (Unsigned)
I spoke with pt and she said was in meeting and had h/a and then passed out; pt does not remember a lot after that. Pt had seen Cheryl Carpenter earlier and pt was prescribed ntg instead of nystatin per pt.  pt request to see Cheryl Carpenter;' who pt has seen previously at [facility] office. I called [phone] but recording said office closed; I gave pt contact info for [facility] [phone] and she will call [facility] on 07/14/22 to schedule appt. UC & ED precautions given and pt voiced understanding; sending note to Cheryl Carpenter.     07/13/2022  Name: Cheryl Carpenter MRN: [id number] DOB: 13-Aug-1990  Today's TOC FU Call Status: Today's TOC FU Call Status:: Successful TOC FU Call Competed TOC FU Call Complete Date: 07/13/22  Transition Care Management Follow-up Telephone Call Date of Discharge: 07/12/22 Discharge Facility: [facility]) Type of Discharge: Emergency Department Reason for ED Visit: Other: (pt was in a meeting and had H/A and passed out and does not remember alot after that. pt said had seen Cheryl Carpenter earlier on 07/12/22 and was given Ntg instead of Nystatin.) How have you been since you were released from the hospital?: Better Any questions or concerns?: No  Items Reviewed: Did you receive and understand the discharge instructions provided?: Yes Medications obtained,verified, and reconciled?: Yes (Medications Reviewed) (pt understands her meds; not taking ozempic now.) Any new allergies since your discharge?: No Dietary orders reviewed?: NA Do you have support at home?: Yes People in Home: parent(s) Name of Support/Comfort Primary Source: Cheryl Carpenter  Medications Reviewed Today: Medications Reviewed Today     Reviewed by Freddy Finner, Carpenter (Nurse Practitioner) on 07/13/22 at 1111  Med List Status: <None>   Medication Order Taking? Sig Documenting Provider Last Dose Status Informant  cephALEXin (KEFLEX) 500 MG capsule [id number]  Take 1 capsule (500 mg  total) by mouth 2 (two) times daily. Cheryl Carpenter A, FNP  Active   glimepiride (AMARYL) 2 MG tablet [id number]  Take 1 tablet (2 mg total) by mouth daily.   Active   nitroGLYCERIN (NITROSTAT) 0.4 MG SL tablet [id number]  Place 1 tablet under tongue (let dissolve) every 5 minutes as needed.   Active   nystatin (MYCOSTATIN) 100000 UNIT/ML suspension [id number]  Take 5 mLs (500,000 Units total) by mouth 4 (four) times daily. Cheryl Merl, PA-C  Active   Semaglutide,0.25 or 0.5MG /DOS, 2 MG/3ML Cheryl Carpenter [id number] No Inject 0.25 mg into the skin as directed once a week. Kara Dies, Carpenter Taking Active   zolpidem (AMBIEN) 5 MG tablet [id number]  Take 1 tablet (5 mg total) by mouth at bedtime as needed for sleep. Copland, Cheryl Sorrel, PA-C  Active             Home Care and Equipment/Supplies: Were Home Health Services Ordered?: NA Any new equipment or medical supplies ordered?: NA  Functional Questionnaire: Do you need assistance with bathing/showering or dressing?: No Do you need assistance with meal preparation?: No Do you need assistance with eating?: No Do you have difficulty maintaining continence: No Do you need assistance with getting out of bed/getting out of a chair/moving?: No Do you have difficulty managing or taking your medications?: No  Follow up appointments reviewed: PCP Follow-up appointment confirmed?: No (pt request to see Cheryl Carpenter;' who pt has seen previouslyat [facility] office. I called [phone] but recording said office closed; I gave pt contact info for [facility] [phone] and  she will call [facility] on 07/14/22 to schedule appt.) MD Provider Line Number:[phone] Given: Yes Specialist Hospital Follow-up appointment confirmed?: NA Do you need transportation to your follow-up appointment?: No Do you understand care options if your condition(s) worsen?: Yes-patient verbalized understanding    SIGNATURE Cheryl Rife, LPN

## 2022-07-13 NOTE — Progress Notes (Signed)

## 2022-07-14 ENCOUNTER — Other Ambulatory Visit (HOSPITAL_COMMUNITY): Payer: Self-pay

## 2022-07-14 ENCOUNTER — Other Ambulatory Visit: Payer: Self-pay

## 2022-07-14 MED ORDER — NYSTATIN 100000 UNIT/GM EX CREA
TOPICAL_CREAM | Freq: Every day | CUTANEOUS | 0 refills | Status: DC
  Filled 2022-07-14: qty 15, 15d supply, fill #0

## 2022-07-16 ENCOUNTER — Other Ambulatory Visit: Payer: Self-pay

## 2022-07-20 ENCOUNTER — Other Ambulatory Visit (HOSPITAL_COMMUNITY): Payer: Self-pay

## 2022-07-21 ENCOUNTER — Other Ambulatory Visit: Payer: Self-pay

## 2022-07-28 ENCOUNTER — Emergency Department
Admission: EM | Admit: 2022-07-28 | Discharge: 2022-07-28 | Disposition: A | Discharge status: Home / Self Care | Payer: 59 | Attending: Emergency Medicine | Admitting: Emergency Medicine

## 2022-07-28 ENCOUNTER — Other Ambulatory Visit: Payer: Self-pay

## 2022-07-28 DIAGNOSIS — R4689 Other symptoms and signs involving appearance and behavior: Secondary | ICD-10-CM | POA: Diagnosis not present

## 2022-07-28 DIAGNOSIS — Z630 Problems in relationship with spouse or partner: Secondary | ICD-10-CM

## 2022-07-28 DIAGNOSIS — R456 Violent behavior: Secondary | ICD-10-CM | POA: Diagnosis not present

## 2022-07-28 DIAGNOSIS — Z91010 Allergy to peanuts: Secondary | ICD-10-CM | POA: Insufficient documentation

## 2022-07-28 DIAGNOSIS — E119 Type 2 diabetes mellitus without complications: Secondary | ICD-10-CM | POA: Insufficient documentation

## 2022-07-28 DIAGNOSIS — F989 Unspecified behavioral and emotional disorders with onset usually occurring in childhood and adolescence: Secondary | ICD-10-CM | POA: Diagnosis not present

## 2022-07-28 LAB — COMPREHENSIVE METABOLIC PANEL
ALT: 24 U/L (ref 0–44)
AST: 26 U/L (ref 15–41)
Albumin: 4.1 g/dL (ref 3.5–5.0)
Alkaline Phosphatase: 57 U/L (ref 38–126)
Anion gap: 10 (ref 5–15)
BUN: 10 mg/dL (ref 6–20)
CO2: 22 mmol/L (ref 22–32)
Calcium: 9 mg/dL (ref 8.9–10.3)
Chloride: 101 mmol/L (ref 98–111)
Creatinine, Ser: 0.67 mg/dL (ref 0.44–1.00)
GFR, Estimated: >60 mL/min (ref >60)
Glucose, Bld: 278 mg/dL — ABNORMAL HIGH (ref 70–99)
Potassium: 3.7 mmol/L (ref 3.5–5.1)
Sodium: 133 mmol/L — ABNORMAL LOW (ref 135–145)
Total Bilirubin: 1.1 mg/dL (ref 0.3–1.2)
Total Protein: 8.2 g/dL — ABNORMAL HIGH (ref 6.5–8.1)

## 2022-07-28 LAB — URINE DRUG SCREEN, QUALITATIVE (ARMC ONLY)
Amphetamines, Ur Screen: NOT DETECTED
Barbiturates, Ur Screen: NOT DETECTED
Benzodiazepine, Ur Scrn: NOT DETECTED
Cannabinoid 50 Ng, Ur ~~LOC~~: NOT DETECTED
Cocaine Metabolite,Ur ~~LOC~~: NOT DETECTED
MDMA (Ecstasy)Ur Screen: NOT DETECTED
Methadone Scn, Ur: NOT DETECTED
Opiate, Ur Screen: NOT DETECTED
Phencyclidine (PCP) Ur S: NOT DETECTED
Tricyclic, Ur Screen: NOT DETECTED

## 2022-07-28 LAB — CBC
HCT: 41.8 % (ref 36.0–46.0)
Hemoglobin: 14.1 g/dL (ref 12.0–15.0)
MCH: 30.3 pg (ref 26.0–34.0)
MCHC: 33.7 g/dL (ref 30.0–36.0)
MCV: 89.9 fL (ref 80.0–100.0)
Platelets: 232 10*3/uL (ref 150–400)
RBC: 4.65 MIL/uL (ref 3.87–5.11)
RDW: 13.1 % (ref 11.5–15.5)
WBC: 8.9 10*3/uL (ref 4.0–10.5)
nRBC: 0 % (ref 0.0–0.2)

## 2022-07-28 LAB — ACETAMINOPHEN LEVEL: Acetaminophen (Tylenol), Serum: <10 ug/mL — ABNORMAL LOW (ref 10–30)

## 2022-07-28 LAB — ETHANOL: Alcohol, Ethyl (B): <10 mg/dL (ref <10)

## 2022-07-28 LAB — SALICYLATE LEVEL: Salicylate Lvl: <7 mg/dL — ABNORMAL LOW (ref 7.0–30.0)

## 2022-07-28 LAB — POC URINE PREG, ED: Preg Test, Ur: NEGATIVE
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
## 2022-07-28 NOTE — ED Notes (Signed)
IVC rescinded per NP Willeen Cass

## 2022-07-28 NOTE — ED Notes (Signed)
ivc/psych consult ordered/pending.. 

## 2022-07-28 NOTE — ED Notes (Signed)
Phone given.

## 2022-07-28 NOTE — Discharge Instructions (Signed)
You were seen in the emergency department today for evaluation of your behavior.  You have been cleared by our psychiatry team.  Please follow-up as an outpatient for further evaluation.  Return to the ER for new or worsening symptoms.

## 2022-07-28 NOTE — ED Notes (Signed)
Patient took a shower prior to discharge.

## 2022-07-28 NOTE — ED Notes (Signed)
Patient talking with NP and she is calm and cooperative.

## 2022-07-28 NOTE — ED Notes (Addendum)
Pt dressed out by this ed tech and EDT Noemy. Pt belongings include black and blue crocs, black pants, blue panties, purple shirt,gray bra, and black phone. No jewelry on pt.

## 2022-07-28 NOTE — ED Provider Notes (Signed)
Case discussed with psych NP Willeen Cass.  After obtaining collateral information, she did feel that the patient's IVC could be rescinded and patient was stable for discharge home.  IVC rescinded by psychiatry team.  I did go to evaluate the patient at bedside.  She is calm, appropriately answering questions.  She denies SI, HI, IVH.  She does think she can be safely discharged home.  Strict return precautions provided.   Trinna Post, MD 07/28/22 [phone]
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
## 2022-07-28 NOTE — ED Notes (Signed)
Pt. To BHU from ED ambulatory without difficulty, to room BHU 3. Pt. Is alert and oriented, warm and dry in no distress. Pt. Denies SI, HI, and AVH. Pt. Calm and cooperative. Pt. Made aware of security cameras and Q15 minute rounds. Pt. Encouraged to let Nursing staff know of any concerns or needs.   ENVIRONMENTAL ASSESSMENT Potentially harmful objects out of patient reach: Yes.   Personal belongings secured: Yes.   Patient dressed in hospital provided attire only: Yes.   Plastic bags out of patient reach: Yes.   Patient care equipment (cords, cables, call bells, lines, and drains) shortened, removed, or accounted for: Yes.   Equipment and supplies removed from bottom of stretcher: Yes.   Potentially toxic materials out of patient reach: Yes.   Sharps container removed or out of patient reach: Yes.     

## 2022-07-28 NOTE — ED Triage Notes (Signed)
Pt arrives with BPD with IVC papers. Per papers pt was threatening. Pt is cooperative, denies injury, pain. Pt denies SI and HI.

## 2022-07-28 NOTE — ED Notes (Signed)
Patient received lunch tray and beverage. 

## 2022-07-28 NOTE — ED Notes (Signed)
Patient is on the phone, she is calm and cooperative.

## 2022-09-06 NOTE — Progress Notes (Unsigned)
PCP:  Corky Downs, MD   No chief complaint on file.    HPI:      [name] is a 32 y.o. [id number] whose LMP was No LMP recorded. Patient has had an ablation., presents today for her annual examination.  Her menses are {norm/abn:715}, lasting {number: 22536} days.  Dysmenorrhea {dysmen:716}. She {does:18564} have intermenstrual bleeding.Menses are usually monthly, lasting 3-4 days, changing products BID (used to be heavy and 7 days) after endometrial ablation a few yrs ago, no BTB, no dysmen. Had AUB 1/24, treated with aygestin.   Sex activity: single partner, contraception - tubal ligation.  Last Pap: 04/28/21 Results were: no abnormalities /neg HPV DNA  Hx of STDs: {STD hx:14358}  There is no FH of breast cancer. There is no FH of ovarian cancer. The patient {does:18564} do self-breast exams.  Tobacco use: {tob:20664} Alcohol use: {Alcohol:11675} No drug use.  Exercise: {exercise:31265}  She {does:18564} get adequate calcium and Vitamin D in her diet. Takes ambein occas for isnomina.   Referred to gen surg for ventral hernia   Patient Active Problem List   Diagnosis Date Noted   Threatening behavior 07/28/2022   Marital conflict 07/28/2022   Diastasis recti 02/15/2022   Ventral hernia without obstruction or gangrene 02/01/2022   Hypertension 10/08/2021   Insomnia 04/28/2021   Class 1 obesity without serious comorbidity with body mass index (BMI) of 32.0 to 32.9 in adult 10/23/2019   Annual physical exam 10/09/2019   Menorrhagia with regular cycle 12/06/2018   Pre-existing diabetes mellitus in pregnancy 12/13/2016   Diabetes mellitus, type 2 (HCC) 11/10/2016   ALLERGIC RHINITIS 03/21/2010    Past Surgical History:  Procedure Laterality Date   CESAREAN SECTION  2010   failed VAVD   CESAREAN SECTION N/A 06/28/2017   Procedure: CESAREAN SECTION, BTL;  Surgeon: Vena Austria, MD;  Location: [facility] ORS;  Service: Obstetrics;  Laterality: N/A;   FOOT SURGERY      nexplanon removal  07/2016   TUBAL LIGATION      Family History  Problem Relation Age of Onset   Diabetes Father     Social History   Socioeconomic History   Marital status: Married    Spouse name: Not on file   Number of children: Not on file   Years of education: Not on file   Highest education level: Not on file  Occupational History   Not on file  Tobacco Use   Smoking status: Never   Smokeless tobacco: Never  Vaping Use   Vaping status: Never Used  Substance and Sexual Activity   Alcohol use: No   Drug use: No   Sexual activity: Yes    Birth control/protection: Surgical    Comment: Tubal Ligation  Other Topics Concern   Not on file  Social History Narrative   Not on file   Social Determinants of Health   Financial Resource Strain: Not on file  Food Insecurity: Not on file  Transportation Needs: Not on file  Physical Activity: Not on file  Stress: Not on file  Social Connections: Not on file  Intimate Partner Violence: Not on file     Current Outpatient Medications:    cephALEXin (KEFLEX) 500 MG capsule, Take 1 capsule (500 mg total) by mouth 2 (two) times daily., Disp: 14 capsule, Rfl: 0   fluconazole (DIFLUCAN) 150 MG tablet, Take 1 tablet (150 mg total) by mouth daily. (Patient not taking: Reported on 07/28/2022), Disp: 1 tablet, Rfl: 0  glimepiride (AMARYL) 2 MG tablet, Take 1 tablet (2 mg total) by mouth daily., Disp: 90 tablet, Rfl: 2   nitroGLYCERIN (NITROSTAT) 0.4 MG SL tablet, Place 1 tablet under tongue (let dissolve) every 5 minutes as needed., Disp: 25 tablet, Rfl: 3   nystatin (MYCOSTATIN) 100000 UNIT/ML suspension, Take 5 mLs (500,000 Units total) by mouth 4 (four) times daily., Disp: 100 mL, Rfl: 0   nystatin cream (MYCOSTATIN), Apply topically daily., Disp: 15 g, Rfl: 0   Semaglutide,0.25 or 0.5MG /DOS, 2 MG/3ML SOPN, Inject 0.25 mg into the skin as directed once a week. (Patient not taking: Reported on 07/28/2022), Disp: 3 mL, Rfl: 3    zolpidem (AMBIEN) 5 MG tablet, Take 1 tablet (5 mg total) by mouth at bedtime as needed for sleep., Disp: 30 tablet, Rfl: 0     ROS:  Review of Systems BREAST: No symptoms   Objective: There were no vitals taken for this visit.   OBGyn Exam  Results: No results found for this or any previous visit (from the past 24 hour(s)).  Assessment/Plan: No diagnosis found.  No orders of the defined types were placed in this encounter.            GYN counsel {counseling: 16159}     F/U  No follow-ups on file.  Korissa Horsford B. Shigeko Manard, PA-C 09/06/2022 4:45 PM

## 2022-09-07 ENCOUNTER — Encounter: Payer: Self-pay | Admitting: Obstetrics and Gynecology

## 2022-09-07 ENCOUNTER — Ambulatory Visit (INDEPENDENT_AMBULATORY_CARE_PROVIDER_SITE_OTHER): Payer: 59 | Admitting: Obstetrics and Gynecology

## 2022-09-07 VITALS — BP 110/80 | Ht 65.0 in | Wt 201.0 lb

## 2022-09-07 DIAGNOSIS — N939 Abnormal uterine and vaginal bleeding, unspecified: Secondary | ICD-10-CM

## 2022-09-07 DIAGNOSIS — G4700 Insomnia, unspecified: Secondary | ICD-10-CM

## 2022-09-07 DIAGNOSIS — Z01419 Encounter for gynecological examination (general) (routine) without abnormal findings: Secondary | ICD-10-CM | POA: Diagnosis not present

## 2022-09-07 DIAGNOSIS — K439 Ventral hernia without obstruction or gangrene: Secondary | ICD-10-CM

## 2022-09-07 NOTE — Patient Instructions (Signed)
I value your feedback and you entrusting us with your care. If you get a [facility] patient survey, I would appreciate you taking the time to let us know about your experience today. Thank you! ? ? ?

## 2022-10-07 LAB — HM DIABETES EYE EXAM

## 2022-10-14 ENCOUNTER — Other Ambulatory Visit: Payer: Self-pay

## 2022-10-18 ENCOUNTER — Other Ambulatory Visit: Payer: Self-pay

## 2022-10-19 ENCOUNTER — Other Ambulatory Visit: Payer: Self-pay

## 2022-10-29 ENCOUNTER — Other Ambulatory Visit: Payer: Self-pay
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
## 2022-11-26 ENCOUNTER — Other Ambulatory Visit (HOSPITAL_COMMUNITY): Payer: Self-pay

## 2022-11-26 ENCOUNTER — Other Ambulatory Visit: Payer: Self-pay

## 2022-12-28 ENCOUNTER — Ambulatory Visit (HOSPITAL_BASED_OUTPATIENT_CLINIC_OR_DEPARTMENT_OTHER): Payer: Self-pay | Admitting: Family Medicine

## 2023-01-12 ENCOUNTER — Ambulatory Visit (HOSPITAL_BASED_OUTPATIENT_CLINIC_OR_DEPARTMENT_OTHER): Payer: Medicaid Other | Admitting: Family Medicine

## 2023-01-12 ENCOUNTER — Encounter (HOSPITAL_BASED_OUTPATIENT_CLINIC_OR_DEPARTMENT_OTHER): Payer: Self-pay | Admitting: Family Medicine

## 2023-01-12 VITALS — BP 132/87 | HR 85 | Ht 65.0 in | Wt 196.8 lb

## 2023-01-12 DIAGNOSIS — E1165 Type 2 diabetes mellitus with hyperglycemia: Secondary | ICD-10-CM

## 2023-01-12 DIAGNOSIS — Z7689 Persons encountering health services in other specified circumstances: Secondary | ICD-10-CM | POA: Diagnosis not present

## 2023-01-12 LAB — POCT GLYCOSYLATED HEMOGLOBIN (HGB A1C): Hemoglobin A1C: 11.5 % — AB (ref 4.0–5.6)

## 2023-01-12 MED ORDER — BLOOD GLUCOSE TEST VI STRP: 1.0000 | ORAL_STRIP | Freq: Three times a day (TID) | 3 refills | Status: AC

## 2023-01-12 MED ORDER — TIRZEPATIDE 5 MG/0.5ML ~~LOC~~ SOAJ: 5.0000 mg | SUBCUTANEOUS | 0 refills | Status: DC

## 2023-01-12 MED ORDER — LANCET DEVICE MISC: 0 refills | Status: AC

## 2023-01-12 MED ORDER — LANCETS MISC. MISC: 1.0000 | Freq: Three times a day (TID) | 3 refills | Status: AC

## 2023-01-12 MED ORDER — TIRZEPATIDE 2.5 MG/0.5ML ~~LOC~~ SOAJ: 2.5000 mg | SUBCUTANEOUS | 0 refills | Status: DC

## 2023-01-12 MED ORDER — BLOOD GLUCOSE MONITORING SUPPL DEVI: 1.0000 | Freq: Three times a day (TID) | 0 refills | Status: AC

## 2023-01-12 NOTE — Progress Notes (Signed)
New Patient Office Visit  Subjective:   Cheryl Carpenter [birth year] 01/12/2023  Chief Complaint  Patient presents with   Diabetes    Has not been taking any medications ran out, would like to discuss medication options    HPI: Cheryl Carpenter presents today to establish care at [facility] at [facility]. Introduced to Publishing rights manager role and practice setting.  All questions answered.   Last PCP: [facility] Last annual physical: 2023 Concerns: See below    DIABETES MELLITUS: Cheryl Carpenter presents for the medical management of diabetes.  Current diabetes medication regimen: Glimepiride (previously Metformin, Ozempic, Long Acting Insulin).  Patient has been out of medications for several months.  She does not check blood sugars regularly.  Patient is not adhering to a diabetic diet.  Patient is exercising regularly due to work.  Patient is not checking BS regularly.  Patient is  checking their feet regularly.  Denies polydipsia, polyphagia, polyuria, open wounds or ulcers on feet.   Lab Results  Component Value Date   HGBA1C 11.5 (A) 01/12/2023    01/12/2023 Foot Exam Completed Lab Results  Component Value Date   MICROALBUR 0.3 10/09/2019    Wt Readings from Last 3 Encounters:  01/12/23 196 lb 12.8 oz (89.3 kg)  09/07/22 201 lb (91.2 kg)  07/12/22 180 lb (81.6 kg)    The following portions of the patient's history were reviewed and updated as appropriate: past medical history, past surgical history, family history, social history, allergies, medications, and problem list.   Patient Active Problem List   Diagnosis Date Noted   Insomnia 04/28/2021   Class 1 obesity without serious comorbidity with body mass index (BMI) of 32.0 to 32.9 in adult 10/23/2019   Annual physical exam 10/09/2019   Menorrhagia with regular cycle 12/06/2018   Diabetes mellitus, type 2 (HCC) 11/10/2016   Allergic  rhinitis 03/21/2010   Past Medical History:  Diagnosis Date   Allergy    Diabetes mellitus without complication (HCC)    Diastasis recti 02/15/2022   Obesity affecting pregnancy, antepartum 05/30/2017   Pre-existing diabetes mellitus in pregnancy 12/13/2016   Current Diabetic Medications:  Metformin   Arly.[email or web address] ] Aspirin 81 mg daily after 12 weeks; discontinue after 36 weeks (= A2/B GDM)  - metformin BID     For A2/B GDM or higher classes of DM  [X]  Diabetes Education and Testing Supplies  [ ]  Nutrition Counsult  [x]  Fetal ECHO after 22-24 weeks   [ ]  Eye exam for retina evaluation   [ ]  Baseline EKG  [X]  US fetal growth every 4 weeks starting at 28 weeks       Past Surgical History:  Procedure Laterality Date   CESAREAN SECTION  2010   failed VAVD   CESAREAN SECTION N/A 06/28/2017   Procedure: CESAREAN SECTION, BTL;  Surgeon: Vena Austria, MD;  Location: [facility] ORS;  Service: Obstetrics;  Laterality: N/A;   FOOT SURGERY     nexplanon removal  07/2016   TUBAL LIGATION     Family History  Problem Relation Age of Onset   Diabetes Father    Social History   Socioeconomic History   Marital status: Divorced    Spouse name: Not on file   Number of children: Not on file   Years of education: Not on file   Highest education level: Some college, no degree  Occupational History   Not on file  Tobacco Use   Smoking status: Never   Smokeless tobacco: Never  Vaping Use   Vaping status: Never Used  Substance and Sexual Activity   Alcohol use: No   Drug use: No   Sexual activity: Not Currently    Birth control/protection: Surgical    Comment: Tubal Ligation  Other Topics Concern   Not on file  Social History Narrative   Not on file   Social Drivers of Health   Financial Resource Strain: Low Risk  (01/11/2023)   Overall Financial Resource Strain (CARDIA)    Difficulty of Paying Living Expenses: Not hard at all  Food Insecurity: No Food Insecurity (01/11/2023)   Hunger Vital Sign     Worried About Running Out of Food in the Last Year: Never true    Ran Out of Food in the Last Year: Never true  Transportation Needs: No Transportation Needs (01/11/2023)   PRAPARE - Administrator, Civil Service (Medical): No    Lack of Transportation (Non-Medical): No  Physical Activity: Unknown (01/11/2023)   Exercise Vital Sign    Days of Exercise per Week: 0 days    Minutes of Exercise per Session: Not on file  Stress: No Stress Concern Present (01/11/2023)   [employer] of Occupational Health - Occupational Stress Questionnaire    Feeling of Stress : Not at all  Social Connections: Moderately Isolated (01/11/2023)   Social Connection and Isolation Panel [NHANES]    Frequency of Communication with Friends and Family: More than three times a week    Frequency of Social Gatherings with Friends and Family: More than three times a week    Attends Religious Services: 1 to 4 times per year    Active Member of [employer] or Organizations: No    Attends Engineer, structural: Not on file    Marital Status: Divorced  Intimate Partner Violence: Not on file   Outpatient Medications Prior to Visit  Medication Sig Dispense Refill   nystatin cream (MYCOSTATIN) Apply topically daily. (Patient taking differently: Apply topically as needed.) 15 g 0   zolpidem (AMBIEN) 5 MG tablet Take 1 tablet (5 mg total) by mouth at bedtime as needed for sleep. 30 tablet 0   glimepiride (AMARYL) 2 MG tablet Take 1 tablet (2 mg total) by mouth daily. (Patient not taking: Reported on 01/12/2023) 90 tablet 2   No facility-administered medications prior to visit.   Allergies  Allergen Reactions   Fish Allergy Anaphylaxis   Peanut-Containing Drug Products Anaphylaxis   Shellfish Allergy Anaphylaxis   Sulfa Antibiotics Nausea And Vomiting    ROS: A complete ROS was performed with pertinent positives/negatives noted in the HPI. The remainder of the ROS are negative.   Objective:    Today's Vitals   01/12/23 0933  BP: 132/87  Pulse: 85  SpO2: 100%  Weight: 196 lb 12.8 oz (89.3 kg)  Height: 5\' 5"  (1.651 m)    GENERAL: Well-appearing, in NAD. Well nourished.  SKIN: Pink, warm and dry. No rash, lesion, ulceration, or ecchymoses.  Head: Normocephalic. NECK: Trachea midline. Full ROM w/o pain or tenderness.  RESPIRATORY: Chest wall symmetrical. Respirations even and non-labored. Breath sounds clear to auscultation bilaterally.  CARDIAC: S1, S2 present, regular rate and rhythm without murmur or gallops. Peripheral pulses 2+ bilaterally.  MSK: Muscle tone and strength appropriate for age.  EXTREMITIES: Without clubbing, cyanosis, or edema.  NEUROLOGIC: No motor or sensory deficits. Steady, even gait. C2-C12 intact.  PSYCH/MENTAL STATUS: Alert, oriented x  3. Cooperative, appropriate mood and affect.   Diabetic Foot Exam - Simple   Simple Foot Form Diabetic Foot exam was performed with the following findings: Yes 01/12/2023  1:05 PM  Visual Inspection No deformities, no ulcerations, no other skin breakdown bilaterally: Yes Sensation Testing Intact to touch and monofilament testing bilaterally: Yes Pulse Check Posterior Tibialis and Dorsalis pulse intact bilaterally: Yes Comments     Health Maintenance Due  Topic Date Due   Diabetic kidney evaluation - Urine ACR  Never done   Hepatitis C Screening  Never done    Results for orders placed or performed in visit on 01/12/23  POCT HgB A1C  Result Value Ref Range   Hemoglobin A1C 11.5 (A) 4.0 - 5.6 %   HbA1c POC (<> result, manual entry)     HbA1c, POC (prediabetic range)     HbA1c, POC (controlled diabetic range)         Assessment & Plan:  1. Encounter to establish care with new doctor (Primary) Discussed role of PCP and reviewed patient's health maintenance items.  She is up-to-date on preventative screenings and routine vaccines.  Will return for follow-up with type 2 diabetes and annual exam in 3  months.  2. Type 2 diabetes mellitus with hyperglycemia, without long-term current use of insulin (HCC) A1c 11.5 in office today.  We had lengthy discussion regarding medication therapy including GLP-1, Lantus, oral antidiabetic medications and patient would like to try Mounjaro.  We also discussed possibility of having to add Lantus if blood sugars do not improve with Mounjaro and patient verbalized understanding.  We will start Mounjaro 2.5 mg x 4 weeks and increase to 5 mg after 4 weeks.  We will recheck her A1c in approximately 3 months.  Urine albumin and foot exam completed today.  Patient is up-to-date on eye exam in the past year.  Will obtain CMP and lipid profile today as well.  We discussed possible side effects and adverse effects of taking Mounjaro and patient verbalized understanding.  Denied family history of medullary thyroid cancer or M EN 2  - POCT HgB A1C - POCT UA - Microalbumin - Comprehensive metabolic panel - Lipid panel - Microalbumin / creatinine urine ratio  Patient to reach out to office if new, worrisome, or unresolved symptoms arise or if no improvement in patient's condition. Patient verbalized understanding and is agreeable to treatment plan. All questions answered to patient's satisfaction.    Return in about 3 months (around 04/12/2023) for DIABETES CHECK UP, ANNUAL PHYSICAL.    Hilbert Bible, [place]

## 2023-01-13 ENCOUNTER — Other Ambulatory Visit (HOSPITAL_BASED_OUTPATIENT_CLINIC_OR_DEPARTMENT_OTHER): Payer: Self-pay | Admitting: Family Medicine

## 2023-01-13 DIAGNOSIS — E781 Pure hyperglyceridemia: Secondary | ICD-10-CM | POA: Insufficient documentation

## 2023-01-13 LAB — LIPID PANEL
Chol/HDL Ratio: 18.4 {ratio} — ABNORMAL HIGH (ref 0.0–4.4)
Cholesterol, Total: 184 mg/dL (ref 100–199)
HDL: 10 mg/dL — ABNORMAL LOW (ref >39)
Triglycerides: 2281 mg/dL (ref 0–149)

## 2023-01-13 LAB — COMPREHENSIVE METABOLIC PANEL
ALT: 14 [IU]/L (ref 0–32)
AST: 20 [IU]/L (ref 0–40)
Albumin: 3.9 g/dL (ref 3.9–4.9)
Alkaline Phosphatase: 69 [IU]/L (ref 44–121)
BUN/Creatinine Ratio: 9 (ref 9–23)
BUN: 5 mg/dL — ABNORMAL LOW (ref 6–20)
Bilirubin Total: 0.3 mg/dL (ref 0.0–1.2)
CO2: 18 mmol/L — ABNORMAL LOW (ref 20–29)
Calcium: 9 mg/dL (ref 8.7–10.2)
Chloride: 96 mmol/L (ref 96–106)
Creatinine, Ser: 0.57 mg/dL (ref 0.57–1.00)
Globulin, Total: 3.2 g/dL (ref 1.5–4.5)
Glucose: 284 mg/dL — ABNORMAL HIGH (ref 70–99)
Potassium: 4.2 mmol/L (ref 3.5–5.2)
Sodium: 134 mmol/L (ref 134–144)
Total Protein: 7.1 g/dL (ref 6.0–8.5)
eGFR: 122 mL/min/{1.73_m2} (ref >59)

## 2023-01-13 LAB — MICROALBUMIN / CREATININE URINE RATIO
Creatinine, Urine: 52.5 mg/dL
Microalb/Creat Ratio: 12 mg/g{creat} (ref 0–29)
Microalbumin, Urine: 6.2 ug/mL

## 2023-01-13 NOTE — Progress Notes (Signed)
Urine albumin reviewed.

## 2023-01-13 NOTE — Progress Notes (Signed)
Cheryl Carpenter, please schedule patient for a lab follow-up in 3 weeks.  I will place the order for lipid panel.  Discussed patient's lab results with her over the phone regarding extremely elevated triglyceride level likely due to uncontrolled type 2 diabetes with most recent A1c over 11.  Patient has been started on [facility] and we discussed in depth dietary changes at her visit and also reinforced over the phone.  Recommend starting omega-3 1000 mg daily, avoiding alcohol, decrease of fat consumption and recheck in 3 weeks.  Patient verbalized understanding and also verbalized understanding of signs and symptoms of pancreatitis and high risk due to elevated triglycerides.

## 2023-01-14 ENCOUNTER — Encounter (HOSPITAL_BASED_OUTPATIENT_CLINIC_OR_DEPARTMENT_OTHER): Payer: Self-pay | Admitting: Family Medicine

## 2023-01-14 ENCOUNTER — Other Ambulatory Visit (HOSPITAL_BASED_OUTPATIENT_CLINIC_OR_DEPARTMENT_OTHER): Payer: Self-pay | Admitting: Family Medicine

## 2023-01-14 MED ORDER — OZEMPIC (0.25 OR 0.5 MG/DOSE) 2 MG/3ML ~~LOC~~ SOPN: PEN_INJECTOR | SUBCUTANEOUS | 1 refills | Status: DC

## 2023-01-14 NOTE — Telephone Encounter (Signed)
PA for mounjaro was initiated. Below is the outcome:  Outcome Denied today by [facility] 2017 Denied. Per the health plan preferred drug list, at least 2 preferred drugs must be tried before requesting this drug or tell us why the member cannot try any preferred alternatives. Please send [place] supporting chart notes and lab results. Here is list of preferred alternatives: Byetta pen, Trulicity pen, Victoza pen, Ozempic pen. Note: Some preferred drug(s) may have quantity limits. Refer to the health plan's preferred drug list for additional details. Additionally, per the health plan guideline, GLP-1 Receptor Agonists and Combinations, the clinical criteria below must be met before this request can be approved. Please send [place] supporting chart notes and lab results. - In addition, for non-preferred products, the beneficiary must have tried and failed or experienced an insufficient response to at least two preferred products or have a clinical reason that preferred products cannot be tried    Jon Gills, please advise.

## 2023-02-03 ENCOUNTER — Other Ambulatory Visit (HOSPITAL_BASED_OUTPATIENT_CLINIC_OR_DEPARTMENT_OTHER): Payer: Medicaid Other

## 2023-02-03 DIAGNOSIS — E781 Pure hyperglyceridemia: Secondary | ICD-10-CM

## 2023-02-04 LAB — LIPID PANEL
Chol/HDL Ratio: 7.1 {ratio} — ABNORMAL HIGH (ref 0.0–4.4)
Cholesterol, Total: 114 mg/dL (ref 100–199)
HDL: 16 mg/dL — ABNORMAL LOW (ref >39)
LDL Chol Calc (NIH): 40 mg/dL (ref 0–99)
Triglycerides: 392 mg/dL — ABNORMAL HIGH (ref 0–149)
VLDL Cholesterol Cal: 58 mg/dL — ABNORMAL HIGH (ref 5–40)

## 2023-02-09 ENCOUNTER — Other Ambulatory Visit (HOSPITAL_BASED_OUTPATIENT_CLINIC_OR_DEPARTMENT_OTHER): Payer: Self-pay | Admitting: Family Medicine

## 2023-02-09 ENCOUNTER — Encounter (HOSPITAL_BASED_OUTPATIENT_CLINIC_OR_DEPARTMENT_OTHER): Payer: Self-pay | Admitting: Family Medicine

## 2023-02-09 DIAGNOSIS — E1165 Type 2 diabetes mellitus with hyperglycemia: Secondary | ICD-10-CM

## 2023-02-09 MED ORDER — SEMAGLUTIDE (1 MG/DOSE) 4 MG/3ML ~~LOC~~ SOPN: 1.0000 mg | PEN_INJECTOR | SUBCUTANEOUS | 3 refills | Status: DC

## 2023-02-09 NOTE — Progress Notes (Signed)
 Hi Cheryl Carpenter, Your triglycerides have significantly improved in addition to cholesterol levels.  I would like you to continue on the Ozempic  and continue with omega-3 fish oil as directed and we will repeat this at your appointment in March.  How are your blood sugars running?  Would you be willing to go up on your dosage of Ozempic  at this time to 1 mg?  If you have further questions please let me know

## 2023-02-22 ENCOUNTER — Encounter (HOSPITAL_BASED_OUTPATIENT_CLINIC_OR_DEPARTMENT_OTHER): Payer: Self-pay | Admitting: Family Medicine

## 2023-02-23 NOTE — Telephone Encounter (Signed)
Jon Gills, please see mychart sent by pt and advise.

## 2023-02-25 NOTE — Telephone Encounter (Signed)
Pt sent message saying that she will occasionally have palpitations.

## 2023-04-12 ENCOUNTER — Encounter (HOSPITAL_BASED_OUTPATIENT_CLINIC_OR_DEPARTMENT_OTHER): Payer: Medicaid Other | Admitting: Family Medicine

## 2023-04-25 ENCOUNTER — Encounter (HOSPITAL_BASED_OUTPATIENT_CLINIC_OR_DEPARTMENT_OTHER): Payer: Self-pay | Admitting: *Deleted

## 2023-04-25 ENCOUNTER — Ambulatory Visit (INDEPENDENT_AMBULATORY_CARE_PROVIDER_SITE_OTHER): Admitting: Family Medicine

## 2023-04-25 ENCOUNTER — Encounter (HOSPITAL_BASED_OUTPATIENT_CLINIC_OR_DEPARTMENT_OTHER): Payer: Self-pay | Admitting: Family Medicine

## 2023-04-25 VITALS — BP 110/82 | HR 109 | Ht 65.0 in | Wt 193.0 lb

## 2023-04-25 DIAGNOSIS — Z7985 Long-term (current) use of injectable non-insulin antidiabetic drugs: Secondary | ICD-10-CM

## 2023-04-25 DIAGNOSIS — R400 Somnolence: Secondary | ICD-10-CM | POA: Diagnosis not present

## 2023-04-25 DIAGNOSIS — Z0000 Encounter for general adult medical examination without abnormal findings: Secondary | ICD-10-CM

## 2023-04-25 DIAGNOSIS — R000 Tachycardia, unspecified: Secondary | ICD-10-CM | POA: Diagnosis not present

## 2023-04-25 DIAGNOSIS — E1165 Type 2 diabetes mellitus with hyperglycemia: Secondary | ICD-10-CM | POA: Diagnosis not present

## 2023-04-25 DIAGNOSIS — E781 Pure hyperglyceridemia: Secondary | ICD-10-CM | POA: Diagnosis not present

## 2023-04-25 MED ORDER — METOPROLOL TARTRATE 25 MG PO TABS: 25.0000 mg | ORAL_TABLET | Freq: Two times a day (BID) | ORAL | 3 refills | Status: AC | PRN

## 2023-04-25 NOTE — Progress Notes (Signed)
 Subjective:   Cheryl Carpenter 29-Dec-1990  04/25/2023   CC: Chief Complaint  Patient presents with   Annual Exam    Patient is here today for her physical. Denies any main concerns for today's visit.    HPI: Cheryl Carpenter is a 33 y.o. female who presents for a routine health maintenance exam.  Labs collected at time of visit.   HX TACHYCARDIA:  Patient states she was previously diagnosed with sinus tachycardia and placed on metoprolol PRN by previous cardiologist. Patient states she has not been taking for several months. She states her watch will frequently tell her her HR is high or elevated. Highest reading is 130 bpm. Patient states she has noticed this more during sleeping and lying down at night. She does report chronic fatigue during the day. She does work 3rd shift. Denies hx of OSA but has not been tested. Unsure if she snores.   DIABETES MELLITUS: Cheryl Carpenter presents for the medical management of diabetes.  Current diabetes medication regimen: Ozempic 1mg  weekly Patient is  adhering to a diabetic diet.  Patient is  exercising regularly.  Patient is  checking BS regularly. Avg: 116-130's  Patient is  checking their feet regularly.  Denies polydipsia, polyphagia, polyuria, open wounds or ulcers on feet.   Lab Results  Component Value Date   HGBA1C 11.5 (A) 01/12/2023    Foot Exam: 01/12/2023 Lab Results  Component Value Date   LABMICR 6.2 01/12/2023   MICROALBUR 0.3 10/09/2019    Wt Readings from Last 3 Encounters:  04/25/23 193 lb (87.5 kg)  01/12/23 196 lb 12.8 oz (89.3 kg)  09/07/22 201 lb (91.2 kg)   HYPERLIPIDEMIA: Cheryl Carpenter presents for the medical management of hyperlipidemia.  Patient's current HLD regimen is: Omega 3, Diet and exercise Patient is  currently taking prescribed medications for HLD.  Adhering to heathy diet: Yes Exercising regularly: yes  Lab Results  Component Value Date   CHOL 114 02/03/2023   CHOL 184 01/12/2023    CHOL 114 10/13/2021   Lab Results  Component Value Date   HDL 16 (L) 02/03/2023   HDL 10 (L) 01/12/2023   HDL 15 (L) 10/13/2021   Lab Results  Component Value Date   LDLCALC 40 02/03/2023   LDLCALC Comment (A) 01/12/2023   LDLCALC  10/13/2021     Comment:     . LDL cholesterol not calculated. Triglyceride levels greater than 400 mg/dL invalidate calculated LDL results. . Reference range: <100 . Desirable range <100 mg/dL for primary prevention;   <70 mg/dL for patients with CHD or diabetic patients  with > or = 2 CHD risk factors. Marland Kitchen LDL-C is now calculated using the Martin-Hopkins  calculation, which is a validated novel method providing  better accuracy than the Friedewald equation in the  estimation of LDL-C.  Cheryl Carpenter et al. Lenox Ahr. 1610;960(45): 2061-2068  (http://education.[facility].com/faq/FAQ164)    Lab Results  Component Value Date   TRIG 392 (H) 02/03/2023   TRIG 2,281 (HH) 01/12/2023   TRIG 1,183 (H) 10/13/2021   Lab Results  Component Value Date   CHOLHDL 7.1 (H) 02/03/2023   CHOLHDL 18.4 (H) 01/12/2023   CHOLHDL 7.6 (H) 10/13/2021   No results found for: "LDLDIRECT"   HEALTH SCREENINGS: - Vision Screening: up to date - Dental Visits: up to date - Pap smear: up to date - Breast Exam: up to date - STD Screening: Declined - Mammogram (40+): Not applicable  - Colonoscopy (45+): Not  applicable  - Bone Density (65+ or under 65 with predisposing conditions): Not applicable  - Lung CA screening with low-dose CT:  Not applicable Adults age 72-80 who are current cigarette smokers or quit within the last 15 years. Must have 20 pack year history.   Depression and Anxiety Screen done today and results listed below:     04/25/2023    8:28 AM 01/12/2023    9:36 AM 10/08/2021   10:59 AM 10/09/2019   11:28 AM  Depression screen PHQ 2/9  Decreased Interest 0 0 0 0  Down, Depressed, Hopeless 0 0 0 0  PHQ - 2 Score 0 0 0 0  Altered sleeping 0      Tired, decreased energy 0     Change in appetite 0     Feeling bad or failure about yourself  0     Trouble concentrating 0     Moving slowly or fidgety/restless 0     Suicidal thoughts 0     PHQ-9 Score 0     Difficult doing work/chores Not difficult at all         04/25/2023    8:29 AM  GAD 7 : Generalized Anxiety Score  Nervous, Anxious, on Edge 0  Control/stop worrying 0  Worry too much - different things 0  Trouble relaxing 0  Restless 0  Easily annoyed or irritable 0  Afraid - awful might happen 0  Total GAD 7 Score 0  Anxiety Difficulty Not difficult at all    IMMUNIZATIONS: - Tdap: Tetanus vaccination status reviewed: last tetanus booster within 10 years. - HPV: Not applicable - Influenza: Postponed to flu season - Pneumovax: Not applicable - Prevnar 20: Not applicable - Shingrix (50+): Not applicable   Past medical history, surgical history, medications, allergies, family history and social history reviewed with patient today and changes made to appropriate areas of the chart.   Past Medical History:  Diagnosis Date   Allergy    Diabetes mellitus without complication (HCC)    Diastasis recti 02/15/2022   Obesity affecting pregnancy, antepartum 05/30/2017   Pre-existing diabetes mellitus in pregnancy 12/13/2016   Current Diabetic Medications:  Metformin   Arly.[email or web address] ] Aspirin 81 mg daily after 12 weeks; discontinue after 36 weeks (= A2/B GDM)  - metformin BID     For A2/B GDM or higher classes of DM  [X]  Diabetes Education and Testing Supplies  [ ]  Nutrition Counsult  [x]  Fetal ECHO after 22-24 weeks   [ ]  Eye exam for retina evaluation   [ ]  Baseline EKG  [X]  US fetal growth every 4 weeks starting at 28 weeks        Past Surgical History:  Procedure Laterality Date   CESAREAN SECTION  2010   failed VAVD   CESAREAN SECTION N/A 06/28/2017   Procedure: CESAREAN SECTION, BTL;  Surgeon: Vena Austria, MD;  Location: [facility] ORS;  Service: Obstetrics;  Laterality: N/A;    FOOT SURGERY     nexplanon removal  07/2016   TUBAL LIGATION      Current Outpatient Medications on File Prior to Visit  Medication Sig   Blood Glucose Monitoring Suppl DEVI 1 each by Does not apply route in the morning, at noon, and at bedtime. May substitute to any manufacturer covered by patient's insurance.   Lancet Device MISC Use 1 each by fingerstick route in the morning, at noon, and at bedtime. May substitute to any manufacturer covered by patient's insurance.   Semaglutide, 1  MG/DOSE, 4 MG/3ML SOPN Inject 1 mg as directed once a week.   zolpidem (AMBIEN) 5 MG tablet Take 1 tablet (5 mg total) by mouth at bedtime as needed for sleep. (Patient not taking: Reported on 04/25/2023)   No current facility-administered medications on file prior to visit.    Allergies  Allergen Reactions   Fish Allergy Anaphylaxis   Peanut-Containing Drug Products Anaphylaxis   Shellfish Allergy Anaphylaxis   Sulfa Antibiotics Nausea And Vomiting     Social History   Socioeconomic History   Marital status: Divorced    Spouse name: Not on file   Number of children: Not on file   Years of education: Not on file   Highest education level: Some college, no degree  Occupational History   Not on file  Tobacco Use   Smoking status: Never   Smokeless tobacco: Never  Vaping Use   Vaping status: Never Used  Substance and Sexual Activity   Alcohol use: No   Drug use: No   Sexual activity: Not Currently    Birth control/protection: Surgical    Comment: Tubal Ligation  Other Topics Concern   Not on file  Social History Narrative   Not on file   Social Drivers of Health   Financial Resource Strain: Low Risk  (01/11/2023)   Overall Financial Resource Strain (CARDIA)    Difficulty of Paying Living Expenses: Not hard at all  Food Insecurity: No Food Insecurity (01/11/2023)   Hunger Vital Sign    Worried About Running Out of Food in the Last Year: Never true    Ran Out of Food in the Last  Year: Never true  Transportation Needs: No Transportation Needs (01/11/2023)   PRAPARE - Administrator, Civil Service (Medical): No    Lack of Transportation (Non-Medical): No  Physical Activity: Inactive (04/25/2023)   Exercise Vital Sign    Days of Exercise per Week: 0 days    Minutes of Exercise per Session: 0 min  Stress: No Stress Concern Present (01/11/2023)   [employer] of Occupational Health - Occupational Stress Questionnaire    Feeling of Stress : Not at all  Social Connections: Moderately Isolated (01/11/2023)   Social Connection and Isolation Panel [NHANES]    Frequency of Communication with Friends and Family: More than three times a week    Frequency of Social Gatherings with Friends and Family: More than three times a week    Attends Religious Services: 1 to 4 times per year    Active Member of [employer] or Organizations: No    Attends Engineer, structural: Not on file    Marital Status: Divorced  Intimate Partner Violence: Not At Risk (04/25/2023)   Humiliation, Afraid, Rape, and Kick questionnaire    Fear of Current or Ex-Partner: No    Emotionally Abused: No    Physically Abused: No    Sexually Abused: No   Social History   Tobacco Use  Smoking Status Never  Smokeless Tobacco Never   Social History   Substance and Sexual Activity  Alcohol Use No    Family History  Problem Relation Age of Onset   Diabetes Father    Stroke Father    Arthritis Mother    Arthritis Maternal Grandfather      ROS: Denies fever, fatigue, unexplained weight loss/gain, chest pain, SHOB, and palpitations. Denies neurological deficits, gastrointestinal or genitourinary complaints, and skin changes.   Objective:   Today's Vitals   04/25/23 0823  BP:  110/82  Pulse: (!) 109  SpO2: 100%  Weight: 193 lb (87.5 kg)  Height: 5\' 5"  (1.651 m)    GENERAL APPEARANCE: Well-appearing, in NAD. Well nourished.  SKIN: Pink, warm and dry. Turgor normal. No rash,  lesion, ulceration, or ecchymoses. Hair evenly distributed.  HEENT: HEAD: Normocephalic.  EYES: PERRLA. EOMI. Lids intact w/o defect. Sclera white, Conjunctiva pink w/o exudate.  EARS: External ear w/o redness, swelling, masses or lesions. EAC clear. TM's intact, translucent w/o bulging, appropriate landmarks visualized. Appropriate acuity to conversational tones.  NOSE: Septum midline w/o deformity. Nares patent, mucosa pink and non-inflamed w/o drainage. No sinus tenderness.  THROAT: Uvula midline. Oropharynx clear. Tonsils non-inflamed w/o exudate. Oral mucosa pink and moist.  NECK: Supple, Trachea midline. Full ROM w/o pain or tenderness. No lymphadenopathy. Thyroid non-tender w/o enlargement or palpable masses.  BREASTS: Breasts pendulous, symmetrical, and w/o palpable masses. Nipples everted and w/o discharge. No rash or skin retraction. No axillary or supraclavicular lymphadenopathy.  RESPIRATORY: Chest wall symmetrical w/o masses. Respirations even and non-labored. Breath sounds clear to auscultation bilaterally. No wheezes, rales, rhonchi, or crackles. CARDIAC: S1, S2 present, regular rate and rhythm. No gallops, murmurs, rubs, or clicks. PMI w/o lifts, heaves, or thrills. No carotid bruits. Capillary refill <2 seconds. Peripheral pulses 2+ bilaterally. GI: Abdomen soft w/o distention. Normoactive bowel sounds. No palpable masses or tenderness. No guarding or rebound tenderness. Liver and spleen w/o tenderness or enlargement. No CVA tenderness.  GU: External genitalia without erythema, lesions, or masses. No lymphadenopathy. Vaginal mucosa pink and moist without exudate, lesions, or ulcerations. Cervix pink without discharge. Cervical os closed. Uterus and adnexae palpable, not enlarged, and w/o tenderness. No palpable masses.  MSK: Muscle tone and strength appropriate for age, w/o atrophy or abnormal movement.  EXTREMITIES: Active ROM intact, w/o tenderness, crepitus, or contracture. No  obvious joint deformities or effusions. No clubbing, edema, or cyanosis.  NEUROLOGIC: CN's II-XII intact. Motor strength symmetrical with no obvious weakness. No sensory deficits. DTR's 2+ symmetric bilaterally. Steady, even gait.  PSYCH/MENTAL STATUS: Alert, oriented x 3. Cooperative, appropriate mood and affect.   EKG:  Vent rate 107 bpm PR Interval 158 ms QRS 88ms QT/Qtc-Baz 344/459 P-R-T 41 49 2   Sinus tachycardia Nonspecific T wave abnormality Abnormal ECG  Compared to EKG 07/12/2022, sinus tachycardia is still present. Nonspecific T wave abnormality is present.    Assessment & Plan:  1. Annual physical exam (Primary) Discussed annual exam, will obtain lab work, and patient is up-to-date on all preventative screenings and vaccinations.  2. Daytime somnolence Given ongoing sinus tachycardia, daytime hypersomnolence, and history of diabetes, recommend referral to pulmonology for OSA evaluation.  Patient agreeable and referral placed. - Ambulatory referral to Pulmonology  3. Type 2 diabetes mellitus with hyperglycemia, without long-term current use of insulin (HCC) A1c likely improved given patient's average blood glucose readings.  Will obtain A1c and BMP with lab work today.  Will titrate patient's Ozempic given results.  Patient doing well with diet and exercise. - Hemoglobin A1c - Basic Metabolic Panel (BMET)  4. Hypertriglyceridemia Patient likely improving her triglycerides with diet changes, exercise and improve diabetic control.  Will obtain lipid panel today - Lipid panel  5. Sinus tachycardia Uncontrolled.  Patient to take metoprolol 25 mg twice daily as needed for palpitations and HR over 110 bpm. Will check CBC for possible anemia, TSH for thyroid disease to rule out possible cause. EKG is unremarkable for acute cause. Possible OSA contributing and will obtain sleep evaluation  with Pulm.   - metoprolol tartrate (LOPRESSOR) 25 MG tablet; Take 1 tablet (25 mg total)  by mouth 2 (two) times daily as needed (for heart rate over 110 bpm).  Dispense: 180 tablet; Refill: 3 - CBC with Differential/Platelet - TSH Rfx on Abnormal to Free T4   Orders Placed This Encounter  Procedures   CBC with Differential/Platelet   Hemoglobin A1c   Lipid panel   TSH Rfx on Abnormal to Free T4   Basic Metabolic Panel (BMET)   Ambulatory referral to Pulmonology    Referral Priority:   Routine    Referral Type:   Consultation    Referral Reason:   Specialty Services Required    Requested Specialty:   Pulmonary Disease    Number of Visits Requested:   1    PATIENT COUNSELING:  - Encouraged a healthy well-balanced diet. Patient may adjust caloric intake to maintain or achieve ideal body weight. May reduce intake of dietary saturated fat and total fat and have adequate dietary potassium and calcium preferably from fresh fruits, vegetables, and low-fat dairy products.   - Advised to avoid cigarette smoking. - Discussed with the patient that most people either abstain from alcohol or drink within safe limits (<=14/week and <=4 drinks/occasion for males, <=7/weeks and <= 3 drinks/occasion for females) and that the risk for alcohol disorders and other health effects rises proportionally with the number of drinks per week and how often a drinker exceeds daily limits. - Discussed cessation/primary prevention of drug use and availability of treatment for abuse.  - Discussed sexually transmitted diseases, avoidance of unintended pregnancy and contraceptive alternatives.  - Stressed the importance of regular exercise - Injury prevention: Discussed safety belts, safety helmets, smoke detector, smoking near bedding or upholstery.  - Dental health: Discussed importance of regular tooth brushing, flossing, and dental visits.   NEXT PREVENTATIVE PHYSICAL DUE IN 1 YEAR.  Return in about 4 months (around 08/25/2023) for DIABETES CHECK UP.  Patient to reach out to office if new, worrisome,  or unresolved symptoms arise or if no improvement in patient's condition. Patient verbalized understanding and is agreeable to treatment plan. All questions answered to patient's satisfaction.    Hilbert Bible, [place]

## 2023-04-26 ENCOUNTER — Encounter (HOSPITAL_BASED_OUTPATIENT_CLINIC_OR_DEPARTMENT_OTHER): Payer: Self-pay | Admitting: Family Medicine

## 2023-04-26 ENCOUNTER — Other Ambulatory Visit (HOSPITAL_BASED_OUTPATIENT_CLINIC_OR_DEPARTMENT_OTHER): Payer: Self-pay | Admitting: Family Medicine

## 2023-04-26 DIAGNOSIS — E1165 Type 2 diabetes mellitus with hyperglycemia: Secondary | ICD-10-CM

## 2023-04-26 LAB — CBC WITH DIFFERENTIAL/PLATELET
Basophils Absolute: 0 10*3/uL (ref 0.0–0.2)
Basos: 0 %
EOS (ABSOLUTE): 0.1 10*3/uL (ref 0.0–0.4)
Eos: 1 %
Hematocrit: 38.6 % (ref 34.0–46.6)
Hemoglobin: 13.1 g/dL (ref 11.1–15.9)
Immature Grans (Abs): 0 10*3/uL (ref 0.0–0.1)
Immature Granulocytes: 0 %
Lymphocytes Absolute: 1.7 10*3/uL (ref 0.7–3.1)
Lymphs: 17 %
MCH: 31.2 pg (ref 26.6–33.0)
MCHC: 33.9 g/dL (ref 31.5–35.7)
MCV: 92 fL (ref 79–97)
Monocytes Absolute: 0.4 10*3/uL (ref 0.1–0.9)
Monocytes: 4 %
Neutrophils Absolute: 7.9 10*3/uL — ABNORMAL HIGH (ref 1.4–7.0)
Neutrophils: 78 %
Platelets: 259 10*3/uL (ref 150–450)
RBC: 4.2 x10E6/uL (ref 3.77–5.28)
RDW: 12.4 % (ref 11.7–15.4)
WBC: 10.2 10*3/uL (ref 3.4–10.8)

## 2023-04-26 LAB — HEMOGLOBIN A1C
Est. average glucose Bld gHb Est-mCnc: 174 mg/dL
Hgb A1c MFr Bld: 7.7 % — ABNORMAL HIGH (ref 4.8–5.6)

## 2023-04-26 LAB — BASIC METABOLIC PANEL WITH GFR
BUN/Creatinine Ratio: 15 (ref 9–23)
BUN: 11 mg/dL (ref 6–20)
CO2: 22 mmol/L (ref 20–29)
Calcium: 8.8 mg/dL (ref 8.7–10.2)
Chloride: 100 mmol/L (ref 96–106)
Creatinine, Ser: 0.75 mg/dL (ref 0.57–1.00)
Glucose: 220 mg/dL — ABNORMAL HIGH (ref 70–99)
Potassium: 3.9 mmol/L (ref 3.5–5.2)
Sodium: 136 mmol/L (ref 134–144)
eGFR: 106 mL/min/{1.73_m2} (ref >59)

## 2023-04-26 LAB — LIPID PANEL
Chol/HDL Ratio: 6.9 ratio — ABNORMAL HIGH (ref 0.0–4.4)
Cholesterol, Total: 111 mg/dL (ref 100–199)
HDL: 16 mg/dL — ABNORMAL LOW (ref >39)
LDL Chol Calc (NIH): 26 mg/dL (ref 0–99)
Triglycerides: 503 mg/dL — ABNORMAL HIGH (ref 0–149)
VLDL Cholesterol Cal: 69 mg/dL — ABNORMAL HIGH (ref 5–40)

## 2023-04-26 LAB — TSH RFX ON ABNORMAL TO FREE T4: TSH: 1.16 u[IU]/mL (ref 0.450–4.500)

## 2023-04-26 MED ORDER — OZEMPIC (2 MG/DOSE) 8 MG/3ML ~~LOC~~ SOPN: 2.0000 mg | PEN_INJECTOR | SUBCUTANEOUS | 3 refills | Status: DC

## 2023-04-26 NOTE — Progress Notes (Signed)
 Please ask patient the following:   Hi Cheryl Carpenter, Your A1C has improved greatly from 11.5 down to 7.7.  Your triglyceride increased which is unsual with improvement of your A1C. Did you eat anything after prior to your visit from midnight on until your labs were drawn this day? This would be a reason for this to increase given improvement of your diabetes. Please let me know.   We can also increase your Ozempic if you would like and can tolerate.

## 2023-04-27 ENCOUNTER — Encounter (HOSPITAL_BASED_OUTPATIENT_CLINIC_OR_DEPARTMENT_OTHER): Payer: Self-pay | Admitting: *Deleted

## 2023-04-28 ENCOUNTER — Ambulatory Visit: Admitting: Sleep Medicine

## 2023-05-09 ENCOUNTER — Other Ambulatory Visit (HOSPITAL_BASED_OUTPATIENT_CLINIC_OR_DEPARTMENT_OTHER): Payer: Self-pay | Admitting: Family Medicine

## 2023-05-09 DIAGNOSIS — E781 Pure hyperglyceridemia: Secondary | ICD-10-CM

## 2023-05-11 NOTE — Addendum Note (Signed)
 Addended by: Eve Hinders on: 05/11/2023 10:25 AM   Modules accepted: Orders

## 2023-05-24 ENCOUNTER — Telehealth: Admitting: Physician Assistant

## 2023-05-24 DIAGNOSIS — N760 Acute vaginitis: Secondary | ICD-10-CM | POA: Diagnosis not present

## 2023-05-24 DIAGNOSIS — B9689 Other specified bacterial agents as the cause of diseases classified elsewhere: Secondary | ICD-10-CM

## 2023-05-24 MED ORDER — METRONIDAZOLE 500 MG PO TABS: 500.0000 mg | ORAL_TABLET | Freq: Two times a day (BID) | ORAL | 0 refills | Status: DC

## 2023-05-24 NOTE — Progress Notes (Signed)

## 2023-05-24 NOTE — Progress Notes (Signed)
 I have spent 5 minutes in review of e-visit questionnaire, review and updating patient chart, medical decision making and response to patient.   Piedad Climes, PA-C

## 2023-05-28 ENCOUNTER — Other Ambulatory Visit (HOSPITAL_BASED_OUTPATIENT_CLINIC_OR_DEPARTMENT_OTHER): Payer: Self-pay | Admitting: Family Medicine

## 2023-05-28 DIAGNOSIS — E1165 Type 2 diabetes mellitus with hyperglycemia: Secondary | ICD-10-CM

## 2023-08-10 ENCOUNTER — Ambulatory Visit (INDEPENDENT_AMBULATORY_CARE_PROVIDER_SITE_OTHER): Admitting: Sleep Medicine

## 2023-08-10 ENCOUNTER — Encounter: Payer: Self-pay | Admitting: Sleep Medicine

## 2023-08-10 VITALS — BP 134/90 | HR 115 | Temp 98.0°F | Ht 65.0 in | Wt 185.0 lb

## 2023-08-10 DIAGNOSIS — E119 Type 2 diabetes mellitus without complications: Secondary | ICD-10-CM

## 2023-08-10 DIAGNOSIS — G4733 Obstructive sleep apnea (adult) (pediatric): Secondary | ICD-10-CM | POA: Diagnosis not present

## 2023-08-10 DIAGNOSIS — G4726 Circadian rhythm sleep disorder, shift work type: Secondary | ICD-10-CM | POA: Diagnosis not present

## 2023-08-10 NOTE — Patient Instructions (Signed)
 Cheryl Carpenter

## 2023-08-10 NOTE — Progress Notes (Signed)
 Name:Cheryl Carpenter MRN: [id number] DOB: 1990/06/14   CHIEF COMPLAINT:  EXCESSIVE DAYTIME SLEEPINESS   HISTORY OF PRESENT ILLNESS:  Cheryl Carpenter is a 33 y.o. w/ a h/o DMII, obesity and shift work sleep disorder who presents for c/o loud snoring and excessive daytime sleepiness which has been present for several years. Reports nocturnal awakenings due to unclear reasons, and has difficulty falling back to sleep. Reports a 35 lb weight loss. Admits to occasional headaches. Denies RLS symptoms, dream enactment, cataplexy, hypnagogic or hypnapompic hallucinations. Reports a family history of sleep apnea. Reports drowsy driving. Occasional alcohol use, denies tobacco or illicit drug use.   Bedtime 9:30 am-1:30 pm Sleep onset variable Rise time 7:30-8 am   EPWORTH SLEEP SCORE 10    08/10/2023    1:16 PM  Results of the Epworth flowsheet  Sitting and reading 0  Watching TV 2  Sitting, inactive in a public place (e.g. a theatre or a meeting) 0  As a passenger in a car for an hour without a break 3  Lying down to rest in the afternoon when circumstances permit 3  Sitting and talking to someone 0  Sitting quietly after a lunch without alcohol 2  In a car, while stopped for a few minutes in traffic 0  Total score 10    PAST MEDICAL HISTORY :   has a past medical history of Allergy, Diabetes mellitus without complication (HCC), Diastasis recti (02/15/2022), Obesity affecting pregnancy, antepartum (05/30/2017), and Pre-existing diabetes mellitus in pregnancy (12/13/2016).  has a past surgical history that includes Foot surgery; nexplanon  removal (07/2016); Tubal ligation; Cesarean section (2010); and Cesarean section (N/A, 06/28/2017). Prior to Admission medications   Medication Sig Start Date End Date Taking? Authorizing Provider  Blood Glucose Monitoring Suppl DEVI 1 each by Does not apply route in the morning, at noon, and at bedtime. May substitute to any manufacturer covered by  patient's insurance. 01/12/23   CaudleThersia Bitters, FNP  Lancet Device MISC Use 1 each by fingerstick route in the morning, at noon, and at bedtime. May substitute to any manufacturer covered by patient's insurance. 01/12/23   Caudle, Thersia Bitters, FNP  metoprolol  tartrate (LOPRESSOR ) 25 MG tablet Take 1 tablet (25 mg total) by mouth 2 (two) times daily as needed (for heart rate over 110 bpm). 04/25/23   Caudle, Thersia Bitters, FNP  metroNIDAZOLE  (FLAGYL ) 500 MG tablet Take 1 tablet (500 mg total) by mouth 2 (two) times daily. 05/24/23   Gladis Elsie BROCKS, PA-C  Semaglutide , 2 MG/DOSE, (OZEMPIC , 2 MG/DOSE,) 8 MG/3ML SOPN Inject 2 mg into the skin once a week. 04/26/23   Caudle, Thersia Bitters, FNP  zolpidem  (AMBIEN ) 5 MG tablet Take 1 tablet (5 mg total) by mouth at bedtime as needed for sleep. Patient not taking: Reported on 04/25/2023 03/29/22   Copland, Alicia B, PA-C   Allergies  Allergen Reactions   Fish Allergy Anaphylaxis   Peanut-Containing Drug Products Anaphylaxis   Shellfish Allergy Anaphylaxis   Sulfa Antibiotics Nausea And Vomiting    FAMILY HISTORY:  family history includes Arthritis in her maternal grandfather and mother; Diabetes in her father; Stroke in her father. SOCIAL HISTORY:  reports that she has never smoked. She has never used smokeless tobacco. She reports that she does not drink alcohol and does not use drugs.   Review of Systems:  Gen:  Denies  fever, sweats, chills weight loss  HEENT: Denies blurred vision, double vision, ear pain, eye pain,  hearing loss, nose bleeds, sore throat Cardiac:  No dizziness, chest pain or heaviness, chest tightness,edema, No JVD Resp:   No cough, -sputum production, -shortness of breath,-wheezing, -hemoptysis,  Gi: Denies swallowing difficulty, stomach pain, nausea or vomiting, diarrhea, constipation, bowel incontinence Gu:  Denies bladder incontinence, burning urine Ext:   Denies Joint pain, stiffness or swelling Skin: Denies  skin  rash, easy bruising or bleeding or hives Endoc:  Denies polyuria, polydipsia , polyphagia or weight change Psych:   Denies depression, insomnia or hallucinations  Other:  All other systems negative  VITAL SIGNS: BP (!) 134/102 (BP Location: Right Arm, Patient Position: Sitting, Cuff Size: Normal)   Pulse (!) 115   Temp 98 F (36.7 C) (Oral)   Ht 5' 5 (1.651 m)   Wt 185 lb (83.9 kg)   SpO2 99%   BMI 30.79 kg/m    Physical Examination:   General Appearance: No distress  EYES PERRLA, EOM intact.   NECK Supple, No JVD Pulmonary: normal breath sounds, No wheezing.  CardiovascularNormal S1,S2.  No m/r/g.   Abdomen: Benign, Soft, non-tender. Skin:   warm, no rashes, no ecchymosis  Extremities: normal, no cyanosis, clubbing. Neuro:without focal findings,  speech normal  PSYCHIATRIC: Mood, affect within normal limits.   ASSESSMENT AND PLAN  OSA I suspect that OSA is likely present due to clinical presentation. Discussed the consequences of untreated sleep apnea. Advised not to drive drowsy for safety of patient and others. Will complete further evaluation with a home sleep study and follow up to review results.    DMII Stable, on current management. Following with PCP.   Shift work sleep disorder Counseled patient on keeping a regular sleep schedule on all days.   MEDICATION ADJUSTMENTS/LABS AND TESTS ORDERED: Recommend Sleep Study   Patient  satisfied with Plan of action and management. All questions answered  Follow up to review HST results and treatment plan.   I spent a total of 51 minutes reviewing chart data, face-to-face evaluation with the patient, counseling and coordination of care as detailed above.    Mahkai Fangman, M.D.  Sleep Medicine [facility]

## 2023-08-20 ENCOUNTER — Other Ambulatory Visit (HOSPITAL_BASED_OUTPATIENT_CLINIC_OR_DEPARTMENT_OTHER): Payer: Self-pay | Admitting: Family Medicine

## 2023-08-20 ENCOUNTER — Encounter

## 2023-08-20 DIAGNOSIS — E1165 Type 2 diabetes mellitus with hyperglycemia: Secondary | ICD-10-CM

## 2023-08-20 DIAGNOSIS — G4733 Obstructive sleep apnea (adult) (pediatric): Secondary | ICD-10-CM

## 2023-08-25 ENCOUNTER — Ambulatory Visit (HOSPITAL_BASED_OUTPATIENT_CLINIC_OR_DEPARTMENT_OTHER): Admitting: Family Medicine

## 2023-08-26 ENCOUNTER — Encounter (HOSPITAL_BASED_OUTPATIENT_CLINIC_OR_DEPARTMENT_OTHER): Payer: Self-pay | Admitting: *Deleted

## 2023-08-26 ENCOUNTER — Encounter (HOSPITAL_BASED_OUTPATIENT_CLINIC_OR_DEPARTMENT_OTHER): Payer: Self-pay | Admitting: Family Medicine

## 2023-08-26 ENCOUNTER — Ambulatory Visit (HOSPITAL_BASED_OUTPATIENT_CLINIC_OR_DEPARTMENT_OTHER): Admitting: Family Medicine

## 2023-08-26 VITALS — BP 118/89 | HR 94 | Ht 65.0 in | Wt 185.5 lb

## 2023-08-26 DIAGNOSIS — R202 Paresthesia of skin: Secondary | ICD-10-CM

## 2023-08-26 DIAGNOSIS — E781 Pure hyperglyceridemia: Secondary | ICD-10-CM

## 2023-08-26 DIAGNOSIS — R200 Anesthesia of skin: Secondary | ICD-10-CM | POA: Diagnosis not present

## 2023-08-26 DIAGNOSIS — Z7985 Long-term (current) use of injectable non-insulin antidiabetic drugs: Secondary | ICD-10-CM

## 2023-08-26 DIAGNOSIS — E119 Type 2 diabetes mellitus without complications: Secondary | ICD-10-CM

## 2023-08-26 NOTE — Addendum Note (Signed)
 Addended by: RONNETTE DAMIEN SQUIBB on: 08/26/2023 12:05 PM   Modules accepted: Orders

## 2023-08-26 NOTE — Progress Notes (Signed)
 Subjective:   Cheryl Carpenter [birth year] 08/26/2023  Chief Complaint  Patient presents with   Medical Management of Chronic Issues    29-month follow up; patient states at times she will have numbness and tingling in both fingers and in her feet.    HPI: Cheryl Carpenter presents today for re-assessment and management of chronic medical conditions.  DIABETES MELLITUS: Cheryl Carpenter presents for the medical management of diabetes.  Current diabetes medication regimen: Ozempic  2mg  subcutaneous weekly  Patient is adhering to a diabetic diet. She reports decreased appetite while taking Ozempic . Consumes 80oz water daily.  Patient is does exercising regularly.  Patient is checking BS regularly. Avg: 130s Patient is checking their feet regularly.  Denies polydipsia, polyphagia, polyuria, open wounds or ulcers on feet.  Lab Results  Component Value Date   HGBA1C 7.7 (H) 04/25/2023    Foot Exam: 01/12/2023 Lab Results  Component Value Date   LABMICR 6.2 01/12/2023   MICROALBUR 0.3 10/09/2019    Wt Readings from Last 3 Encounters:  08/26/23 84.1 kg  08/10/23 83.9 kg  04/25/23 87.5 kg   EXTREMITY COMPLAINT:  Pt reports numbness & tingling in bilateral hands and feet upon awakening over the last 3-30mo. She reports having to shake my hands and feet awake and describes the sensation as if her extremities are asleep. The numbness & tingling subsides over 1-2 minutes and does not return throughout the day.   The following portions of the patient's history were reviewed and updated as appropriate: past medical history, past surgical history, family history, social history, allergies, medications, and problem list.   Patient Active Problem List   Diagnosis Date Noted   Daytime somnolence 04/25/2023   Sinus tachycardia 04/25/2023   Hypertriglyceridemia 01/13/2023   Insomnia 04/28/2021   Class 1 obesity without serious comorbidity with body mass index (BMI) of 32.0 to 32.9 in  adult 10/23/2019   Annual physical exam 10/09/2019   Menorrhagia with regular cycle 12/06/2018   Diabetes mellitus, type 2 (HCC) 11/10/2016   Allergic rhinitis 03/21/2010   Past Medical History:  Diagnosis Date   Allergy    Diabetes mellitus without complication (HCC)    Diastasis recti 02/15/2022   Obesity affecting pregnancy, antepartum 05/30/2017   Pre-existing diabetes mellitus in pregnancy 12/13/2016   Current Diabetic Medications:  Metformin    Galerius.Gant ] Aspirin 81 mg daily after 12 weeks; discontinue after 36 weeks (= A2/B GDM)  - metformin  BID     For A2/B GDM or higher classes of DM  [X]  Diabetes Education and Testing Supplies  [ ]  Nutrition Counsult  [x]  Fetal ECHO after 22-24 weeks   [ ]  Eye exam for retina evaluation   [ ]  Baseline EKG  [X]  US  fetal growth every 4 weeks starting at 28 weeks       Past Surgical History:  Procedure Laterality Date   CESAREAN SECTION  2010   failed VAVD   CESAREAN SECTION N/A 06/28/2017   Procedure: CESAREAN SECTION, BTL;  Surgeon: Lake Read, MD;  Location: [facility] ORS;  Service: Obstetrics;  Laterality: N/A;   FOOT SURGERY     nexplanon  removal  07/2016   TUBAL LIGATION     Family History  Problem Relation Age of Onset   Diabetes Father    Stroke Father    Arthritis Mother    Arthritis Maternal Grandfather    Outpatient Medications Prior to Visit  Medication Sig Dispense Refill   Blood Glucose Monitoring Suppl DEVI  1 each by Does not apply route in the morning, at noon, and at bedtime. May substitute to any manufacturer covered by patient's insurance. 1 each 0   Lancet Device MISC Use 1 each by fingerstick route in the morning, at noon, and at bedtime. May substitute to any manufacturer covered by patient's insurance. 1 each 0   metoprolol  tartrate (LOPRESSOR ) 25 MG tablet Take 1 tablet (25 mg total) by mouth 2 (two) times daily as needed (for heart rate over 110 bpm). 180 tablet 3   OZEMPIC , 2 MG/DOSE, 8 MG/3ML SOPN Inject 2 mg into the  skin once a week. 3 mL 3   zolpidem  (AMBIEN ) 5 MG tablet Take 1 tablet (5 mg total) by mouth at bedtime as needed for sleep. (Patient not taking: Reported on 08/10/2023) 30 tablet 0   No facility-administered medications prior to visit.   Allergies  Allergen Reactions   Fish Allergy Anaphylaxis   Peanut-Containing Drug Products Anaphylaxis   Shellfish Allergy Anaphylaxis   Sulfa Antibiotics Nausea And Vomiting     ROS: A complete ROS was performed with pertinent positives/negatives noted in the HPI. The remainder of the ROS are negative.    Objective:   Today's Vitals   08/26/23 0818  BP: 118/89  Pulse: 94  SpO2: 100%  Weight: 84.1 kg  Height: 5' 5 (1.651 m)   GENERAL: Well-appearing, in NAD. Well nourished.  SKIN: Pink, warm and dry. No rash, lesion, ulceration, or ecchymoses.  Head: Normocephalic. NECK: Trachea midline. Full ROM.  THROAT: Mucous membranes pink and moist.  RESPIRATORY: Chest wall symmetrical. Respirations even and non-labored.  CARDIAC: Peripheral pulses 2+ bilaterally.  MSK: Muscle tone and strength appropriate for age. Joints w/o tenderness, redness, or swelling.  EXTREMITIES: Without clubbing, cyanosis, or edema.  NEUROLOGIC: No motor or sensory deficits. Steady, even gait. C2-C12 intact.  PSYCH/MENTAL STATUS: Alert, oriented x 3. Cooperative, appropriate mood and affect.   Health Maintenance Due  Topic Date Due   HPV VACCINES (2 - 3-dose series) 09/23/2006   Hepatitis B Vaccines (1 of 3 - 19+ 3-dose series) Never done   COVID-19 Vaccine (5 - 2024-25 season) 09/26/2022   OPHTHALMOLOGY EXAM  08/26/2023    No results found for any visits on 08/26/23.  The ASCVD Risk score (Arnett DK, et al., 2019) failed to calculate for the following reasons:   The 2019 ASCVD risk score is only valid for ages 56 to 84     Assessment & Plan:  1. Type 2 diabetes mellitus without complication, without long-term current use of insulin  (HCC) (Primary) Controlled.  Pt encouraged to continue healthy lifestyle, exercise, low carb high protein diet.  - Hemoglobin A1c - Lipid panel  2. Numbness and tingling in both hands 3. Numbness and tingling of both feet Likely related to nerve compression while sleeping. Discussed trying a new pillow, inserts, and/or a new mattress. Pt agreed. Will check BMP to r/o hypokalemia.  - Basic metabolic panel with GFR  4. Hypertriglyceridemia - Lipid panel  Type 2 diabetes mellitus without complication, without long-term current use of insulin  (HCC) -     Hemoglobin A1c -     Lipid panel  Numbness and tingling in both hands  Numbness and tingling of both feet -     Basic metabolic panel with GFR  Hypertriglyceridemia -     Lipid panel    No orders of the defined types were placed in this encounter.  Lab Orders  Basic metabolic panel with GFR         Hemoglobin A1c         Lipid panel     No images are attached to the encounter or orders placed in the encounter.  Return in about 6 months (around 02/26/2024) for Diabetes follow-up .    Patient to reach out to office if new, worrisome, or unresolved symptoms arise or if no improvement in patient's condition. Patient verbalized understanding and is agreeable to treatment plan. All questions answered to patient's satisfaction.   Treatment plan and recommendation(s) reviewed by supervising preceptor, Thersia CLEMENTEEN Stark, FNP-C, prior to clinic discharge.   Rosina Ada, BSN, RN  DNP Student

## 2023-08-26 NOTE — Progress Notes (Signed)
 Subjective:   Cheryl Carpenter 10-12-1990 08/26/2023  Chief Complaint  Patient presents with   Medical Management of Chronic Issues    83-month follow up; patient states at times she will have numbness and tingling in both fingers and in her feet.    HPI: Cheryl Carpenter presents today for re-assessment and management of chronic medical conditions.  DIABETES MELLITUS: Cheryl Carpenter presents for the medical management of diabetes.  Current diabetes medication regimen: Ozempic  2mg  subcutaneous weekly  Patient is adhering to a diabetic diet. She reports decreased appetite while taking Ozempic . Consumes 80oz water daily.  Patient is does exercising regularly.  Patient is checking BS regularly. Avg: 130s Patient is checking their feet regularly.  Denies polydipsia, polyphagia, polyuria, open wounds or ulcers on feet.  Lab Results  Component Value Date   HGBA1C 7.7 (H) 04/25/2023    Foot Exam: 01/12/2023 Lab Results  Component Value Date   LABMICR 6.2 01/12/2023   MICROALBUR 0.3 10/09/2019    Wt Readings from Last 3 Encounters:  08/26/23 185 lb 8 oz (84.1 kg)  08/10/23 185 lb (83.9 kg)  04/25/23 193 lb (87.5 kg)   EXTREMITY COMPLAINT:  Pt reports numbness & tingling in bilateral hands and feet upon awakening over the last 3-56mo. She reports having to shake my hands and feet awake and describes the sensation as if her extremities are asleep. The numbness & tingling subsides over 1-2 minutes and does not return throughout the day.   The following portions of the patient's history were reviewed and updated as appropriate: past medical history, past surgical history, family history, social history, allergies, medications, and problem list.   Patient Active Problem List   Diagnosis Date Noted   Daytime somnolence 04/25/2023   Sinus tachycardia 04/25/2023   Hypertriglyceridemia 01/13/2023   Insomnia 04/28/2021   Class 1 obesity without serious comorbidity with body mass  index (BMI) of 32.0 to 32.9 in adult 10/23/2019   Annual physical exam 10/09/2019   Menorrhagia with regular cycle 12/06/2018   Diabetes mellitus, type 2 (HCC) 11/10/2016   Allergic rhinitis 03/21/2010   Past Medical History:  Diagnosis Date   Allergy    Diabetes mellitus without complication (HCC)    Diastasis recti 02/15/2022   Obesity affecting pregnancy, antepartum 05/30/2017   Pre-existing diabetes mellitus in pregnancy 12/13/2016   Current Diabetic Medications:  Metformin    Galerius.Gant ] Aspirin 81 mg daily after 12 weeks; discontinue after 36 weeks (= A2/B GDM)  - metformin  BID     For A2/B GDM or higher classes of DM  [X]  Diabetes Education and Testing Supplies  [ ]  Nutrition Counsult  [x]  Fetal ECHO after 22-24 weeks   [ ]  Eye exam for retina evaluation   [ ]  Baseline EKG  [X]  US  fetal growth every 4 weeks starting at 28 weeks       Past Surgical History:  Procedure Laterality Date   CESAREAN SECTION  2010   failed VAVD   CESAREAN SECTION N/A 06/28/2017   Procedure: CESAREAN SECTION, BTL;  Surgeon: Lake Read, MD;  Location: [facility] ORS;  Service: Obstetrics;  Laterality: N/A;   FOOT SURGERY     nexplanon  removal  07/2016   TUBAL LIGATION     Family History  Problem Relation Age of Onset   Diabetes Father    Stroke Father    Arthritis Mother    Arthritis Maternal Grandfather    Outpatient Medications Prior to Visit  Medication Sig Dispense  Refill   Blood Glucose Monitoring Suppl DEVI 1 each by Does not apply route in the morning, at noon, and at bedtime. May substitute to any manufacturer covered by patient's insurance. 1 each 0   Lancet Device MISC Use 1 each by fingerstick route in the morning, at noon, and at bedtime. May substitute to any manufacturer covered by patient's insurance. 1 each 0   metoprolol  tartrate (LOPRESSOR ) 25 MG tablet Take 1 tablet (25 mg total) by mouth 2 (two) times daily as needed (for heart rate over 110 bpm). 180 tablet 3   OZEMPIC , 2 MG/DOSE, 8  MG/3ML SOPN Inject 2 mg into the skin once a week. 3 mL 3   zolpidem  (AMBIEN ) 5 MG tablet Take 1 tablet (5 mg total) by mouth at bedtime as needed for sleep. (Patient not taking: Reported on 08/10/2023) 30 tablet 0   No facility-administered medications prior to visit.   Allergies  Allergen Reactions   Fish Allergy Anaphylaxis   Peanut-Containing Drug Products Anaphylaxis   Shellfish Allergy Anaphylaxis   Sulfa Antibiotics Nausea And Vomiting     ROS: A complete ROS was performed with pertinent positives/negatives noted in the HPI. The remainder of the ROS are negative.    Objective:   Today's Vitals   08/26/23 0818  BP: 118/89  Pulse: 94  SpO2: 100%  Weight: 185 lb 8 oz (84.1 kg)  Height: 5' 5 (1.651 m)   GENERAL: Well-appearing, in NAD. Well nourished.  SKIN: Pink, warm and dry. No rash, lesion, ulceration, or ecchymoses.  Head: Normocephalic. NECK: Trachea midline. Full ROM.  THROAT: Mucous membranes pink and moist.  RESPIRATORY: Chest wall symmetrical. Respirations even and non-labored.  CARDIAC: Peripheral pulses 2+ bilaterally.  MSK: Muscle tone and strength appropriate for age. Joints w/o tenderness, redness, or swelling.  EXTREMITIES: Without clubbing, cyanosis, or edema.  NEUROLOGIC: No motor or sensory deficits. Steady, even gait. C2-C12 intact.  PSYCH/MENTAL STATUS: Alert, oriented x 3. Cooperative, appropriate mood and affect.   Health Maintenance Due  Topic Date Due   HPV VACCINES (2 - 3-dose series) 09/23/2006   Hepatitis B Vaccines (1 of 3 - 19+ 3-dose series) Never done   COVID-19 Vaccine (5 - 2024-25 season) 09/26/2022    Results for orders placed or performed in visit on 08/26/23  HM DIABETES EYE EXAM  Result Value Ref Range   HM Diabetic Eye Exam No Retinopathy No Retinopathy    The ASCVD Risk score (Arnett DK, et al., 2019) failed to calculate for the following reasons:   The 2019 ASCVD risk score is only valid for ages 22 to 60      Assessment & Plan:  1. Type 2 diabetes mellitus without complication, without long-term current use of insulin  (HCC) (Primary) Controlled. Pt encouraged to continue healthy lifestyle, exercise, low carb high protein diet.  - Hemoglobin A1c - Lipid panel  2. Numbness and tingling in both hands 3. Numbness and tingling of both feet Likely related to nerve compression while sleeping. No signs of diabetic neuropathy.  Discussed trying a new pillow, inserts, and/or a new mattress. Pt agreed. Will check BMP to r/o hypokalemia.  - Basic metabolic panel with GFR  4. Hypertriglyceridemia - Lipid panel   No orders of the defined types were placed in this encounter.  Lab Orders         Basic metabolic panel with GFR         Hemoglobin A1c         Lipid panel  No images are attached to the encounter or orders placed in the encounter.  Return in about 6 months (around 02/26/2024) for Diabetes follow-up .    Patient to reach out to office if new, worrisome, or unresolved symptoms arise or if no improvement in patient's condition. Patient verbalized understanding and is agreeable to treatment plan. All questions answered to patient's satisfaction.   Thersia Stark, FNP-C

## 2023-08-27 LAB — BASIC METABOLIC PANEL WITH GFR
BUN/Creatinine Ratio: 12 (ref 9–23)
BUN: 9 mg/dL (ref 6–20)
CO2: 21 mmol/L (ref 20–29)
Calcium: 9 mg/dL (ref 8.7–10.2)
Chloride: 101 mmol/L (ref 96–106)
Creatinine, Ser: 0.76 mg/dL (ref 0.57–1.00)
Glucose: 102 mg/dL — ABNORMAL HIGH (ref 70–99)
Potassium: 4 mmol/L (ref 3.5–5.2)
Sodium: 137 mmol/L (ref 134–144)
eGFR: 105 mL/min/1.73 (ref >59)

## 2023-08-27 LAB — HEMOGLOBIN A1C
Est. average glucose Bld gHb Est-mCnc: 103 mg/dL
Hgb A1c MFr Bld: 5.2 % (ref 4.8–5.6)

## 2023-08-29 ENCOUNTER — Other Ambulatory Visit (HOSPITAL_BASED_OUTPATIENT_CLINIC_OR_DEPARTMENT_OTHER): Payer: Self-pay | Admitting: Family Medicine

## 2023-08-29 ENCOUNTER — Ambulatory Visit (HOSPITAL_BASED_OUTPATIENT_CLINIC_OR_DEPARTMENT_OTHER): Payer: Self-pay | Admitting: Family Medicine

## 2023-08-29 MED ORDER — OZEMPIC (1 MG/DOSE) 4 MG/3ML ~~LOC~~ SOPN: 1.0000 mg | PEN_INJECTOR | SUBCUTANEOUS | 3 refills | Status: DC

## 2023-08-29 MED ORDER — NYSTATIN 100000 UNIT/GM EX POWD: 1.0000 | Freq: Three times a day (TID) | CUTANEOUS | 0 refills | Status: AC | PRN

## 2023-08-29 NOTE — Progress Notes (Signed)
 Hi [place],  Your electrolytes are normal. Kidney function is stable. Your A1C is great at 5.2! This is well under the goal of 7.0 for diabetes control. If you would like to wean down on your dosage of Ozempic , we could slowly decrease this and try to start with diet control of your diabetes. Let me know.

## 2023-08-31 ENCOUNTER — Encounter (HOSPITAL_BASED_OUTPATIENT_CLINIC_OR_DEPARTMENT_OTHER): Payer: Self-pay | Admitting: Family Medicine

## 2023-08-31 ENCOUNTER — Ambulatory Visit: Payer: Self-pay | Admitting: Sleep Medicine

## 2023-08-31 DIAGNOSIS — R069 Unspecified abnormalities of breathing: Secondary | ICD-10-CM | POA: Diagnosis not present

## 2023-08-31 DIAGNOSIS — G4733 Obstructive sleep apnea (adult) (pediatric): Secondary | ICD-10-CM

## 2023-08-31 DIAGNOSIS — G4726 Circadian rhythm sleep disorder, shift work type: Secondary | ICD-10-CM

## 2023-09-01 NOTE — Telephone Encounter (Signed)
 Please see mychart message sent by pt and advise.

## 2023-09-02 ENCOUNTER — Telehealth (HOSPITAL_BASED_OUTPATIENT_CLINIC_OR_DEPARTMENT_OTHER): Payer: Self-pay | Admitting: *Deleted

## 2023-09-02 ENCOUNTER — Telehealth: Payer: Self-pay | Admitting: Sleep Medicine

## 2023-09-02 DIAGNOSIS — G4726 Circadian rhythm sleep disorder, shift work type: Secondary | ICD-10-CM

## 2023-09-02 DIAGNOSIS — G4733 Obstructive sleep apnea (adult) (pediatric): Secondary | ICD-10-CM

## 2023-09-02 NOTE — Telephone Encounter (Signed)
-----   Message from Thersia Bitters Caudle sent at 09/02/2023  8:21 AM EDT ----- Can we check with [facility] why the lipid panel was not run?  Her BMP and A1c were completed ----- Message ----- From: SYSTEM Sent: 08/31/2023  12:19 AM EDT To: Thersia Bitters Stark, FNP

## 2023-09-02 NOTE — Telephone Encounter (Signed)
 Requisition was printed after pt's office visit for pt to take with her due to pt not fasting at the time of visit and pt was told to go by [facility] near where she lived when she was fasting to have lipid labwork done. Mychart sent to pt checking to see if she had had this done yet and told pt if she had not had it done that we needed her to get it checked.

## 2023-09-02 NOTE — Telephone Encounter (Signed)
 Cpap order was sent to [facility]. Stacy with [facility] sent in a message see below [place], [place]   pt has a [employer] which follows Medicare guidelines using the 4% destat criteria. Pt 4% AHI is 3.1 and does not qualify her. Even though her Centrals are high her AHI needs to be high enough to qualify.  Thank You

## 2023-10-07 ENCOUNTER — Telehealth: Admitting: Physician Assistant

## 2023-10-07 DIAGNOSIS — B354 Tinea corporis: Secondary | ICD-10-CM | POA: Diagnosis not present

## 2023-10-07 MED ORDER — TERBINAFINE HCL 250 MG PO TABS: 250.0000 mg | ORAL_TABLET | Freq: Every day | ORAL | 0 refills | Status: AC

## 2023-10-07 MED ORDER — CLOTRIMAZOLE 1 % EX CREA: 1.0000 | TOPICAL_CREAM | Freq: Two times a day (BID) | CUTANEOUS | 0 refills | Status: AC

## 2023-10-07 NOTE — Progress Notes (Signed)
 E-Visit for Ringworm  We are sorry that you are not feeling well. Here is how we plan to help!  Based on what you shared with me it looks like you have tinea corporis, or "Ringworm".  The symptoms of Ringworm include red, peeling, itchy rash that can appear in a ring shape.  This fungal infection can be spread through shared towels, clothing, bedding, or hard surfaces (particularly in moist areas) such as shower stalls, locker room floors, or pool area that has the fungus present. If you have a fungal infection on one part of your body, you can also spread it to other parts. For instance, men with a fungal infection on their feet sometimes spread it to their groin.  I am recommending:Clotrimazole  1% cream or gel, apply to area twice per day  Prescription medications are only indicated for an extensive rash or if over the counter treatments have failed.  I am prescribing:Terbinafine  250 mg once per day for one week  HOME CARE:  Keep affected area clean, dry, and cool. Wash with soap and shampoo after sports or exercise and dry yourself well after bathing or swimming Wear cotton underwear and change them if they become damp or sweaty. Avoid using swimming pools, public showers, or baths.  GET HELP RIGHT AWAY IF:  Symptoms that don't away after treatment. Severe itching that persists. If your rash spreads or swells. If your rash begins to have drainage or smell. You develop a fever.  MAKE SURE YOU   Understand these instructions. Will watch your condition. Will get help right away if you are not doing well or get worse.  Thank you for choosing an e-visit.  Your e-visit answers were reviewed by a board certified advanced clinical practitioner to complete your personal care plan. Depending upon the condition, your plan could have included both over the counter or prescription medications.  Please review your pharmacy choice. Make sure the pharmacy is open so you can pick up  prescription now. If there is a problem, you may contact your provider through [employer] and have the prescription routed to another pharmacy.  Your safety is important to us . If you have drug allergies check your prescription carefully.   For the next 24 hours you can use MyChart to ask questions about today's visit, request a non-urgent call back, or ask for a work or school excuse. You will get an email in the next two days asking about your experience. I hope that your e-visit has been valuable and will speed your recovery.   References or for more information:  [email or web address]=[email or web address]&source=search_result&selectedTitle=3~52&usage_type=default&display_rank=3     I have spent 5 minutes in review of e-visit questionnaire, review and updating patient chart, medical decision making and response to patient.   Delon CHRISTELLA Dickinson, PA-C

## 2023-10-10 ENCOUNTER — Encounter (HOSPITAL_BASED_OUTPATIENT_CLINIC_OR_DEPARTMENT_OTHER): Payer: Self-pay | Admitting: Family Medicine

## 2023-10-17 ENCOUNTER — Telehealth: Admitting: Family Medicine

## 2023-10-17 DIAGNOSIS — M549 Dorsalgia, unspecified: Secondary | ICD-10-CM

## 2023-10-17 MED ORDER — NAPROXEN 500 MG PO TABS: 500.0000 mg | ORAL_TABLET | Freq: Two times a day (BID) | ORAL | 0 refills | Status: AC

## 2023-10-17 MED ORDER — BACLOFEN 10 MG PO TABS: 10.0000 mg | ORAL_TABLET | Freq: Three times a day (TID) | ORAL | 0 refills | Status: AC

## 2023-10-17 NOTE — Progress Notes (Signed)
 E-Visit for Back Pain   We are sorry that you are not feeling well.  Here is how we plan to help!  Based on what you have shared with me it looks like you mostly have acute back pain.  Acute back pain is defined as musculoskeletal pain that can resolve in 1-3 weeks with conservative treatment.  I have prescribed Naprosyn 500 mg take one by mouth twice a day non-steroid anti-inflammatory (NSAID) as well as Baclofen 10 mg every eight hours as needed which is a muscle relaxer  Some patients experience stomach irritation or in increased heartburn with anti-inflammatory drugs.  Please keep in mind that muscle relaxer's can cause fatigue and should not be taken while at work or driving.  Back pain is very common.  The pain often gets better over time.  The cause of back pain is usually not dangerous.  Most people can learn to manage their back pain on their own.  Home Care Stay active.  Start with short walks on flat ground if you can.  Try to walk farther each day. Do not sit, drive or stand in one place for more than 30 minutes.  Do not stay in bed. Do not avoid exercise or work.  Activity can help your back heal faster. Be careful when you bend or lift an object.  Bend at your knees, keep the object close to you, and do not twist. Sleep on a firm mattress.  Lie on your side, and bend your knees.  If you lie on your back, put a pillow under your knees. Only take medicines as told by your doctor. Put ice on the injured area. Put ice in a plastic bag Place a towel between your skin and the bag Leave the ice on for 15-20 minutes, 3-4 times a day for the first 2-3 days. 210 After that, you can switch between ice and heat packs. Ask your doctor about back exercises or massage. Avoid feeling anxious or stressed.  Find good ways to deal with stress, such as exercise.  Get Help Right Way If: Your pain does not go away with rest or medicine. Your pain does not go away in 1 week. You have new  problems. You do not feel well. The pain spreads into your legs. You cannot control when you poop (bowel movement) or pee (urinate) You feel sick to your stomach (nauseous) or throw up (vomit) You have belly (abdominal) pain. You feel like you may pass out (faint). If you develop a fever.  Make Sure you: Understand these instructions. Will watch your condition Will get help right away if you are not doing well or get worse.  Your e-visit answers were reviewed by a board certified advanced clinical practitioner to complete your personal care plan.  Depending on the condition, your plan could have included both over the counter or prescription medications.  If there is a problem please reply  once you have received a response from your provider.  Your safety is important to [place].  If you have drug allergies check your prescription carefully.    You can use MyChart to ask questions about today's visit, request a non-urgent call back, or ask for a work or school excuse for 24 hours related to this e-Visit. If it has been greater than 24 hours you will need to follow up with your provider, or enter a new e-Visit to address those concerns.  You will get an e-mail in the next two days asking about  your experience.  I hope that your e-visit has been valuable and will speed your recovery. Thank you for using e-visits.   have provided 5 minutes of non face to face time during this encounter for chart review and documentation.

## 2023-11-07 ENCOUNTER — Encounter (HOSPITAL_BASED_OUTPATIENT_CLINIC_OR_DEPARTMENT_OTHER): Payer: Self-pay | Admitting: Family Medicine

## 2023-11-07 ENCOUNTER — Other Ambulatory Visit (HOSPITAL_BASED_OUTPATIENT_CLINIC_OR_DEPARTMENT_OTHER): Payer: Self-pay | Admitting: Family Medicine

## 2023-11-07 MED ORDER — OZEMPIC (2 MG/DOSE) 8 MG/3ML ~~LOC~~ SOPN: 2.0000 mg | PEN_INJECTOR | SUBCUTANEOUS | 2 refills | Status: AC

## 2023-11-07 NOTE — Telephone Encounter (Signed)
 Please see mychart message sent by pt and advise.

## 2023-11-09 ENCOUNTER — Other Ambulatory Visit (HOSPITAL_BASED_OUTPATIENT_CLINIC_OR_DEPARTMENT_OTHER): Payer: Self-pay | Admitting: Family Medicine

## 2023-11-11 LAB — LIPID PANEL WITH LDL/HDL RATIO
Cholesterol, Total: 103 mg/dL (ref 100–199)
HDL: 20 mg/dL — ABNORMAL LOW (ref >39)
LDL Chol Calc (NIH): 37 mg/dL (ref 0–99)
LDL/HDL Ratio: 1.9 ratio (ref 0.0–3.2)
Triglycerides: 303 mg/dL — ABNORMAL HIGH (ref 0–149)
VLDL Cholesterol Cal: 46 mg/dL — ABNORMAL HIGH (ref 5–40)

## 2023-11-15 ENCOUNTER — Telehealth: Payer: Self-pay | Admitting: Sleep Medicine

## 2023-11-15 ENCOUNTER — Ambulatory Visit (HOSPITAL_BASED_OUTPATIENT_CLINIC_OR_DEPARTMENT_OTHER): Payer: Self-pay | Admitting: Family Medicine

## 2023-11-15 NOTE — Progress Notes (Signed)
 Hi Cheryl Carpenter, Your triglycerides, HDL (good cholesterol) and VLDL) bad cholesterol) have improved since check 6 months ago.  Your triglycerides goal is less than 150.  The goal of your HDL is to get this over 40.  And the goal of your VLDL is to be less than 40.  At this time, are you currently taking any medications or supplements such as fish oil, garlic?  We may recommend starting these since your A1c is now under control to get your triglycerides decreased and within normal range.  Let me know.

## 2023-11-15 NOTE — Telephone Encounter (Signed)
 Patient was scheduled to do in lab sleep study on 10/24/23. We received a note from [facility] that the patient has not returned their calls to rescheduled the sleep study. They will not make further attempts to schedule this patient

## 2024-01-06 ENCOUNTER — Other Ambulatory Visit (HOSPITAL_COMMUNITY): Payer: Self-pay

## 2024-02-06 ENCOUNTER — Encounter (HOSPITAL_BASED_OUTPATIENT_CLINIC_OR_DEPARTMENT_OTHER): Payer: Self-pay | Admitting: Family Medicine

## 2024-02-08 NOTE — Telephone Encounter (Signed)
 Please see mychart message sent by pt and advise.

## 2024-02-26 ENCOUNTER — Encounter (HOSPITAL_BASED_OUTPATIENT_CLINIC_OR_DEPARTMENT_OTHER): Payer: Self-pay | Admitting: *Deleted

## 2024-02-27 ENCOUNTER — Ambulatory Visit (HOSPITAL_BASED_OUTPATIENT_CLINIC_OR_DEPARTMENT_OTHER): Admitting: Family Medicine

## 2024-02-27 ENCOUNTER — Telehealth (INDEPENDENT_AMBULATORY_CARE_PROVIDER_SITE_OTHER): Admitting: Family Medicine

## 2024-02-27 ENCOUNTER — Encounter (HOSPITAL_BASED_OUTPATIENT_CLINIC_OR_DEPARTMENT_OTHER): Payer: Self-pay | Admitting: Family Medicine

## 2024-02-27 VITALS — Ht 65.0 in | Wt 185.0 lb

## 2024-02-27 DIAGNOSIS — E781 Pure hyperglyceridemia: Secondary | ICD-10-CM

## 2024-02-27 DIAGNOSIS — Z7985 Long-term (current) use of injectable non-insulin antidiabetic drugs: Secondary | ICD-10-CM | POA: Diagnosis not present

## 2024-02-27 DIAGNOSIS — E119 Type 2 diabetes mellitus without complications: Secondary | ICD-10-CM | POA: Diagnosis not present

## 2024-02-27 NOTE — Patient Instructions (Signed)
 Please return for fasting blood work.  For fasting, if your blood work is in the morning please do not eat any food after midnight.  You may have water or black coffee prior to your lab work.  Please take all regularly prescribed medications even if you are fasting.  If your blood work is in the afternoon, please fast for at least 5 to 6 hours.  You may continue to drink water and/or black coffee prior to your lab work.  Please take all scheduled medications even if you are fasting.

## 2024-02-28 ENCOUNTER — Ambulatory Visit (INDEPENDENT_AMBULATORY_CARE_PROVIDER_SITE_OTHER)

## 2024-02-28 ENCOUNTER — Ambulatory Visit
Admission: RE | Admit: 2024-02-28 | Discharge: 2024-02-28 | Disposition: A | Discharge status: Home / Self Care | Source: Home / Self Care

## 2024-02-28 ENCOUNTER — Telehealth (HOSPITAL_BASED_OUTPATIENT_CLINIC_OR_DEPARTMENT_OTHER): Payer: Self-pay | Admitting: *Deleted

## 2024-02-28 VITALS — BP 132/83 | HR 91 | Temp 98.2°F | Resp 18

## 2024-02-28 DIAGNOSIS — W19XXXA Unspecified fall, initial encounter: Secondary | ICD-10-CM | POA: Diagnosis not present

## 2024-02-28 DIAGNOSIS — M25511 Pain in right shoulder: Secondary | ICD-10-CM

## 2024-02-28 MED ORDER — TIZANIDINE HCL 4 MG PO TABS: 4.0000 mg | ORAL_TABLET | Freq: Three times a day (TID) | ORAL | 0 refills | Status: AC | PRN

## 2024-02-28 NOTE — ED Provider Notes (Signed)
 " [facility]    CSN: [id number] Arrival date & time: 02/28/24  9060      History   Chief Complaint Chief Complaint  Patient presents with   Shoulder Pain    Clemens in snow/ice a few days ago & now having extreme shoulder pain. - Entered by patient    HPI Cheryl Carpenter is a 34 y.o. female.   Patient presenting today with 2-day history of right shoulder pain, stiffness, soreness following a fall on the ice 2 days ago.  States she caught herself with his arm.  Denies bruising, swelling, weakness, numbness, tingling, loss of range of motion, head injury.  So far trying meloxicam  with mild temporary benefit.   Past Medical History:  Diagnosis Date   Allergy    Diabetes mellitus without complication (HCC)    Diastasis recti 02/15/2022   Obesity affecting pregnancy, antepartum 05/30/2017   Pre-existing diabetes mellitus in pregnancy 12/13/2016   Current Diabetic Medications:  Metformin    GALERIUS.GANT ] Aspirin 81 mg daily after 12 weeks; discontinue after 36 weeks (= A2/B GDM)  - metformin  BID     For A2/B GDM or higher classes of DM  [X]  Diabetes Education and Testing Supplies  [ ]  Nutrition Counsult  [x]  Fetal ECHO after 22-24 weeks   [ ]  Eye exam for retina evaluation   [ ]  Baseline EKG  [X]  US  fetal growth every 4 weeks starting at 28 weeks        Patient Active Problem List   Diagnosis Date Noted   Daytime somnolence 04/25/2023   Sinus tachycardia 04/25/2023   Hypertriglyceridemia 01/13/2023   Insomnia 04/28/2021   Class 1 obesity without serious comorbidity with body mass index (BMI) of 32.0 to 32.9 in adult 10/23/2019   Annual physical exam 10/09/2019   Menorrhagia with regular cycle 12/06/2018   Diabetes mellitus, type 2 (HCC) 11/10/2016   Allergic rhinitis 03/21/2010    Past Surgical History:  Procedure Laterality Date   CESAREAN SECTION  2010   failed VAVD   CESAREAN SECTION N/A 06/28/2017   Procedure: CESAREAN SECTION, BTL;  Surgeon: Lake Read, MD;   Location: [facility]S;  Service: Obstetrics;  Laterality: N/A;   FOOT SURGERY     nexplanon  removal  07/2016   TUBAL LIGATION      OB History     Gravida  2   Para  2   Term  2   Preterm      AB      Living  2      SAB      IAB      Ectopic      Multiple  0   Live Births  2            Home Medications    Prior to Admission medications  Medication Sig Start Date End Date Taking? Authorizing Provider  tiZANidine  (ZANAFLEX ) 4 MG tablet Take 1 tablet (4 mg total) by mouth every 8 (eight) hours as needed for muscle spasms. Do not drink alcohol or drive while taking this medication.  May cause drowsiness. 02/28/24  Yes Stuart Vernell Norris, PA-C  baclofen  (LIORESAL ) 10 MG tablet Take 1 tablet (10 mg total) by mouth 3 (three) times daily. 10/17/23   Blair, Diane W, FNP  Blood Glucose Monitoring Suppl DEVI 1 each by Does not apply route in the morning, at noon, and at bedtime. May substitute to any manufacturer covered by patient's insurance. 01/12/23   Knute Rouse  Olivia, FNP  clotrimazole  (LOTRIMIN ) 1 % cream Apply 1 Application topically 2 (two) times daily. 10/07/23   Vivienne Delon HERO, PA-C  Lancet Device MISC Use 1 each by fingerstick route in the morning, at noon, and at bedtime. May substitute to any manufacturer covered by patient's insurance. 01/12/23   Caudle, Thersia Bitters, FNP  meloxicam  (MOBIC ) 15 MG tablet Take 1 tablet every day by oral route with meal(s). 02/03/24   [provider]  metoprolol  tartrate (LOPRESSOR ) 25 MG tablet Take 1 tablet (25 mg total) by mouth 2 (two) times daily as needed (for heart rate over 110 bpm). 04/25/23   Caudle, Thersia Bitters, FNP  naproxen  (NAPROSYN ) 500 MG tablet Take 1 tablet (500 mg total) by mouth 2 (two) times daily with a meal. 10/17/23   Almeda Loa ORN, FNP  nystatin  (MYCOSTATIN /NYSTOP ) powder Apply 1 Application topically 3 (three) times daily as needed (yeast rash). 08/29/23   Caudle, Thersia Bitters, FNP   Semaglutide , 2 MG/DOSE, (OZEMPIC , 2 MG/DOSE,) 8 MG/3ML SOPN Inject 2 mg into the skin once a week. 11/07/23   Caudle, Thersia Bitters, FNP  terbinafine  (LAMISIL ) 250 MG tablet Take 1 tablet (250 mg total) by mouth daily. 10/07/23   Vivienne Delon HERO, PA-C    Family History Family History  Problem Relation Age of Onset   Diabetes Father    Stroke Father    Arthritis Mother    Arthritis Maternal Grandfather     Social History Social History[1]   Allergies   Fish allergy, Peanut-containing drug products, Shellfish allergy, and Sulfa antibiotics   Review of Systems Review of Systems Per HPI  Physical Exam Triage Vital Signs ED Triage Vitals  Encounter Vitals Group     BP 02/28/24 0955 132/83     Girls Systolic BP Percentile --      Girls Diastolic BP Percentile --      Boys Systolic BP Percentile --      Boys Diastolic BP Percentile --      Pulse Rate 02/28/24 0955 91     Resp 02/28/24 0955 18     Temp 02/28/24 0955 98.2 F (36.8 C)     Temp Source 02/28/24 0955 Oral     SpO2 02/28/24 0955 97 %     Weight --      Height --      Head Circumference --      Peak Flow --      Pain Score 02/28/24 0957 8     Pain Loc --      Pain Education --      Exclude from Growth Chart --    No data found.  Updated Vital Signs BP 132/83 (BP Location: Right Arm)   Pulse 91   Temp 98.2 F (36.8 C) (Oral)   Resp 18   SpO2 97%   Visual Acuity Right Eye Distance:   Left Eye Distance:   Bilateral Distance:    Right Eye Near:   Left Eye Near:    Bilateral Near:     Physical Exam Vitals and nursing note reviewed.  Constitutional:      Appearance: Normal appearance. She is not ill-appearing.  HENT:     Head: Atraumatic.  Eyes:     Extraocular Movements: Extraocular movements intact.     Conjunctiva/sclera: Conjunctivae normal.  Cardiovascular:     Rate and Rhythm: Normal rate.  Pulmonary:     Effort: Pulmonary effort is normal.  Musculoskeletal:  General:  Tenderness and signs of injury present. No swelling or deformity. Normal range of motion.     Cervical back: Normal range of motion and neck supple.     Comments: No midline spinal tenderness to palpation diffusely.  Tenderness to palpation to the right shoulder musculature diffusely.  No bony deformity palpable, range of motion to the right upper extremity intact, grip strength full and equal bilateral hands  Skin:    General: Skin is warm and dry.     Findings: No bruising or erythema.  Neurological:     Mental Status: She is alert and oriented to person, place, and time.     Comments: Right upper extremity neurovascularly intact  Psychiatric:        Mood and Affect: Mood normal.        Thought Content: Thought content normal.        Judgment: Judgment normal.      UC Treatments / Results  Labs (all labs ordered are listed, but only abnormal results are displayed) Labs Reviewed - No data to display  EKG   Radiology DG Shoulder Right Result Date: 02/28/2024 EXAM: 1 VIEW(S) XRAY OF THE RIGHT SHOULDER 02/28/2024 10:07:55 AM COMPARISON: None available. CLINICAL HISTORY: Clemens on the ice 2 days ago; still having aching and throbbing pain. FINDINGS: BONES AND JOINTS: Glenohumeral joint is normally aligned. No acute fracture. No malalignment. The [facility] joint is unremarkable. Incidental benign bone island in the humeral head. SOFT TISSUES: No abnormal calcifications. Visualized lung is unremarkable. IMPRESSION: 1. No acute fracture or dislocation. Electronically signed by: Ryan Salvage MD 02/28/2024 10:23 AM EST RP Workstation: [id number]    Procedures Procedures (including critical care time)  Medications Ordered in UC Medications - No data to display  Initial Impression / Assessment and Plan / UC Course  I have reviewed the triage vital signs and the nursing notes.  Pertinent labs & imaging results that were available during my care of the patient were reviewed by me and considered  in my medical decision making (see chart for details).     X-ray of the right shoulder negative for acute bony abnormality.  Suspect contusion from fall.  Treat with Zanaflex , over-the-counter pain relievers, heat, massage, stretches, rest.  Return for worsening or unresolving symptoms.  Final Clinical Impressions(s) / UC Diagnoses   Final diagnoses:  Acute pain of right shoulder  Fall, initial encounter     Discharge Instructions      In addition to the muscle relaxer prescribed as needed, you may alternate ibuprofen  and Tylenol  as needed for pain, use heat, massage, muscle rubs, rest.  Your x-ray did not show any bony injury which is great news    ED Prescriptions     Medication Sig Dispense Auth. Provider   tiZANidine  (ZANAFLEX ) 4 MG tablet Take 1 tablet (4 mg total) by mouth every 8 (eight) hours as needed for muscle spasms. Do not drink alcohol or drive while taking this medication.  May cause drowsiness. 15 tablet Stuart Vernell Norris, [place]      PDMP not reviewed this encounter.    [1]  Social History Tobacco Use   Smoking status: Never   Smokeless tobacco: Never  Vaping Use   Vaping status: Never Used  Substance Use Topics   Alcohol use: No   Drug use: No     Stuart Vernell Norris, PA-C 02/28/24 1044  "

## 2024-02-28 NOTE — Telephone Encounter (Signed)
-----   Message from Thersia Bitters Caudle sent at 02/27/2024 10:07 AM EST ----- Regarding: Labs Could you fax lab orders tomorrow AM to [facility] Location for patient?   [facility] [address] [place], [place] [address]  Phone : [phone] Fax: [phone]

## 2024-02-28 NOTE — ED Triage Notes (Signed)
 Pt reports right shoulder pain after slipping and falling on the ice 2 days ago. Denies any head injury during the fall.

## 2024-03-01 ENCOUNTER — Ambulatory Visit (HOSPITAL_BASED_OUTPATIENT_CLINIC_OR_DEPARTMENT_OTHER): Admitting: Family Medicine

## 2024-03-01 LAB — COMPREHENSIVE METABOLIC PANEL WITH GFR
ALT: 16 [IU]/L (ref 0–32)
AST: 17 [IU]/L (ref 0–40)
Albumin: 4.1 g/dL (ref 3.9–4.9)
Alkaline Phosphatase: 58 [IU]/L (ref 41–116)
BUN/Creatinine Ratio: 14 (ref 9–23)
BUN: 11 mg/dL (ref 6–20)
Bilirubin Total: 0.4 mg/dL (ref 0.0–1.2)
CO2: 21 mmol/L (ref 20–29)
Calcium: 9.9 mg/dL (ref 8.7–10.2)
Chloride: 108 mmol/L — ABNORMAL HIGH (ref 96–106)
Creatinine, Ser: 0.76 mg/dL (ref 0.57–1.00)
Globulin, Total: 3 g/dL (ref 1.5–4.5)
Glucose: 101 mg/dL — ABNORMAL HIGH (ref 70–99)
Potassium: 4.3 mmol/L (ref 3.5–5.2)
Sodium: 142 mmol/L (ref 134–144)
Total Protein: 7.1 g/dL (ref 6.0–8.5)
eGFR: 105 mL/min/{1.73_m2}

## 2024-03-01 LAB — LIPID PANEL
Chol/HDL Ratio: 4.3 ratio (ref 0.0–4.4)
Cholesterol, Total: 108 mg/dL (ref 100–199)
HDL: 25 mg/dL — ABNORMAL LOW
LDL Chol Calc (NIH): 62 mg/dL (ref 0–99)
Triglycerides: 114 mg/dL (ref 0–149)
VLDL Cholesterol Cal: 21 mg/dL (ref 5–40)

## 2024-03-01 LAB — HEMOGLOBIN A1C
Est. average glucose Bld gHb Est-mCnc: 151 mg/dL
Hgb A1c MFr Bld: 6.9 % — ABNORMAL HIGH (ref 4.8–5.6)

## 2024-03-01 LAB — MICROALBUMIN / CREATININE URINE RATIO
Creatinine, Urine: 180.3 mg/dL
Microalb/Creat Ratio: 6 mg/g{creat} (ref 0–29)
Microalbumin, Urine: 10.9 ug/mL

## 2024-03-02 ENCOUNTER — Ambulatory Visit (HOSPITAL_BASED_OUTPATIENT_CLINIC_OR_DEPARTMENT_OTHER): Payer: Self-pay | Admitting: Family Medicine

## 2024-03-02 NOTE — Progress Notes (Signed)
 Hi Gabrielle, Good news, your triglycerides have normalized and your good cholesterol (HDL) has started to improve.  Your glucose is also down to 100 which is excellent and your A1c is considered under control at 6.9.  Your urine function and kidney function is normal.  At this time, I would not recommend starting a medication for your triglycerides as getting your diabetes under control has alleviated this problem.  If you are still interested in transitioning to Mounjaro , we would stop your Ozempic  and start the Mounjaro  at a starting dosage of 2.5 mg once a week.  Please let me know if you are interested in this.
